# Patient Record
Sex: Male | Born: 1955 | Race: White | Hispanic: No | Marital: Married | Smoking: Former smoker
Health system: Southern US, Community
[De-identification: ages and names within clinical notes are randomized; demographics above are authoritative.]

## PROBLEM LIST (undated history)

## (undated) ENCOUNTER — Ambulatory Visit: Admission: EM | Payer: Medicare Other

## (undated) ENCOUNTER — Ambulatory Visit: Discharge status: Absent without leave | Payer: Medicare Other

## (undated) DIAGNOSIS — J439 Emphysema, unspecified: Secondary | ICD-10-CM

## (undated) DIAGNOSIS — I7090 Unspecified atherosclerosis: Secondary | ICD-10-CM

## (undated) DIAGNOSIS — J189 Pneumonia, unspecified organism: Secondary | ICD-10-CM

## (undated) DIAGNOSIS — I708 Atherosclerosis of other arteries: Secondary | ICD-10-CM

## (undated) HISTORY — PX: APPENDECTOMY: SHX54

## (undated) HISTORY — PX: KNEE SURGERY: SHX244

---

## 2004-06-12 ENCOUNTER — Emergency Department (HOSPITAL_COMMUNITY): Admission: EM | Admit: 2004-06-12 | Discharge: 2004-06-12 | Payer: Self-pay | Admitting: Emergency Medicine

## 2004-06-25 ENCOUNTER — Ambulatory Visit (HOSPITAL_COMMUNITY): Admission: RE | Admit: 2004-06-25 | Discharge: 2004-06-25 | Payer: Self-pay | Admitting: Orthopaedic Surgery

## 2004-07-28 ENCOUNTER — Ambulatory Visit (HOSPITAL_COMMUNITY): Admission: RE | Admit: 2004-07-28 | Discharge: 2004-07-28 | Payer: Self-pay | Admitting: Orthopaedic Surgery

## 2004-08-16 ENCOUNTER — Encounter: Admission: RE | Admit: 2004-08-16 | Discharge: 2004-11-14 | Payer: Self-pay | Admitting: Orthopaedic Surgery

## 2012-02-19 ENCOUNTER — Emergency Department (HOSPITAL_COMMUNITY)
Admission: EM | Admit: 2012-02-19 | Discharge: 2012-02-19 | Disposition: A | Discharge status: Home / Self Care | Payer: Self-pay | Attending: Emergency Medicine | Admitting: Emergency Medicine

## 2012-02-19 ENCOUNTER — Encounter (HOSPITAL_COMMUNITY): Payer: Self-pay

## 2012-02-19 DIAGNOSIS — Z8614 Personal history of Methicillin resistant Staphylococcus aureus infection: Secondary | ICD-10-CM | POA: Insufficient documentation

## 2012-02-19 DIAGNOSIS — L02419 Cutaneous abscess of limb, unspecified: Secondary | ICD-10-CM | POA: Insufficient documentation

## 2012-02-19 DIAGNOSIS — Z9889 Other specified postprocedural states: Secondary | ICD-10-CM | POA: Insufficient documentation

## 2012-02-19 DIAGNOSIS — L0291 Cutaneous abscess, unspecified: Secondary | ICD-10-CM

## 2012-02-19 DIAGNOSIS — L03119 Cellulitis of unspecified part of limb: Secondary | ICD-10-CM | POA: Insufficient documentation

## 2012-02-19 DIAGNOSIS — F17200 Nicotine dependence, unspecified, uncomplicated: Secondary | ICD-10-CM | POA: Insufficient documentation

## 2012-02-19 MED ORDER — OXYCODONE-ACETAMINOPHEN 5-325 MG PO TABS
2.0000 | ORAL_TABLET | Freq: Once | ORAL | Status: AC
  Administered 2012-02-19: 2 via ORAL
  Filled 2012-02-19: qty 2

## 2012-02-19 MED ORDER — OXYCODONE-ACETAMINOPHEN 5-325 MG PO TABS: 1.0000 | ORAL_TABLET | ORAL | Status: DC | PRN

## 2012-02-19 MED ORDER — SULFAMETHOXAZOLE-TRIMETHOPRIM 800-160 MG PO TABS: 1.0000 | ORAL_TABLET | Freq: Two times a day (BID) | ORAL | Status: DC

## 2012-02-19 MED ORDER — LIDOCAINE HCL (PF) 1 % IJ SOLN
5.0000 mL | Freq: Once | INTRAMUSCULAR | Status: DC
  Filled 2012-02-19: qty 5

## 2012-02-19 MED ORDER — SULFAMETHOXAZOLE-TMP DS 800-160 MG PO TABS
1.0000 | ORAL_TABLET | Freq: Once | ORAL | Status: AC
  Administered 2012-02-19: 1 via ORAL
  Filled 2012-02-19: qty 1

## 2012-02-19 NOTE — ED Notes (Signed)
Patient with no complaints at this time. Respirations even and unlabored. Skin warm/dry. Discharge instructions reviewed with patient at this time. Patient given opportunity to voice concerns/ask questions. Patient discharged at this time and left Emergency Department with steady gait.   

## 2012-02-19 NOTE — ED Notes (Signed)
Suture cart at bedside 

## 2012-02-19 NOTE — ED Provider Notes (Signed)
History     CSN: [id number]  Arrival date & time 02/19/12  2956   First MD Initiated Contact with Patient 02/19/12 [phone]      Chief Complaint  Patient presents with  . Knee Pain    (Consider location/radiation/quality/duration/timing/severity/associated sxs/prior treatment) HPI Comments: Patient c/o red, painful swollen area to the left knee.  States the area started as a small "bump" that progressed.  He denies previus hx of abscesses or MRSA, fever or difficulty bending  His knee.  Patient is a 57 y.o. male presenting with abscess. The history is provided by the patient.  Abscess  This is a new problem. The current episode started less than one week ago. The onset was gradual. The problem occurs continuously. The problem has been gradually worsening. Affected Location: left knee. The problem is moderate. The abscess is characterized by painfulness, swelling and redness. It is unknown what he was exposed to. Pertinent negatives include no decrease in physical activity, not sleeping less, no fever and no vomiting. His past medical history does not include skin abscesses in family. There were no sick contacts. He has received no recent medical care.    History reviewed. No pertinent past medical history.  Past Surgical History  Procedure Date  . Knee surgery   . Appendectomy     No family history on file.  History  Substance Use Topics  . Smoking status: Current Every Day Smoker  . Smokeless tobacco: Not on file  . Alcohol Use: No      Review of Systems  Constitutional: Negative for fever and chills.  Gastrointestinal: Negative for nausea and vomiting.  Musculoskeletal: Negative for joint swelling and arthralgias.  Skin: Positive for color change.       Abscess   Hematological: Negative for adenopathy.  All other systems reviewed and are negative.    Allergies  Review of patient's allergies indicates no known allergies.  Home Medications   Current Outpatient Rx    Name  Route  Sig  Dispense  Refill  . ACETAMINOPHEN 500 MG PO TABS   Oral   Take 1,000 mg by mouth every 6 (six) hours as needed. Pain           BP 124/76  Pulse 91  Temp 97.6 F (36.4 C) (Oral)  Resp 16  Ht 5\' 7"  (1.702 m)  Wt 143 lb (64.864 kg)  BMI 22.40 kg/m2  SpO2 97%  Physical Exam  Nursing note and vitals reviewed. Constitutional: He is oriented to person, place, and time. He appears well-developed and well-nourished. No distress.  HENT:  Head: Normocephalic and atraumatic.  Cardiovascular: Normal rate, regular rhythm and normal heart sounds.   Pulmonary/Chest: Effort normal and breath sounds normal.  Musculoskeletal: He exhibits tenderness.       Left knee: He exhibits swelling. He exhibits normal range of motion, no effusion, no ecchymosis, no deformity and no laceration. tenderness found.       Legs: Neurological: He is alert and oriented to person, place, and time. He exhibits normal muscle tone. Coordination normal.  Skin: Skin is warm.       See MS exam    ED Course  Procedures (including critical care time)   Labs Reviewed  CULTURE, ROUTINE-ABSCESS     Abscess culture is pending   MDM   INCISION AND DRAINAGE Performed by: Maxwell Caul. Consent: Verbal consent obtained. Risks and benefits: risks, benefits and alternatives were discussed Type: abscess  Body area: left knee Anesthesia: local infiltration  Incision was made with a #11 blade scalpel.  Local anesthetic: lidocaine 1% w/o epinephrine  Anesthetic total: 3 ml  Complexity: complex Blunt dissection to break up loculations  Drainage: purulent  Drainage amount: small  Packing material: 1/4 in iodoform gauze  Patient tolerance: Patient tolerated the procedure well with no immediate complications.     pt given percocet and Bactrim DS in the dept   Patient agrees to elevate his leg.  Minimal walking.  Return here in 2 days for recheck and packing  removal  Prescribed: Percocet #20 Bactrim DS   Frans Valente L. Kellee Sittner, [place] 02/21/12 1155

## 2012-02-19 NOTE — ED Notes (Signed)
Patient with what appears to be an abscess to left lateral knee.

## 2012-02-19 NOTE — ED Notes (Signed)
Pt was told he has "gout in my knees" knot to left knee present. No drainage present. Area red. Painful to touch. Ambulation does not affect knee

## 2012-02-21 NOTE — ED Provider Notes (Signed)
Medical screening examination/treatment/procedure(s) were performed by non-physician practitioner and as supervising physician I was immediately available for consultation/collaboration.   Shelda Jakes, MD 02/21/12 [phone]

## 2012-02-22 LAB — CULTURE, ROUTINE-ABSCESS

## 2012-02-22 NOTE — ED Notes (Signed)
Pt called to office to check on results of culture of knee. Informed of mrsa infection of wound, instructed in wound care and hand washing. States Dr Clarene Duke in [place] changed his med because he is allergic to sulfa. Instructed pt that I would fax copy of wound culture results and sensitivity to Dr Clarene Duke and he can follow up with him later today. Talked with Shanda Bumps at Dr Fredirick Maudlin [phone]) office and faxed report to [phone].

## 2012-02-25 ENCOUNTER — Encounter (HOSPITAL_COMMUNITY): Payer: Self-pay | Admitting: *Deleted

## 2012-02-25 ENCOUNTER — Emergency Department (HOSPITAL_COMMUNITY): Payer: Self-pay

## 2012-02-25 ENCOUNTER — Emergency Department (HOSPITAL_COMMUNITY)
Admission: EM | Admit: 2012-02-25 | Discharge: 2012-02-25 | Disposition: A | Discharge status: Home / Self Care | Payer: Self-pay | Attending: Emergency Medicine | Admitting: Emergency Medicine

## 2012-02-25 DIAGNOSIS — Z8614 Personal history of Methicillin resistant Staphylococcus aureus infection: Secondary | ICD-10-CM | POA: Insufficient documentation

## 2012-02-25 DIAGNOSIS — F17200 Nicotine dependence, unspecified, uncomplicated: Secondary | ICD-10-CM | POA: Insufficient documentation

## 2012-02-25 DIAGNOSIS — M25569 Pain in unspecified knee: Secondary | ICD-10-CM | POA: Insufficient documentation

## 2012-02-25 DIAGNOSIS — Z9889 Other specified postprocedural states: Secondary | ICD-10-CM | POA: Insufficient documentation

## 2012-02-25 DIAGNOSIS — L02419 Cutaneous abscess of limb, unspecified: Secondary | ICD-10-CM | POA: Insufficient documentation

## 2012-02-25 MED ORDER — OXYCODONE-ACETAMINOPHEN 5-325 MG PO TABS
2.0000 | ORAL_TABLET | Freq: Once | ORAL | Status: AC
  Administered 2012-02-25: 2 via ORAL
  Filled 2012-02-25: qty 2

## 2012-02-25 NOTE — ED Provider Notes (Signed)
History   This chart was scribed for non-physician practitioner working with Gerhard Munch, MD by Frederik Pear, ED Scribe. This patient was seen in room WTR8/WTR8 and the patient's care was started at 1919.   CSN: [id number]  Arrival date & time 02/25/12  1858   First MD Initiated Contact with Patient 02/25/12 1919      Chief Complaint  Patient presents with  . Recurrent Skin Infections    (Consider location/radiation/quality/duration/timing/severity/associated sxs/prior treatment) HPI  Aaron Stevens is a 57 y.o. male who presents to the Emergency Department with a chief complaint of a gradually worsening skin infection to the left nare that appeared recently. His wife reports that he went to his PCP, Dr. Clarene Duke, in [place] for an abscess on his right knee a few weeks ago and was diagnosed with gout. Then, an abscess appeared on the left knee a few days later. He was seen at [facility] in the ED 6 days ago by the PA and it was I&D'd. A culture determined that it was MRSA. He was given a course of sulfa antibiotics, but his wife reports that his lips began swelling after he began the medication so they returned to Dr. Clarene Duke after the results of the culture had been returned, and he switched the antibiotics. He reports that tonight in the ED he is still experiencing right-sided burning knee pain, and that the left knee abscess is still draining. His wife reports that he has not been taking pain medication and that they left the patient's pain medicine at Dr. Fredirick Maudlin office.  Dr. Clarene Duke also switched the patient's antibiotics.   History reviewed. No pertinent past medical history.  Past Surgical History  Procedure Date  . Knee surgery   . Appendectomy     History reviewed. No pertinent family history.  History  Substance Use Topics  . Smoking status: Current Every Day Smoker  . Smokeless tobacco: Not on file  . Alcohol Use: No      Review of Systems A complete 10 system  review of systems was obtained and all systems are negative except as noted in the HPI and PMH.   Allergies  Sulfa antibiotics  Home Medications   Current Outpatient Rx  Name  Route  Sig  Dispense  Refill  . ACETAMINOPHEN 500 MG PO TABS   Oral   Take 1,000 mg by mouth every 6 (six) hours as needed. Pain           BP 127/72  Pulse 88  Temp 97.7 F (36.5 C) (Oral)  Resp 16  SpO2 95%  Physical Exam  Nursing note and vitals reviewed. Constitutional: He is oriented to person, place, and time. He appears well-developed and well-nourished. No distress.  HENT:  Head: Normocephalic and atraumatic.  Eyes: EOM are normal. Pupils are equal, round, and reactive to light.  Neck: Normal range of motion. Neck supple. No tracheal deviation present.  Cardiovascular: Normal rate.   Pulmonary/Chest: Effort normal. No respiratory distress.  Abdominal: Soft. He exhibits no distension.  Musculoskeletal: Normal range of motion. He exhibits no edema.       Right knee well healing abscess on the medial aspect. No warmth, redness, signs of acute infection. Left knee 3x3 cm draining abscess on the lateral aspect.  Neurological: He is alert and oriented to person, place, and time.  Skin: Skin is warm and dry.  Psychiatric: He has a normal mood and affect. His behavior is normal.    ED Course  Procedures (  including critical care time)  DIAGNOSTIC STUDIES: Oxygen Saturation is 95% on room air, adequate by my interpretation.    COORDINATION OF CARE:  19:25- Discussed planned course of treatment with the patient, including Percocet and right knee X-rays, who is agreeable at this time.  Results for orders placed during the hospital encounter of 02/19/12  CULTURE, ROUTINE-ABSCESS      Component Value Range   Specimen Description ABSCESS LEFT KNEE     Special Requests NONE     Gram Stain       Value: NO WBC SEEN     RARE SQUAMOUS EPITHELIAL CELLS PRESENT     FEW GRAM POSITIVE COCCI IN PAIRS    Culture       Value: MODERATE METHICILLIN RESISTANT STAPHYLOCOCCUS AUREUS     Note: RIFAMPIN AND GENTAMICIN SHOULD NOT BE USED AS SINGLE DRUGS FOR TREATMENT OF STAPH INFECTIONS. This organism DOES NOT demonstrate inducible Clindamycin resistance in vitro. CRITICAL RESULT CALLED TO, READ BACK BY AND VERIFIED WITH: TONI FESTERMAN      02/22/12 1010 BY SMITHERSJ   Report Status 02/22/2012 FINAL     Organism ID, Bacteria METHICILLIN RESISTANT STAPHYLOCOCCUS AUREUS     Dg Knee Complete 4 Views Right  02/25/2012  *RADIOLOGY REPORT*  Clinical Data: 57 year old male with right knee pain and swelling.  RIGHT KNEE - COMPLETE 4+ VIEW  Comparison: None  Findings: There is no evidence of acute fracture, subluxation or dislocation. The joint spaces are unremarkable. There is no evidence of joint effusion. No focal bony lesions are present. A remote proximal fibular fracture is noted.  IMPRESSION: No evidence of acute abnormality.   Original Report Authenticated By: Harmon Pier, M.D.       1. Knee pain       MDM  57 year old male with knee pain. Also with recurrent skin infections. The abscess seemed to be healing well, will advise patient to continue his current medications. Advised the patient to followup with his primary care doctor tomorrow morning if he is still having pain. Patient still has an active prescription for Percocet, and according to hospital policy I will not give a refill of Percocet. Patient's pain has been managed here however.  I personally performed the services described in this documentation, which was scribed in my presence. The recorded information has been reviewed and is accurate.          Roxy Horseman, PA-C 02/25/12 2015

## 2012-02-25 NOTE — ED Provider Notes (Signed)
Medical screening examination/treatment/procedure(s) were performed by non-physician practitioner and as supervising physician I was immediately available for consultation/collaboration.  Keshona Kartes, MD 02/25/12 2345 

## 2012-02-25 NOTE — ED Notes (Signed)
Pt has a ride home.  

## 2012-02-25 NOTE — ED Notes (Addendum)
Patient with ? Skin abcess to his nose.  Patient was seen at [facility] and diagnosed with MRSA and his left knee was I&D.  Today, he is here because he has a possible same infection in his nose.  Patient is having a lot of pain and states that he was Rx oxycodone but left it at his MD's office this week when he was there this week.

## 2014-03-05 ENCOUNTER — Encounter (HOSPITAL_COMMUNITY): Payer: Self-pay | Admitting: *Deleted

## 2014-03-05 ENCOUNTER — Emergency Department (HOSPITAL_COMMUNITY): Payer: Worker's Compensation

## 2014-03-05 ENCOUNTER — Emergency Department (HOSPITAL_COMMUNITY)
Admission: EM | Admit: 2014-03-05 | Discharge: 2014-03-05 | Disposition: A | Discharge status: Home / Self Care | Payer: Worker's Compensation | Attending: Emergency Medicine | Admitting: Emergency Medicine

## 2014-03-05 DIAGNOSIS — S8991XA Unspecified injury of right lower leg, initial encounter: Secondary | ICD-10-CM | POA: Diagnosis not present

## 2014-03-05 DIAGNOSIS — W01198A Fall on same level from slipping, tripping and stumbling with subsequent striking against other object, initial encounter: Secondary | ICD-10-CM | POA: Insufficient documentation

## 2014-03-05 DIAGNOSIS — S42401A Unspecified fracture of lower end of right humerus, initial encounter for closed fracture: Secondary | ICD-10-CM

## 2014-03-05 DIAGNOSIS — Y9289 Other specified places as the place of occurrence of the external cause: Secondary | ICD-10-CM | POA: Insufficient documentation

## 2014-03-05 DIAGNOSIS — Y990 Civilian activity done for income or pay: Secondary | ICD-10-CM | POA: Diagnosis not present

## 2014-03-05 DIAGNOSIS — R200 Anesthesia of skin: Secondary | ICD-10-CM | POA: Insufficient documentation

## 2014-03-05 DIAGNOSIS — Z720 Tobacco use: Secondary | ICD-10-CM | POA: Diagnosis not present

## 2014-03-05 DIAGNOSIS — T148XXA Other injury of unspecified body region, initial encounter: Secondary | ICD-10-CM

## 2014-03-05 DIAGNOSIS — Y9389 Activity, other specified: Secondary | ICD-10-CM | POA: Insufficient documentation

## 2014-03-05 DIAGNOSIS — S59901A Unspecified injury of right elbow, initial encounter: Secondary | ICD-10-CM | POA: Diagnosis present

## 2014-03-05 MED ORDER — HYDROCODONE-ACETAMINOPHEN 5-325 MG PO TABS: 1.0000 | ORAL_TABLET | ORAL | Status: DC | PRN

## 2014-03-05 MED ORDER — HYDROCODONE-ACETAMINOPHEN 5-325 MG PO TABS
1.0000 | ORAL_TABLET | Freq: Once | ORAL | Status: AC
  Administered 2014-03-05: 1 via ORAL
  Filled 2014-03-05: qty 1

## 2014-03-05 NOTE — ED Notes (Signed)
Then pt fell tonight mat work [employer].  He fell in the freezer.  He has rt elbow and rt knee pain

## 2014-03-05 NOTE — ED Provider Notes (Signed)
CSN: [id number]     Arrival date & time 03/05/14  0155 History  This chart was scribed for Loren Raceravid Anhelica Fowers, MD by Bronson CurbJacqueline Melvin, ED Scribe. This patient was seen in room D34C/D34C and the patient's care was started at 2:10 AM.   Chief Complaint  Patient presents with  . Fall    The history is provided by the patient. No language interpreter was used.     HPI Comments: Aaron RoysRonnie Stevens is a 59 y.o. male, with no significant medical history, who presents to the Emergency Department complaining of a fall that occurred 2 hours ago. Patient states he was at work when he tripped and fell onto a hard surface, landing on his right elbow and right knee. He denies head injury or LOC. There is associated right elbow pain, right knee pain, and numbness in right wrist. He shoulder pain, neck pain, wrist pain, or any other injuries.   History reviewed. No pertinent past medical history. Past Surgical History  Procedure Laterality Date  . Knee surgery    . Appendectomy     No family history on file. History  Substance Use Topics  . Smoking status: Current Every Day Smoker  . Smokeless tobacco: Not on file  . Alcohol Use: No    Review of Systems  Constitutional: Negative for fever and chills.  Musculoskeletal: Positive for arthralgias. Negative for neck pain.  Neurological: Positive for numbness. Negative for syncope and headaches.  All other systems reviewed and are negative.     Allergies  Sulfa antibiotics  Home Medications   Prior to Admission medications   Medication Sig Start Date End Date Taking? Authorizing Provider  acetaminophen (TYLENOL) 500 MG tablet Take 1,000 mg by mouth every 6 (six) hours as needed. Pain    Historical Provider, MD  PRESCRIPTION MEDICATION antibiotic    Historical Provider, MD   Triage Vitals: BP 134/69 mmHg  Pulse 70  Temp(Src) 97.7 F (36.5 C)  Resp 18  Ht 5\' 6"  (1.676 m)  Wt 135 lb (61.236 kg)  BMI 21.80 kg/m2  SpO2 98%  Physical Exam   Constitutional: He is oriented to person, place, and time. He appears well-developed and well-nourished. No distress.  HENT:  Head: Normocephalic and atraumatic.  Eyes: Conjunctivae and EOM are normal.  Neck: Neck supple. No tracheal deviation present.  Cardiovascular: Normal rate.   Pulmonary/Chest: Effort normal. No respiratory distress.  Musculoskeletal: Normal range of motion.  Neurological: He is alert and oriented to person, place, and time.  Skin: Skin is warm and dry.  Psychiatric: He has a normal mood and affect. His behavior is normal.  Nursing note and vitals reviewed.   ED Course  Procedures (including critical care time)  DIAGNOSTIC STUDIES: Oxygen Saturation is 98% on room air, normal by my interpretation.    COORDINATION OF CARE: At 0212 Discussed treatment plan with patient which includes imaging. Patient agrees.   At 0302 Discussed with patient imaging results which revealed positive findings for avualsion fracture in the right elbow. Discussed treatment plan with patient which includes shoulder sling and ortho f/u. Patient agrees.  Labs Review Labs Reviewed - No data to display  Imaging Review Dg Elbow Complete Right  03/05/2014   CLINICAL DATA:  Larey SeatFell on concrete floor, acute injury, initial evaluation. Posterior elbow pain.  EXAM: RIGHT ELBOW - COMPLETE 3+ VIEW  COMPARISON:  None.  FINDINGS: Tiny bony fragment projects posterior to the olecranon, with underlying apparent donor site. No dislocation. No destructive bony lesions. Olecranon  soft tissue swelling without subcutaneous gas or radiopaque foreign bodies.  IMPRESSION: Tiny suspected avulsion fragment of the olecranon without dislocation.   Electronically Signed   By: Awilda Metro   On: 03/05/2014 02:45   Dg Knee 2 Views Right  03/05/2014   CLINICAL DATA:  Larey Seat on concrete floor, acute injury, initial evaluation. Anterior knee pain.  EXAM: RIGHT KNEE - 1-2 VIEW  COMPARISON:  RIGHT knee radiographs February 25, 2012  FINDINGS: There is no evidence of fracture, dislocation, or joint effusion. There is no evidence of arthropathy or other focal bone abnormality. Soft tissues are unremarkable.  IMPRESSION: Negative.   Electronically Signed   By: Awilda Metro   On: 03/05/2014 02:47     EKG Interpretation None      MDM   Final diagnoses:  None   I personally performed the services described in this documentation, which was scribed in my presence. The recorded information has been reviewed and is accurate.     Loren Racer, MD 03/12/14 [phone]

## 2014-03-05 NOTE — Discharge Instructions (Signed)
Call and make an appointment to follow-up with the orthopedist.  Elbow Fracture, Simple A fracture is a break in one of the bones.When fractures are not displaced or separated, they may be treated with only a sling or splint. The sling or splint may only be required for two to three weeks. In these cases, often the elbow is put through early range of motion exercises to prevent the elbow from getting stiff. DIAGNOSIS  The diagnosis (learning what is wrong) of a fractured elbow is made by x-ray. These may be required before and after the elbow is put into a splint or cast. X-rays are taken after to make sure the bone pieces have not moved. HOME CARE INSTRUCTIONS   Only take over-the-counter or prescription medicines for pain, discomfort, or fever as directed by your caregiver.  If you have a splint held on with an elastic wrap and your hand or fingers become numb or cold and blue, loosen the wrap and reapply more loosely. See your caregiver if there is no relief.  You may use ice for twenty minutes, four times per day, for the first two to three days.  Use your elbow as directed.  See your caregiver as directed. It is very important to keep all follow-up referrals and appointments in order to avoid any long-term problems with your elbow including chronic pain or stiffness. SEEK IMMEDIATE MEDICAL CARE IF:   There is swelling or increasing pain in elbow.  You begin to lose feeling or experience numbness or tingling in your hand or fingers.  You develop swelling of the hand and fingers.  You get a cold or blue hand or fingers on affected side. MAKE SURE YOU:   Understand these instructions.  Will watch your condition.  Will get help right away if you are not doing well or get worse. Document Released: 01/10/2001 Document Revised: 04/10/2011 Document Reviewed: 12/01/2008 [facility] Patient Information 2015 [employer], [place]. This information is not intended to replace advice given to you by  your health care provider. Make sure you discuss any questions you have with your health care provider.

## 2014-03-05 NOTE — ED Notes (Signed)
Patient is alert and orientedx4.  Patient was explained discharge instructions and they understood them with no questions.  The patient's wife, Kandace BlitzKaren Gero is taking the patient  Home.

## 2014-03-05 NOTE — ED Notes (Signed)
Family at bedside. 

## 2014-05-12 DIAGNOSIS — H2511 Age-related nuclear cataract, right eye: Secondary | ICD-10-CM | POA: Insufficient documentation

## 2015-01-31 ENCOUNTER — Emergency Department (HOSPITAL_COMMUNITY)
Admission: EM | Admit: 2015-01-31 | Discharge: 2015-01-31 | Disposition: A | Discharge status: Home / Self Care | Payer: Self-pay

## 2015-01-31 ENCOUNTER — Encounter (HOSPITAL_COMMUNITY): Payer: Self-pay | Admitting: Family Medicine

## 2015-01-31 ENCOUNTER — Emergency Department (HOSPITAL_COMMUNITY): Payer: 59

## 2015-01-31 ENCOUNTER — Emergency Department (HOSPITAL_COMMUNITY)
Admission: EM | Admit: 2015-01-31 | Discharge: 2015-01-31 | Disposition: A | Discharge status: Home / Self Care | Payer: 59 | Attending: Emergency Medicine | Admitting: Emergency Medicine

## 2015-01-31 DIAGNOSIS — F17210 Nicotine dependence, cigarettes, uncomplicated: Secondary | ICD-10-CM | POA: Diagnosis not present

## 2015-01-31 DIAGNOSIS — R52 Pain, unspecified: Secondary | ICD-10-CM

## 2015-01-31 DIAGNOSIS — M25511 Pain in right shoulder: Secondary | ICD-10-CM | POA: Insufficient documentation

## 2015-01-31 MED ORDER — DEXAMETHASONE SODIUM PHOSPHATE 10 MG/ML IJ SOLN
10.0000 mg | Freq: Once | INTRAMUSCULAR | Status: AC
Start: 2015-01-31 — End: 2015-01-31
  Administered 2015-01-31: 10 mg via INTRAMUSCULAR
  Filled 2015-01-31: qty 1

## 2015-01-31 MED ORDER — KETOROLAC TROMETHAMINE 30 MG/ML IJ SOLN: 30.0000 mg | Freq: Once | INTRAMUSCULAR | Status: DC

## 2015-01-31 MED ORDER — KETOROLAC TROMETHAMINE 30 MG/ML IJ SOLN
30.0000 mg | Freq: Once | INTRAMUSCULAR | Status: AC
  Administered 2015-01-31: 30 mg via INTRAMUSCULAR
  Filled 2015-01-31: qty 1

## 2015-01-31 MED ORDER — NAPROXEN 500 MG PO TABS: 500.0000 mg | ORAL_TABLET | Freq: Two times a day (BID) | ORAL | Status: DC

## 2015-01-31 NOTE — ED Provider Notes (Signed)
CSN: [id number]     Arrival date & time 01/31/15  1900 History  By signing my name below, I, Aaron Stevens, attest that this documentation has been prepared under the direction and in the presence of Cheri Fowler, PA-C. Electronically Signed: Phillis Stevens, ED Scribe. 01/31/2015. 8:00 PM.  Chief Complaint  Patient presents with  . Shoulder Pain   The history is provided by the patient. No language interpreter was used.   HPI Comments: Aaron Stevens is a 60 y.o. male with a hx of emphysema who presents to the Emergency Department complaining of aching, non-radiating, right shoulder pain that he rates 7/10 onset ~3-4 month ago, progressively worsening today. 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 Pt has been taking Tylenol Extra Strength to no relief. Pt has a PCP but has not been seen by PCP for this problem. He denies hx of similar symptoms. Pt denies fever, chills, chest pain, SOB, joint swelling, numbness, weakness, wound, color change, recent injury or fall.   PCP: Aida Puffer, MD  History reviewed. No pertinent past medical history. Past Surgical History  Procedure Laterality Date  . Knee surgery    . Appendectomy     History reviewed. No pertinent family history. Social History  Substance Use Topics  . Smoking status: Current Every Day Smoker -- 1.00 packs/day    Types: Cigarettes  . Smokeless tobacco: None  . Alcohol Use: No    Review of Systems  Constitutional: Negative for fever and chills.  Musculoskeletal: Positive for arthralgias. Negative for joint swelling.  Skin: Negative for color change and wound.  Neurological: Negative for weakness and numbness.   Allergies  Sulfa antibiotics  Home Medications   Prior to Admission medications   Medication Sig Start Date End Date Taking? Authorizing Provider  acetaminophen (TYLENOL) 500 MG tablet Take 1,000 mg by mouth every 6 (six) hours as needed. Pain    Historical Provider, MD   HYDROcodone-acetaminophen (NORCO) 5-325 MG per tablet Take 1 tablet by mouth every 4 (four) hours as needed. 03/05/14   Loren Racer, MD  naproxen (NAPROSYN) 500 MG tablet Take 1 tablet (500 mg total) by mouth 2 (two) times daily. 01/31/15   Cheri Fowler, PA-C  PRESCRIPTION MEDICATION antibiotic    Historical Provider, MD   BP 123/79 mmHg  Pulse 80  Temp(Src) 98 F (36.7 C) (Oral)  Resp 18  Ht 5\' 7"  (1.702 m)  Wt 65.772 kg  BMI 22.71 kg/m2  SpO2 99% Physical Exam  Constitutional: He is oriented to person, place, and time. He appears well-developed and well-nourished.  HENT:  Head: Atraumatic.  Eyes: Conjunctivae are normal.  Cardiovascular: Intact distal pulses.   Pulses:      Radial pulses are 2+ on the right side, and 2+ on the left side.  Capillary refill <3 seconds  Pulmonary/Chest: Effort normal and breath sounds normal.  Musculoskeletal:  Right shoulder:  No obvious deformities, ecchymosis, or abrasion.  No edema.  No crepitus.  No TTP along sternoclavicular joint, clavicle, coracoid process, or humerus.  Severe TTP along AC joint.  Compartment is soft and compressible.  No tenderness along long head of biceps or trapezius.  Full ROM to include forward flexion, extension, internal/external rotation, and abduction/adduction; however it does elicit pain.  No pain with resisted supination.  -Positive empty can test -Strength intact with external rotation and internal rotation -Positive Hawkin's   Neurological: He is alert and oriented to person, place, and time.  5/5  strength throughout.  Sensation intact.   Skin: Skin is warm and dry.    ED Course  Procedures (including critical care time) DIAGNOSTIC STUDIES: Oxygen Saturation is 99% on RA, normal by my interpretation.    COORDINATION OF CARE: 7:34 PM-Discussed treatment plan which includes x-ray with pt at bedside and pt agreed to plan.    Labs Review Labs Reviewed - No data to display  Imaging Review No results  found. I have personally reviewed and evaluated these images and lab results as part of my medical decision-making.   EKG Interpretation None      MDM   Final diagnoses:  Right shoulder pain    Patient presents with right shoulder pain for the past 3-4 months unrelieved by tylenol. He lifts 20-30 lb boxes at work, and lots of repetitive motions. No injury/trauma.  No numbness, weakness, tingling, fever, neck pain, CP, SOB.  VSS, NAD.  On exam, severe TTP along AC joint.  FAROM; however, painful.  Positive Hawkins and empty can test.  Strength intact in forward flexion, extension, abduction, adduction, and external/internal rotation.  Sensation intact.  Intact distal pulses.  High suspicion for rotator cuff impingement.  Will obtain plain films to assess for arthritis, joint integrity, or bony abnormality.  Toradol and decadron for pain.  Anticipate discharge home with Naprosyn and orthopedic follow up.  Care assumed by oncoming mid-level provider, Antony MaduraKelly Humes, PA-C, at shift change who will follow up on xrays and disposition.   I personally performed the services described in this documentation, which was scribed in my presence. The recorded information has been reviewed and is accurate.    Cheri FowlerKayla Michol Emory, PA-C 01/31/15 2000  Eber HongBrian Miller, MD 01/31/15 [phone]

## 2015-01-31 NOTE — ED Notes (Signed)
Patient is complaining of left shoulder pain. Pt states this pain has been "going on for a while". Denies any recent injury but does lift a lot of boxes for work.

## 2015-01-31 NOTE — Discharge Instructions (Signed)

## 2015-04-26 ENCOUNTER — Emergency Department (HOSPITAL_COMMUNITY): Payer: 59

## 2015-04-26 ENCOUNTER — Encounter (HOSPITAL_COMMUNITY): Payer: Self-pay | Admitting: Emergency Medicine

## 2015-04-26 ENCOUNTER — Emergency Department (HOSPITAL_COMMUNITY)
Admission: EM | Admit: 2015-04-26 | Discharge: 2015-04-26 | Disposition: A | Discharge status: Home / Self Care | Payer: 59 | Attending: Emergency Medicine | Admitting: Emergency Medicine

## 2015-04-26 DIAGNOSIS — R0789 Other chest pain: Secondary | ICD-10-CM | POA: Insufficient documentation

## 2015-04-26 DIAGNOSIS — F17210 Nicotine dependence, cigarettes, uncomplicated: Secondary | ICD-10-CM | POA: Diagnosis not present

## 2015-04-26 DIAGNOSIS — Z791 Long term (current) use of non-steroidal anti-inflammatories (NSAID): Secondary | ICD-10-CM | POA: Diagnosis not present

## 2015-04-26 DIAGNOSIS — R0781 Pleurodynia: Secondary | ICD-10-CM

## 2015-04-26 MED ORDER — TRAMADOL HCL 50 MG PO TABS: 50.0000 mg | ORAL_TABLET | Freq: Four times a day (QID) | ORAL | Status: DC | PRN

## 2015-04-26 MED ORDER — HYDROCODONE-ACETAMINOPHEN 5-325 MG PO TABS
1.0000 | ORAL_TABLET | Freq: Once | ORAL | Status: AC
  Administered 2015-04-26: 1 via ORAL
  Filled 2015-04-26: qty 1

## 2015-04-26 NOTE — Discharge Instructions (Signed)
Please read attached information. If you experience any new or worsening signs or symptoms please return to the emergency room for evaluation. Please follow-up with your primary care provider or specialist as discussed. Please use medication prescribed only as directed and discontinue taking if you have any concerning signs or symptoms.   °

## 2015-04-26 NOTE — ED Provider Notes (Signed)
CSN: [id number]     Arrival date & time 04/26/15  1441 History   First MD Initiated Contact with Patient 04/26/15 1801     Chief Complaint  Patient presents with  . Rib Injury    HPI   60 year old male presents today with complaints of left lateral rib injury. Patient reports that he was at work last night lifting boxes when he started to develop pain to the lateral ribs. He reports movement, palpation of the ribs causes excruciating pain. He reports pain is improved with rest. Patient reports that he occasionally has pain with deep inspiration. She denies any fever, cough, lower extremity swelling or edema, prolonged immobilization, active malignancy, or any other significant risk factors for DVT or PE. Patient denies any chest pain, shortness of breath, abdominal pain, nausea, vomiting, diarrhea, or any changes in the color clarity or characteristics of his urine. No history of the same    History reviewed. No pertinent past medical history. Past Surgical History  Procedure Laterality Date  . Knee surgery    . Appendectomy     History reviewed. No pertinent family history. Social History  Substance Use Topics  . Smoking status: Current Every Day Smoker -- 1.00 packs/day    Types: Cigarettes  . Smokeless tobacco: None  . Alcohol Use: No    Review of Systems  All other systems reviewed and are negative.   Allergies  Sulfa antibiotics and Bee venom  Home Medications   Prior to Admission medications   Medication Sig Start Date End Date Taking? Authorizing Provider  acetaminophen (TYLENOL) 500 MG tablet Take 1,000 mg by mouth every 6 (six) hours as needed. Pain    Historical Provider, MD  HYDROcodone-acetaminophen (NORCO) 5-325 MG per tablet Take 1 tablet by mouth every 4 (four) hours as needed. 03/05/14   Loren Racer, MD  naproxen (NAPROSYN) 500 MG tablet Take 1 tablet (500 mg total) by mouth 2 (two) times daily. 01/31/15   Cheri Fowler, PA-C  PRESCRIPTION MEDICATION antibiotic     Historical Provider, MD  traMADol (ULTRAM) 50 MG tablet Take 1 tablet (50 mg total) by mouth every 6 (six) hours as needed. 04/26/15   Gerianne Simonet, PA-C   BP 109/63 mmHg  Pulse 62  Temp(Src) 97.8 F (36.6 C) (Oral)  Resp 13  SpO2 99%   Physical Exam  Constitutional: He is oriented to person, place, and time. He appears well-developed and well-nourished.  HENT:  Head: Normocephalic and atraumatic.  Eyes: Conjunctivae are normal. Pupils are equal, round, and reactive to light. Right eye exhibits no discharge. Left eye exhibits no discharge. No scleral icterus.  Neck: Normal range of motion. No JVD present. No tracheal deviation present.  Pulmonary/Chest: Effort normal and breath sounds normal. No stridor. No respiratory distress. He has no wheezes. He has no rales. He exhibits no tenderness.  No signs of trauma, no rash, no bruising. Significant tenderness palpation of the left lower lateral ribs, no pain with AP compression of the chest  Abdominal: Soft. He exhibits no distension and no mass. There is no tenderness. There is no rebound and no guarding.  Neurological: He is alert and oriented to person, place, and time. Coordination normal.  Skin: Skin is warm and dry. No rash noted. No erythema. No pallor.  Psychiatric: He has a normal mood and affect. His behavior is normal. Judgment and thought content normal.  Nursing note and vitals reviewed.   ED Course  Procedures (including critical care time) Labs Review Labs  Reviewed - No data to display  Imaging Review Dg Ribs Unilateral W/chest Left  04/26/2015  CLINICAL DATA:  60 year old male with left lower lateral rib pain. EXAM: LEFT RIBS AND CHEST - 3+ VIEW COMPARISON:  None. FINDINGS: No fracture or other bone lesions are seen involving the ribs. There is no evidence of pneumothorax or pleural effusion. Both lungs are clear. Heart size and mediastinal contours are within normal limits. IMPRESSION: No rib fracture or pneumothorax.  Electronically Signed   By: Elgie CollardArash  Radparvar M.D.   On: 04/26/2015 19:05   I have personally reviewed and evaluated these images and lab results as part of my medical decision-making.   EKG Interpretation None      MDM   Final diagnoses:  Rib pain on left side    Labs:  Imaging: DG ribs unilateral chest- Negative  Consults:  Therapeutics:  Discharge Meds:   Assessment/Plan: [age]-year-old male presents today with likely costochondritis. Patient has pain with palpation, lung sounds are clear, no signs of infection, very low suspicion for pulmonary embolism in this patient. Patient instructed to use Tylenol or ibuprofen, Ultram as needed, avoid any heavy lifting, maintained deep inspirations, follow-up with primary care if symptoms continue to persist, return to the ED if they worsen. He verbalized understanding and agreement today's plan had no further questions or concerns at time of discharge        Eyvonne MechanicJeffrey Dijon Kohlman, PA-C 04/26/15 1918  Lorre NickAnthony Allen, MD 04/29/15 [phone]

## 2015-04-26 NOTE — ED Notes (Signed)
Pt c/o left lower lateral rib cage pain onset yesterday. Denies injury. Pt is heavy smoker, has chronic emphysema with chronic cough. Lung sounds normal.

## 2016-07-11 ENCOUNTER — Ambulatory Visit (HOSPITAL_COMMUNITY)
Admission: EM | Admit: 2016-07-11 | Discharge: 2016-07-11 | Disposition: A | Discharge status: Home / Self Care | Payer: 59 | Attending: Emergency Medicine | Admitting: Emergency Medicine

## 2016-07-11 ENCOUNTER — Encounter (HOSPITAL_COMMUNITY): Payer: Self-pay | Admitting: *Deleted

## 2016-07-11 ENCOUNTER — Ambulatory Visit (INDEPENDENT_AMBULATORY_CARE_PROVIDER_SITE_OTHER): Payer: Self-pay

## 2016-07-11 DIAGNOSIS — R1111 Vomiting without nausea: Secondary | ICD-10-CM

## 2016-07-11 DIAGNOSIS — R05 Cough: Secondary | ICD-10-CM | POA: Diagnosis not present

## 2016-07-11 DIAGNOSIS — J181 Lobar pneumonia, unspecified organism: Secondary | ICD-10-CM | POA: Diagnosis not present

## 2016-07-11 DIAGNOSIS — J189 Pneumonia, unspecified organism: Secondary | ICD-10-CM

## 2016-07-11 DIAGNOSIS — R059 Cough, unspecified: Secondary | ICD-10-CM

## 2016-07-11 MED ORDER — PREDNISONE 10 MG (21) PO TBPK: ORAL_TABLET | Freq: Every day | ORAL | 0 refills | Status: DC

## 2016-07-11 MED ORDER — AZITHROMYCIN 250 MG PO TABS: 250.0000 mg | ORAL_TABLET | Freq: Every day | ORAL | 0 refills | Status: DC

## 2016-07-11 MED ORDER — ALBUTEROL SULFATE HFA 108 (90 BASE) MCG/ACT IN AERS: 1.0000 | INHALATION_SPRAY | Freq: Four times a day (QID) | RESPIRATORY_TRACT | 0 refills | Status: DC | PRN

## 2016-07-11 MED ORDER — AMOXICILLIN 500 MG PO CAPS: 1000.0000 mg | ORAL_CAPSULE | Freq: Three times a day (TID) | ORAL | 0 refills | Status: DC

## 2016-07-11 NOTE — Discharge Instructions (Signed)
You will need to return to your pcp or return here to have a follow up chest x ray post abx treatment  Take all the abx even if you start to feel better  Decrease or stop smoking will help with sx  Use inhaler as needed If sx become worse you will need to go to the er

## 2016-07-11 NOTE — ED Provider Notes (Signed)
CSN: [id number]     Arrival date & time 07/11/16  1015 History   First MD Initiated Contact with Patient 07/11/16 1127     Chief Complaint  Patient presents with  . Cough   (Consider location/radiation/quality/duration/timing/severity/associated sxs/prior Treatment) C/o cough for 1 week getting worse and causing him to have emesis x3 daily. He smokes 1 pack daily, hx copd, some intermit sob. States that he is just not feeling well. Denies any chest pain, no swelling to lower extremities. Has not taken anything pta. Talking in full sentences.       History reviewed. No pertinent past medical history. Past Surgical History:  Procedure Laterality Date  . APPENDECTOMY    . KNEE SURGERY     History reviewed. No pertinent family history. Social History  Substance Use Topics  . Smoking status: Current Every Day Smoker    Packs/day: 1.00    Types: Cigarettes  . Smokeless tobacco: Never Used  . Alcohol use No    Review of Systems  Constitutional: Positive for fatigue.  HENT: Negative.   Eyes: Negative.   Respiratory: Positive for cough, shortness of breath and wheezing.   Cardiovascular: Negative.   Gastrointestinal: Positive for nausea and vomiting.  Genitourinary: Negative.   Musculoskeletal: Negative.   Skin: Negative.   Neurological: Negative.     Allergies  Sulfa antibiotics and Bee venom  Home Medications   Prior to Admission medications   Medication Sig Start Date End Date Taking? Authorizing Provider  acetaminophen (TYLENOL) 500 MG tablet Take 1,000 mg by mouth every 6 (six) hours as needed. Pain    [provider]  albuterol (PROVENTIL HFA;VENTOLIN HFA) 108 (90 Base) MCG/ACT inhaler Inhale 1-2 puffs into the lungs every 6 (six) hours as needed for wheezing or shortness of breath. 07/11/16   Tobi BastosMitchell, Genae Strine A, NP  amoxicillin (AMOXIL) 500 MG capsule Take 2 capsules (1,000 mg total) by mouth 3 (three) times daily. 07/11/16   Tobi BastosMitchell, Bunny Kleist A, NP   azithromycin (ZITHROMAX) 250 MG tablet Take 1 tablet (250 mg total) by mouth daily. Take first 2 tablets together, then 1 every day until finished. 07/11/16   Tobi BastosMitchell, Rykin Route A, NP  HYDROcodone-acetaminophen (NORCO) 5-325 MG per tablet Take 1 tablet by mouth every 4 (four) hours as needed. 03/05/14   Loren RacerYelverton, David, MD  naproxen (NAPROSYN) 500 MG tablet Take 1 tablet (500 mg total) by mouth 2 (two) times daily. 01/31/15   Cheri Fowlerose, Kayla, PA-C  predniSONE (STERAPRED UNI-PAK 21 TAB) 10 MG (21) TBPK tablet Take by mouth daily. Take 6 tabs by mouth daily  for 2 days, then 5 tabs for 2 days, then 4 tabs for 2 days, then 3 tabs for 2 days, 2 tabs for 2 days, then 1 tab by mouth daily for 2 days 07/11/16   Tobi BastosMitchell, Mischelle Reeg A, NP  PRESCRIPTION MEDICATION antibiotic    [provider]  traMADol (ULTRAM) 50 MG tablet Take 1 tablet (50 mg total) by mouth every 6 (six) hours as needed. 04/26/15   Hedges, Tinnie GensJeffrey, PA-C   Meds Ordered and Administered this Visit  Medications - No data to display  BP 140/82 (BP Location: Right Arm)   Pulse 80   Temp 98.6 F (37 C) (Oral)   Resp 18   SpO2 97%  No data found.   Physical Exam  Constitutional: He appears well-developed.  HENT:  Head: Normocephalic.  Right Ear: External ear normal.  Left Ear: External ear normal.  Mouth/Throat: Oropharynx is clear and moist.  Eyes: Pupils are equal, round, and reactive to light.  Neck: Normal range of motion.  Cardiovascular: Normal rate.   Pulmonary/Chest: He has wheezes.  diminished bs to LT lower base, wheezing and rales though out.   Abdominal: Soft. Bowel sounds are normal.  Neurological: He is alert.  Skin: Skin is warm. Capillary refill takes less than 2 seconds.    Urgent Care Course     Procedures (including critical care time)  Labs Review Labs Reviewed - No data to display  Imaging Review Dg Chest 2 View  Result Date: 07/11/2016 CLINICAL DATA:  Cough for 2 weeks.  Shortness of breath.  EXAM: CHEST  2 VIEW COMPARISON:  April 26, 2015 FINDINGS: There is focal airspace consolidation in a portion of the right middle lobe. The lungs elsewhere are clear. Heart size and pulmonary vascularity are normal. No adenopathy. No bone lesions. IMPRESSION: Airspace consolidation in a portion of the right middle lobe consistent with pneumonia. Lungs elsewhere clear. Cardiac silhouette within normal limits. Followup PA and lateral chest radiographs recommended in 3-4 weeks following trial of antibiotic therapy to ensure resolution and exclude underlying malignancy. These results will be called to the ordering clinician or representative by the Radiologist Assistant, and communication documented in the PACS or zVision Dashboard. Electronically Signed   By: Bretta Bang III M.D.   On: 07/11/2016 11:45             MDM   1. Cough   2. Non-intractable vomiting without nausea, unspecified vomiting type   3. Community acquired pneumonia of left lower lobe of lung (HCC)    You will need to return to your pcp or return here to have a follow up chest x ray post abx treatment  Take all the abx even if you start to feel better  Decrease or stop smoking will help with sx  Use inhaler as needed If sx become worse you will need to go to the er  Reviewed curb 65 score of 0     Tobi Bastos, NP 07/11/16 1211

## 2016-07-11 NOTE — ED Triage Notes (Signed)
Pt  Reports  Cough    /   Congested    With    Phlegm       Production     Symptoms      X   Several  Weeks    Pt  Reports  Feels  Weak  As   Well

## 2016-09-28 ENCOUNTER — Encounter (HOSPITAL_COMMUNITY): Payer: Self-pay

## 2016-09-28 ENCOUNTER — Emergency Department (HOSPITAL_COMMUNITY): Payer: 59

## 2016-09-28 ENCOUNTER — Emergency Department (HOSPITAL_COMMUNITY)
Admission: EM | Admit: 2016-09-28 | Discharge: 2016-09-28 | Disposition: A | Discharge status: Home / Self Care | Payer: 59 | Attending: Emergency Medicine | Admitting: Emergency Medicine

## 2016-09-28 DIAGNOSIS — J439 Emphysema, unspecified: Secondary | ICD-10-CM

## 2016-09-28 DIAGNOSIS — J438 Other emphysema: Secondary | ICD-10-CM | POA: Insufficient documentation

## 2016-09-28 DIAGNOSIS — R079 Chest pain, unspecified: Secondary | ICD-10-CM

## 2016-09-28 DIAGNOSIS — Z79899 Other long term (current) drug therapy: Secondary | ICD-10-CM | POA: Insufficient documentation

## 2016-09-28 DIAGNOSIS — F17210 Nicotine dependence, cigarettes, uncomplicated: Secondary | ICD-10-CM | POA: Insufficient documentation

## 2016-09-28 HISTORY — DX: Pneumonia, unspecified organism: J18.9

## 2016-09-28 HISTORY — DX: Atherosclerosis of other arteries: I70.8

## 2016-09-28 HISTORY — DX: Emphysema, unspecified: J43.9

## 2016-09-28 HISTORY — DX: Unspecified atherosclerosis: I70.90

## 2016-09-28 LAB — BASIC METABOLIC PANEL
Anion gap: 8 (ref 5–15)
BUN: 14 mg/dL (ref 6–20)
CO2: 25 mmol/L (ref 22–32)
Calcium: 9.1 mg/dL (ref 8.9–10.3)
Chloride: 107 mmol/L (ref 101–111)
Creatinine, Ser: 1.03 mg/dL (ref 0.61–1.24)
GFR calc Af Amer: >60 mL/min (ref >60)
GFR calc non Af Amer: >60 mL/min (ref >60)
Glucose, Bld: 87 mg/dL (ref 65–99)
Potassium: 4.3 mmol/L (ref 3.5–5.1)
Sodium: 140 mmol/L (ref 135–145)

## 2016-09-28 LAB — CBC
HCT: 41.9 % (ref 39.0–52.0)
Hemoglobin: 13.9 g/dL (ref 13.0–17.0)
MCH: 30.3 pg (ref 26.0–34.0)
MCHC: 33.2 g/dL (ref 30.0–36.0)
MCV: 91.3 fL (ref 78.0–100.0)
Platelets: 285 10*3/uL (ref 150–400)
RBC: 4.59 MIL/uL (ref 4.22–5.81)
RDW: 15.1 % (ref 11.5–15.5)
WBC: 9.8 10*3/uL (ref 4.0–10.5)

## 2016-09-28 LAB — I-STAT TROPONIN, ED: Troponin i, poc: 0 ng/mL (ref 0.00–0.08)

## 2016-09-28 MED ORDER — METHYLPREDNISOLONE 4 MG PO TBPK: ORAL_TABLET | ORAL | 0 refills | Status: DC

## 2016-09-28 MED ORDER — ALBUTEROL SULFATE HFA 108 (90 BASE) MCG/ACT IN AERS: 2.0000 | INHALATION_SPRAY | RESPIRATORY_TRACT | 3 refills | Status: DC | PRN

## 2016-09-28 MED ORDER — AZITHROMYCIN 250 MG PO TABS: ORAL_TABLET | ORAL | 0 refills | Status: DC

## 2016-09-28 MED ORDER — IPRATROPIUM-ALBUTEROL 0.5-2.5 (3) MG/3ML IN SOLN
3.0000 mL | Freq: Once | RESPIRATORY_TRACT | Status: AC
  Administered 2016-09-28: 3 mL via RESPIRATORY_TRACT
  Filled 2016-09-28: qty 3

## 2016-09-28 NOTE — Discharge Instructions (Signed)

## 2016-09-28 NOTE — ED Triage Notes (Signed)
Per [facility], Pt is coming from home with complaints about 0730 with sudden onset of sharp, non-radiatin left sided chest pain. Denies increased SOB. Pt had no N/V, weakness, or diaphoresis. When EMS arrived, pt had subsided, but pain is intermittent and will return. Pt is alert and oriented x 4. Pt has hx of cough x 3 weeks with hx of PNA. Vitals per EMS: 142/78, 91 HR, 97% on RA   Pt was given 324 mg of ASA. 20 gauge in Left Hand.

## 2016-09-28 NOTE — ED Notes (Signed)
Pt went to X-ray.   

## 2016-09-28 NOTE — ED Notes (Signed)
Pt finished with breathing treatment and waiting for reassessment.

## 2016-09-28 NOTE — ED Notes (Signed)
Patient transported to X-ray 

## 2016-09-28 NOTE — ED Provider Notes (Signed)
[facility]-EMERGENCY DEPT Provider Note   CSN: [id number] Arrival date & time: 09/28/16  0902     History   Chief Complaint Chief Complaint  Patient presents with  . Chest Pain    HPI Aaron Stevens is a 61 y.o. male.past medical history of emphysema, COPD, atherosclerosis, daily smoker who presents the emergency department with chief complaint of sharp chest pain. Patient states that earlier this morning he had onset of sudden sharp left-sided chest pain lasting almost the second. It went away and has not returned. The patient did have a previous right middle lobe pneumonia. Was treated. The patient denies nausea, vomiting, diaphoresis. He is having some increased work of breathing and wheezing. He states he has not been taking any inhalers. He denies any current or re-current chest pain.  HPI  Past Medical History:  Diagnosis Date  . Atherosclerosis of artery   . Emphysema of lung (HCC)   . Pneumonia     There are no active problems to display for this patient.   Past Surgical History:  Procedure Laterality Date  . APPENDECTOMY    . KNEE SURGERY         Home Medications    Prior to Admission medications   Medication Sig Start Date End Date Taking? Authorizing Provider  acetaminophen (TYLENOL) 500 MG tablet Take 1,000 mg by mouth every 6 (six) hours as needed. Pain    [provider]  albuterol (PROVENTIL HFA;VENTOLIN HFA) 108 (90 Base) MCG/ACT inhaler Inhale 1-2 puffs into the lungs every 6 (six) hours as needed for wheezing or shortness of breath. 07/11/16   Tobi Bastos, NP  amoxicillin (AMOXIL) 500 MG capsule Take 2 capsules (1,000 mg total) by mouth 3 (three) times daily. 07/11/16   Tobi Bastos, NP  azithromycin (ZITHROMAX) 250 MG tablet Take 1 tablet (250 mg total) by mouth daily. Take first 2 tablets together, then 1 every day until finished. 07/11/16   Tobi Bastos, NP  HYDROcodone-acetaminophen (NORCO) 5-325 MG per tablet Take 1 tablet  by mouth every 4 (four) hours as needed. 03/05/14   Loren Racer, MD  naproxen (NAPROSYN) 500 MG tablet Take 1 tablet (500 mg total) by mouth 2 (two) times daily. 01/31/15   Cheri Fowler, PA-C  predniSONE (STERAPRED UNI-PAK 21 TAB) 10 MG (21) TBPK tablet Take by mouth daily. Take 6 tabs by mouth daily  for 2 days, then 5 tabs for 2 days, then 4 tabs for 2 days, then 3 tabs for 2 days, 2 tabs for 2 days, then 1 tab by mouth daily for 2 days 07/11/16   Tobi Bastos, NP  PRESCRIPTION MEDICATION antibiotic    [provider]  traMADol (ULTRAM) 50 MG tablet Take 1 tablet (50 mg total) by mouth every 6 (six) hours as needed. 04/26/15   Eyvonne Mechanic, PA-C    Family History No family history on file.  Social History Social History  Substance Use Topics  . Smoking status: Current Every Day Smoker    Packs/day: 1.00    Types: Cigarettes  . Smokeless tobacco: Never Used  . Alcohol use No     Allergies   Sulfa antibiotics and Bee venom   Review of Systems Review of Systems  Ten systems reviewed and are negative for acute change, except as noted in the HPI.   Physical Exam Updated Vital Signs Pulse 60   Temp 97.8 F (36.6 C) (Oral)   Resp 17   Ht 5\' 7"  (1.702 m)  Wt 63.5 kg (140 lb)   SpO2 98%   BMI 21.93 kg/m   Physical Exam  Constitutional: He appears well-developed and well-nourished. No distress.  HENT:  Head: Normocephalic and atraumatic.  Eyes: Pupils are equal, round, and reactive to light. Conjunctivae and EOM are normal. No scleral icterus.  Neck: Normal range of motion. Neck supple.  Cardiovascular: Normal rate, regular rhythm and normal heart sounds.   Pulmonary/Chest: No respiratory distress. He has wheezes.  [name] with body habitus of advanced emphysematous COPD, hyperexpanded lungs.  Abdominal: Soft. There is no tenderness.  Musculoskeletal: He exhibits no edema.  Neurological: He is alert.  Skin: Skin is warm and  dry. He is not diaphoretic.  Psychiatric: His behavior is normal.  Nursing note and vitals reviewed.    ED Treatments / Results  Labs (all labs ordered are listed, but only abnormal results are displayed) Labs Reviewed  BASIC METABOLIC PANEL  CBC  I-STAT TROPONIN, ED    EKG  EKG Interpretation None       Radiology Dg Chest 2 View  Result Date: 09/28/2016 CLINICAL DATA:  Chest pain since yesterday.  Smoker. EXAM: CHEST  2 VIEW COMPARISON:  07/11/2016. FINDINGS: Normal sized heart. Clear lungs with normal vascularity. Resolved right middle lobe airspace opacity. The lungs are hyperexpanded. Mild thoracic spine degenerative changes. IMPRESSION: No acute abnormality.  COPD. Electronically Signed   By: Beckie SaltsSteven  Reid M.D.   On: 09/28/2016 09:37    Procedures Procedures (including critical care time)  Medications Ordered in ED Medications - No data to display   Initial Impression / Assessment and Plan / ED Course  I have reviewed the triage vital signs and the nursing notes.  Pertinent labs & imaging results that were available during my care of the patient were reviewed by me and considered in my medical decision making (see chart for details).     Patient without EKG changes, negative troponin, lab work is normal. Suspect mild COPD exacerbation. I do not feel that fleeting sharp chest pain which has not recurred represents in anyway pulmonary embolus or acute coronary syndrome. Patient was given a DuoNeb treatment and will be discharged with a short course of steroids, inhaler and azithromycin.  Final Clinical Impressions(s) / ED Diagnoses   Final diagnoses:  None    New Prescriptions New Prescriptions   No medications on file     Arthor CaptainHarris, Izaiah Tabb, PA-C 09/28/16 1238    Raeford RazorKohut, Stephen, MD 10/04/16 [phone]

## 2017-03-26 ENCOUNTER — Emergency Department (HOSPITAL_COMMUNITY): Payer: 59

## 2017-03-26 ENCOUNTER — Emergency Department (HOSPITAL_COMMUNITY)
Admission: EM | Admit: 2017-03-26 | Discharge: 2017-03-26 | Disposition: A | Discharge status: Home / Self Care | Payer: 59 | Attending: Emergency Medicine | Admitting: Emergency Medicine

## 2017-03-26 ENCOUNTER — Encounter (HOSPITAL_COMMUNITY): Payer: Self-pay | Admitting: Emergency Medicine

## 2017-03-26 ENCOUNTER — Other Ambulatory Visit: Payer: Self-pay

## 2017-03-26 DIAGNOSIS — Y990 Civilian activity done for income or pay: Secondary | ICD-10-CM | POA: Insufficient documentation

## 2017-03-26 DIAGNOSIS — W208XXA Other cause of strike by thrown, projected or falling object, initial encounter: Secondary | ICD-10-CM | POA: Insufficient documentation

## 2017-03-26 DIAGNOSIS — Y9289 Other specified places as the place of occurrence of the external cause: Secondary | ICD-10-CM | POA: Insufficient documentation

## 2017-03-26 DIAGNOSIS — S92311A Displaced fracture of first metatarsal bone, right foot, initial encounter for closed fracture: Secondary | ICD-10-CM | POA: Insufficient documentation

## 2017-03-26 DIAGNOSIS — Y9389 Activity, other specified: Secondary | ICD-10-CM | POA: Insufficient documentation

## 2017-03-26 DIAGNOSIS — F17210 Nicotine dependence, cigarettes, uncomplicated: Secondary | ICD-10-CM | POA: Insufficient documentation

## 2017-03-26 DIAGNOSIS — Z79899 Other long term (current) drug therapy: Secondary | ICD-10-CM | POA: Insufficient documentation

## 2017-03-26 MED ORDER — OXYCODONE-ACETAMINOPHEN 5-325 MG PO TABS
1.0000 | ORAL_TABLET | ORAL | Status: DC | PRN
  Administered 2017-03-26: 1 via ORAL
  Filled 2017-03-26: qty 1

## 2017-03-26 MED ORDER — OXYCODONE-ACETAMINOPHEN 5-325 MG PO TABS: 1.0000 | ORAL_TABLET | Freq: Four times a day (QID) | ORAL | 0 refills | Status: DC | PRN

## 2017-03-26 MED ORDER — SENNOSIDES-DOCUSATE SODIUM 8.6-50 MG PO TABS: 1.0000 | ORAL_TABLET | Freq: Every day | ORAL | 0 refills | Status: DC

## 2017-03-26 MED ORDER — OXYCODONE-ACETAMINOPHEN 5-325 MG PO TABS
1.0000 | ORAL_TABLET | Freq: Once | ORAL | Status: AC
  Administered 2017-03-26: 1 via ORAL
  Filled 2017-03-26: qty 1

## 2017-03-26 MED ORDER — OXYCODONE-ACETAMINOPHEN 5-325 MG PO TABS: 2.0000 | ORAL_TABLET | Freq: Four times a day (QID) | ORAL | 0 refills | Status: DC | PRN

## 2017-03-26 NOTE — Discharge Instructions (Signed)
You were given Percocet to help manage your pain.  You may take 2 tablets every 6 hours as needed for severe pain.  Do not drive, work, or operate heavy machinery while taking this medication.  You are also given a stool softener to take while you are taking this pain medication, as this pain medication can make you have constipation.  You may take 1 daily for up to 5 days.  Make sure to speak with the pharmacist about potential drug interactions since you have been given new medications today.  If you have any potential side effects from these medications that you are not able to tolerate, he should speak with the pharmacist and/or discontinue these medications.  You need to call the orthopedic office tomorrow to schedule appointment with 1-3 days.  Please return to the emergency department for any new or worsening symptoms.

## 2017-03-26 NOTE — ED Provider Notes (Signed)
[facility]-EMERGENCY DEPT Provider Note   CSN: [id number] Arrival date & time: 03/26/17  1847     History   Chief Complaint Chief Complaint  Patient presents with  . Foot Pain    HPI Aaron Stevens is a 62 y.o. male.  HPI   Pt is a 62 y/o male who presents to the ED today c/o 8/10 throbbing right foot pain that began about 3:00pm today when a large pile of lumber fell onto his foot today while he was at work. States he was wearing boots at the time of the event. States he has been unable to bear weight on the foot since this occured. Reports pain to the entire foot, worse to the medial aspect of the dorsal side of the foot. States he has feeling to the plantar surface of foot, but reports paresthesias to the dorsal aspect. Received percocet prior to being seen and pain imrpoved somewhat since then. Denies any pain in the ankle. Denies any other injuries.  Past Medical History:  Diagnosis Date  . Atherosclerosis of artery   . Emphysema of lung (HCC)   . Pneumonia     There are no active problems to display for this patient.   Past Surgical History:  Procedure Laterality Date  . APPENDECTOMY    . KNEE SURGERY         Home Medications    Prior to Admission medications   Medication Sig Start Date End Date Taking? Authorizing Provider  acetaminophen (TYLENOL) 500 MG tablet Take 1,000 mg by mouth every 6 (six) hours as needed. Pain    [provider]  albuterol (PROVENTIL HFA;VENTOLIN HFA) 108 (90 Base) MCG/ACT inhaler Inhale 1-2 puffs into the lungs every 6 (six) hours as needed for wheezing or shortness of breath. 07/11/16   Coralyn MarkMitchell, Melanie L, NP  albuterol (PROVENTIL HFA;VENTOLIN HFA) 108 (90 Base) MCG/ACT inhaler Inhale 2 puffs into the lungs every 4 (four) hours as needed for wheezing or shortness of breath. 09/28/16   Arthor CaptainHarris, Abigail, PA-C  amoxicillin (AMOXIL) 500 MG capsule Take 2 capsules (1,000 mg total) by mouth 3 (three) times  daily. 07/11/16   Coralyn MarkMitchell, Melanie L, NP  azithromycin (ZITHROMAX Z-PAK) 250 MG tablet 2 po day one, then 1 daily x 4 days 09/28/16   Arthor CaptainHarris, Abigail, PA-C  azithromycin (ZITHROMAX) 250 MG tablet Take 1 tablet (250 mg total) by mouth daily. Take first 2 tablets together, then 1 every day until finished. 07/11/16   Coralyn MarkMitchell, Melanie L, NP  HYDROcodone-acetaminophen (NORCO) 5-325 MG per tablet Take 1 tablet by mouth every 4 (four) hours as needed. 03/05/14   Loren RacerYelverton, David, MD  methylPREDNISolone (MEDROL DOSEPAK) 4 MG TBPK tablet Use as directed 09/28/16   Arthor CaptainHarris, Abigail, PA-C  naproxen (NAPROSYN) 500 MG tablet Take 1 tablet (500 mg total) by mouth 2 (two) times daily. 01/31/15   Cheri Fowlerose, Kayla, PA-C  oxyCODONE-acetaminophen (PERCOCET/ROXICET) 5-325 MG tablet Take 1 tablet by mouth every 6 (six) hours as needed for severe pain. 03/26/17   Aerial Dilley S, PA-C  predniSONE (STERAPRED UNI-PAK 21 TAB) 10 MG (21) TBPK tablet Take by mouth daily. Take 6 tabs by mouth daily  for 2 days, then 5 tabs for 2 days, then 4 tabs for 2 days, then 3 tabs for 2 days, 2 tabs for 2 days, then 1 tab by mouth daily for 2 days 07/11/16   Coralyn MarkMitchell, Melanie L, NP  PRESCRIPTION MEDICATION antibiotic    [provider]  senna-docusate (SENOKOT-S)  8.6-50 MG tablet Take 1 tablet by mouth daily. 03/26/17   Rontavious Manolis S, PA-C  traMADol (ULTRAM) 50 MG tablet Take 1 tablet (50 mg total) by mouth every 6 (six) hours as needed. 04/26/15   Eyvonne Mechanic, PA-C    Family History No family history on file.  Social History Social History   Tobacco Use  . Smoking status: Current Every Day Smoker    Packs/day: 1.00    Types: Cigarettes  . Smokeless tobacco: Never Used  Substance Use Topics  . Alcohol use: No  . Drug use: No     Allergies   Sulfa antibiotics and Bee venom   Review of Systems Review of Systems  Musculoskeletal:       Right foot pain and swelling  Neurological:       Paresthesias to right foot      Physical Exam Updated Vital Signs BP 132/70 (BP Location: Right Arm)   Pulse 86   Temp 98.2 F (36.8 C) (Oral)   Resp 16   Ht 5' 6.5" (1.689 m)   Wt 61.2 kg (135 lb)   SpO2 95%   BMI 21.46 kg/m   Physical Exam  Constitutional: He appears well-developed and well-nourished. He appears distressed.  Eyes: Conjunctivae are normal.  Cardiovascular: Normal rate and regular rhythm.  Pulmonary/Chest: Effort normal.  Musculoskeletal:  Diffuse pain and swelling to the right foot, worse to left 1st metatarsal. Ecchymosis noted to the toes. DP pulses 2+ bilat.  Neurological: He is alert.  Skin: Skin is warm and dry.     ED Treatments / Results  Labs (all labs ordered are listed, but only abnormal results are displayed) Labs Reviewed - No data to display  EKG  EKG Interpretation None       Radiology Dg Foot Complete Right  Result Date: 03/26/2017 CLINICAL DATA:  Heavy object fell on foot. EXAM: RIGHT FOOT COMPLETE - 3+ VIEW COMPARISON:  None. FINDINGS: There is a comminuted fracture through the right 1st metatarsal. This may extend proximally into the 1st tarsal metatarsal joint. Minimal displacement. IMPRESSION: Comminuted fracture through the mid to proximal aspect of the right 1st metatarsal which may extend into the tarsometatarsal joint. Electronically Signed   By: Charlett Nose M.D.   On: 03/26/2017 19:26    Procedures Procedures (including critical care time)  Medications Ordered in ED Medications  oxyCODONE-acetaminophen (PERCOCET/ROXICET) 5-325 MG per tablet 1 tablet (1 tablet Oral Given 03/26/17 1906)  oxyCODONE-acetaminophen (PERCOCET/ROXICET) 5-325 MG per tablet 1 tablet (1 tablet Oral Given 03/26/17 2253)     Initial Impression / Assessment and Plan / ED Course  I have reviewed the triage vital signs and the nursing notes.  Pertinent labs & imaging results that were available during my care of the patient were reviewed by me and considered in my medical  decision making (see chart for details).    Discussed pt presentation and exam findings with Dr. Erma Heritage, who evaluated pt and agrees that there is no evidence of compartment syndrome.  Dr. Lequita Halt was consulted from ortho and advised f/u within 1-3 days. Advised posterior ankle splint and nonweightbearing until seen.  Final Clinical Impressions(s) / ED Diagnoses   Final diagnoses:  Closed displaced fracture of first metatarsal bone of right foot, initial encounter   62 year old male presents the ED today complaining of right foot pain began after crush injury. Pt has swelling and tenderness on exam, however exam not suggestive of compartment syndrome. DP pulses strong bilat. Sensation normal throughout.  Able to wiggle toes, however painful.  No pallor noted. Patient with comminuted fracture through the mid to proximal aspect of the right first metatarsal, with possible extension into the joint.  Ortho consulted as mentioned above. Posterior ankle splint placed and pt d/c with pain medications. Given strict return precautions for RICE protocol and nonweightbearing until seen by ortho. Patient and wife understand instructions and agree to f/u as directed. All questions answered   ED Discharge Orders        Ordered    oxyCODONE-acetaminophen (PERCOCET/ROXICET) 5-325 MG tablet  Every 6 hours PRN,   Status:  Discontinued     03/26/17 2205    senna-docusate (SENOKOT-S) 8.6-50 MG tablet  Daily,   Status:  Discontinued     03/26/17 2205    oxyCODONE-acetaminophen (PERCOCET/ROXICET) 5-325 MG tablet  Every 6 hours PRN     03/26/17 2206    senna-docusate (SENOKOT-S) 8.6-50 MG tablet  Daily     03/26/17 2206       Karrie Meres, PA-C 03/27/17 0047    Shaune Pollack, MD 03/27/17 1127

## 2017-03-26 NOTE — ED Notes (Signed)
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 Pt is alert and oriented x 4 and is verbally responsive. Pt has swelling noted to the rt foot weak rt pedal pulse, and pt has decreased range of motion and is unable move toes.

## 2017-03-26 NOTE — ED Triage Notes (Signed)
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 Patient has swelling to right foot.

## 2017-08-21 ENCOUNTER — Ambulatory Visit: Payer: Worker's Compensation | Attending: Orthopedic Surgery | Admitting: Physical Therapy

## 2017-08-21 ENCOUNTER — Other Ambulatory Visit: Payer: Self-pay

## 2017-08-21 DIAGNOSIS — R2689 Other abnormalities of gait and mobility: Secondary | ICD-10-CM

## 2017-08-21 DIAGNOSIS — M25674 Stiffness of right foot, not elsewhere classified: Secondary | ICD-10-CM

## 2017-08-21 DIAGNOSIS — M25571 Pain in right ankle and joints of right foot: Secondary | ICD-10-CM | POA: Diagnosis not present

## 2017-08-21 DIAGNOSIS — M6281 Muscle weakness (generalized): Secondary | ICD-10-CM | POA: Diagnosis present

## 2017-08-21 NOTE — Patient Instructions (Addendum)
ROM: Inversion / Eversion   With left leg relaxed, gently turn ankle and foot in and out. Move through full range of motion. Avoid pain. Repeat _15__ times per set. Do __2__ sets per session. Do _3-5___ sessions per day.  http://orth.exer.us/36   Copyright  VHI. All rights reserved.  ROM: Plantar / Dorsiflexion   With left leg relaxed, gently flex and extend ankle. Move through full range of motion. Avoid pain. Repeat __15__ times per set. Do __2__ sets per session. Do _3-5___ sessions per day.  http://orth.exer.us/34   Copyright  VHI. All rights reserved.  Ankle Alphabet   Using left ankle and foot only, trace the letters of the alphabet. Perform A to Z. Repeat __5__ times per set. Do __5__ sets per session. Do __3__ sessions per day.  http://orth.exer.us/16   Copyright  VHI. All rights reserved.  Ankle Circles   Slowly rotate right foot and ankle clockwise then counterclockwise. Gradually increase range of motion. Avoid pain. Circle __20__ times each direction per set. Do _2___ sets per session. Do _3-5___ sessions per day.  http://orth.exer.us/30   Copyright  VHI. All rights reserved.   Gastroc / Heel Cord Stretch - Seated With Towel   Sit on floor, towel around ball of foot. Gently pull foot in toward body, stretching heel cord and calf. Hold for _20-30__ seconds. Repeat on involved leg. Repeat _3__ times. Do _3__ times per day.   Cryotherapy Cryotherapy means treatment with cold. Ice or gel packs can be used to reduce both pain and swelling. Ice is the most helpful within the first 24 to 48 hours after an injury or flare-up from overusing a muscle or joint. Sprains, strains, spasms, burning pain, shooting pain, and aches can all be eased with ice. Ice can also be used when recovering from surgery. Ice is effective, has very few side effects, and is safe for most people to use. PRECAUTIONS  Ice is not a safe treatment option for people with:  Raynaud  phenomenon. This is a condition affecting small blood vessels in the extremities. Exposure to cold may cause your problems to return.  Cold hypersensitivity. There are many forms of cold hypersensitivity, including:  Cold urticaria. Red, itchy hives appear on the skin when the tissues begin to warm after being iced.  Cold erythema. This is a red, itchy rash caused by exposure to cold.  Cold hemoglobinuria. Red blood cells break down when the tissues begin to warm after being iced. The hemoglobin that carry oxygen are passed into the urine because they cannot combine with blood proteins fast enough.  Numbness or altered sensitivity in the area being iced. If you have any of the following conditions, do not use ice until you have discussed cryotherapy with your caregiver:  Heart conditions, such as arrhythmia, angina, or chronic heart disease.  High blood pressure.  Healing wounds or open skin in the area being iced.  Current infections.  Rheumatoid arthritis.  Poor circulation.  Diabetes. Ice slows the blood flow in the region it is applied. This is beneficial when trying to stop inflamed tissues from spreading irritating chemicals to surrounding tissues. However, if you expose your skin to cold temperatures for too long or without the proper protection, you can damage your skin or nerves. Watch for signs of skin damage due to cold. HOME CARE INSTRUCTIONS Follow these tips to use ice and cold packs safely.  Place a dry or damp towel between the ice and skin. A damp towel will cool the  skin more quickly, so you may need to shorten the time that the ice is used.  For a more rapid response, add gentle compression to the ice.  Ice for no more than 10 to 20 minutes at a time. The bonier the area you are icing, the less time it will take to get the benefits of ice.  Check your skin after 5 minutes to make sure there are no signs of a poor response to cold or skin damage.  Rest 20  minutes or more between uses.  Once your skin is numb, you can end your treatment. You can test numbness by very lightly touching your skin. The touch should be so light that you do not see the skin dimple from the pressure of your fingertip. When using ice, most people will feel these normal sensations in this order: cold, burning, aching, and numbness.  Do not use ice on someone who cannot communicate their responses to pain, such as small children or people with dementia. HOW TO MAKE AN ICE PACK Ice packs are the most common way to use ice therapy. Other methods include ice massage, ice baths, and cryosprays. Muscle creams that cause a cold, tingly feeling do not offer the same benefits that ice offers and should not be used as a substitute unless recommended by your caregiver. To make an ice pack, do one of the following:  Place crushed ice or a bag of frozen vegetables in a sealable plastic bag. Squeeze out the excess air. Place this bag inside another plastic bag. Slide the bag into a pillowcase or place a damp towel between your skin and the bag.  Mix 3 parts water with 1 part rubbing alcohol. Freeze the mixture in a sealable plastic bag. When you remove the mixture from the freezer, it will be slushy. Squeeze out the excess air. Place this bag inside another plastic bag. Slide the bag into a pillowcase or place a damp towel between your skin and the bag. SEEK MEDICAL CARE IF:  You develop white spots on your skin. This may give the skin a blotchy (mottled) appearance.  Your skin turns blue or pale.  Your skin becomes waxy or hard.  Your swelling gets worse. MAKE SURE YOU:   Understand these instructions.  Will watch your condition.  Will get help right away if you are not doing well or get worse. Document Released: 09/12/2010 Document Revised: 06/02/2013 Document Reviewed: 09/12/2010 [facility] Patient Information 2015 [place], [place]. This information is not intended to replace  advice given to you by your health care provider. Make sure you discuss any questions you have with your health care provider.   Garen Lah, PT Certified Exercise Expert for the Aging Adult  08/21/17 9:18 AM Phone: [phone] Fax: [phone]

## 2017-08-21 NOTE — Therapy (Signed)
[facility] [address] [place], [place], [address] Phone: [phone]   Fax:  [phone]  Physical Therapy Evaluation  Patient Details  Name: Aaron Stevens MRN: [id number] Date of Birth: 1956/01/24 Referring Provider: Ollen Gross MD   Encounter Date: 08/21/2017  PT End of Session - 08/21/17 0840    Visit Number  1    Number of Visits  8    Date for PT Re-Evaluation  10/02/17    Authorization Type  self pay    PT Start Time  0845    PT Stop Time  0930    PT Time Calculation (min)  45 min    Activity Tolerance  Patient limited by pain    Behavior During Therapy  [facility] for tasks assessed/performed       Past Medical History:  Diagnosis Date  . Atherosclerosis of artery   . Emphysema of lung (HCC)   . Pneumonia     Past Surgical History:  Procedure Laterality Date  . APPENDECTOMY    . KNEE SURGERY      There were no vitals filed for this visit.   Subjective Assessment - 08/21/17 0850    Subjective  I cant walk on my foot well.  When I am walking without my sock on my right foot , it feels mushy like I am walking on water.  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 I was not given medical care immediately after injury.  The emergency room     Patient is accompained by:  Family member wife    Pertinent History  Emphysema, left knee surgery > 20 years ago    Limitations  Sitting;Standing;House hold activities    How long can you sit comfortably?  I can sit for an hour    How long can you stand comfortably?  <    How long can you walk comfortably?  <    Diagnostic tests  x ray    Patient Stated Goals  I would like my foot better so I can walk normally and stand up longer than 30 miinutes to do stuff around house and to be able to get back to fishing and walk on uneven grounds, mow the yard    Currently in Pain?  Yes    Pain  Score  10-Worst pain ever average 5-6/10 at worst to 10/10    Pain Location  Foot    Pain Orientation  Right    Pain Descriptors / Indicators  Numbness;Sharp;Shooting    Pain Type  Neuropathic pain;Chronic pain    Pain Radiating Towards  radiates into big toe    Pain Onset  More than a month ago    Pain Frequency  Constant    Aggravating Factors   standing, moving my foot in certain ways,          [facility] PT Assessment - 08/21/17 0841      Assessment   Medical Diagnosis  R Metatarsal closed fx     Referring Provider  Ollen Gross MD    Onset Date/Surgical Date  03/26/17 injury at lumbar yard    Hand Dominance  Left    Next MD Visit  Dr Lequita Halt in 4 weeks    Prior Therapy  none      Precautions   Precaution Comments  WBAT on right     Required Braces  or Orthoses  Other Brace/Splint    Other Brace/Splint  wearing Procare walking boot      Restrictions   Weight Bearing Restrictions  Yes    RLE Weight Bearing  Weight bearing as tolerated      Balance Screen   Has the patient fallen in the past 6 months  No    Has the patient had a decrease in activity level because of a fear of falling?   No    Is the patient reluctant to leave their home because of a fear of falling?   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 #      Cognition   Overall Cognitive Status  Within Functional Limits for tasks assessed      Observation/Other Assessments   Observations  right LE very discolored and mottled in appearance     Skin Integrity  waxy feel, very stiff    Focus on Therapeutic Outcomes (FOTO)    FOTO not taken because FOTO pad did not work for pt      Observation/Other Assessments-Edema    Edema  Circumferential      Circumferential Edema   Circumferential - Right  26.5 cm around Great toe and metatarsals    Circumferential - Left   24 cm taken in morning,sweeling worse at night      Sensation   Hot/Cold  Impaired by gross assessment cant feel hot or cold when taking a bath on foot    Additional Comments  numbness in right dorsum of foot. lright foot markedly feel colds      Functional Tests   Functional tests  Squat;Single leg stance;Sit to Stand      Squat   Comments  squats with wt shift to right      Single Leg Stance   Comments  unable to stand on right LE at all due to pain      Sit to Stand   Comments  Pt must use UE to come to standing and wt bears to left      ROM / Strength   AROM / PROM / Strength  AROM;Strength      AROM   Overall AROM   Deficits    Overall AROM Comments  right great toe 15 ext, 5 degrees flex on right  60 ext on left and 20 on flexion    Right Knee Extension  -4    Right Knee Flexion  140    Left Knee Extension  -5    Left Knee Flexion  145    Right Ankle Dorsiflexion  12    Right Ankle Plantar Flexion  50    Right Ankle Inversion  33    Right Ankle Eversion  12    Left Ankle Dorsiflexion  -4    Left Ankle Plantar Flexion  19    Left Ankle Inversion  14    Left Ankle Eversion  0  Strength   Overall Strength  Deficits    Overall Strength Comments  Right great toe in pain with every movementflex and ext    Right Hip Flexion  4+/5    Right Hip Extension  4+/5    Right Hip ABduction  4/5    Left Hip Flexion  4+/5    Left Hip Extension  4/5    Left Hip ABduction  4/5    Right Knee Flexion  4+/5 limited by pain in ankle    Right Knee Extension  4+/5    Left Knee Flexion  5/5    Left Knee Extension  5/5    Right Ankle Dorsiflexion  3-/5    Right Ankle Plantar Flexion  2+/5    Right Ankle Inversion  3-/5    Right Ankle  Eversion  3-/5    Left Ankle Dorsiflexion  5/5    Left Ankle Plantar Flexion  5/5    Left Ankle Inversion  5/5    Left Ankle Eversion  5/5      Palpation   Palpation comment  tenderness to any palpation over any part of ankle with even light touch.  Marked fear avoidance by pt. Noted brittle nail growth on right foot  and waxy skin mottled in appearance                Objective measurements completed on examination: See above findings.      [facility] Adult PT Treatment/Exercise - 08/21/17 0001      Self-Care   Self-Care  Other Self-Care Comments    Other Self-Care Comments   cryotherapy for swellling       Ankle Exercises: Stretches   Gastroc Stretch  30 seconds;3 reps with towel      Ankle Exercises: Supine   Other Supine Ankle Exercises  AROM DF adn PF and  IN EV on right , circles and writing ABCs with foot as able              PT Education - 08/21/17 1453    Education Details  POC  Explanation of findings. HEP for AROM of ankle and DF stretch with towel with information for cryotherapy    Person(s) Educated  Patient;Spouse    Methods  Explanation;Demonstration;Tactile cues;Verbal cues;Handout    Comprehension  Verbalized understanding;Returned demonstration       PT Short Term Goals - 08/21/17 1754      PT SHORT TERM GOAL #1   Title  Pt will be independent with intial HEP    Time  2    Period  Weeks    Status  New    Target Date  09/04/17      PT SHORT TERM GOAL #2   Title  Pt will be educated on proper gait using assitive device    Time  2    Period  Weeks    Status  New        PT Long Term Goals - 08/21/17 1754      PT LONG TERM GOAL #1   Title  "Pt will be independent with advanced HEP    Time  6    Period  Weeks    Status  New    Target Date  10/02/17      PT LONG TERM GOAL #2   Title  "Pt will be able to negotiate steps without exacerbation of pain greater than 4/10     Time  6    Period  Weeks  Status  New    Target Date   10/02/17      PT LONG TERM GOAL #3   Title  "R ankle AROM flexion will improve to to DF of 6 degrees for improved mobility and to walk in a regular shoe rather than a walking boot    Time  6    Status  New    Target Date  10/02/17      PT LONG TERM GOAL #4   Title  Pt will be educated on pain management strategies in order to stand for 30 minutes at the house with LRAD toaccomplish home tasks    Time  6    Period  Weeks    Status  New    Target Date  10/02/17      PT LONG TERM GOAL #5   Title  Pt will be able to walk on unlevel grround with pain levele 4/10 or less in order to pursue fishing tasks on incline    Time  6    Period  Weeks    Status  New    Target Date  10/02/17             Plan - 08/21/17 1610    Clinical Impression Statement  Pt presents with wife with right ankle/foot in walking shoe and has not had PT since injury on March 26, 2017 from lumber falling on right foot and Pt sustaining Comminuted fracture through the mid to proximal aspect of the right.  Pt presents with difficulty walking and WBAT on R . Pt cannot move foot in any directions without marked pain.  Pt symptoms compatible with Complex regional pain syndrome with intense and marked pain to 10/10. with brittle nails and waxy mottled looking skin with swelling in right ankle. Pt  has reported not moving his foot much in months since injury in February.  Pt will benefit from skilled PT to address limitations in AROM, muscle weakness , marked pain , changes in skin and nail brittlness.  Pt would benefit from 2x a week for 2 weeks and then 1 x a week for 4 weeks to address limitations to improve current lack of mobility in patient.  If pt pain not improved will return to MD for further work up for pain management    History and Personal Factors relevant to plan of care:  WBAT Right foot    Clinical Presentation  Stable    Clinical Decision Making  Low    Rehab Potential  Good    PT Frequency  1x / week  first 2 weeks 2 x a week    PT Duration  6 weeks    PT Treatment/Interventions  ADLs/Self Care Home Management;Cryotherapy;Electrical Stimulation;Iontophoresis 4mg /ml Dexamethasone;Moist Heat;Ultrasound;Contrast Bath;Balance training;Therapeutic exercise;Therapeutic activities;Functional mobility training;Stair training;Gait training;Neuromuscular re-education;Patient/family education;Manual techniques;Passive range of motion;Dry needling;Taping    PT Next Visit Plan  increase AROM,  work with Radiographer, therapeutic for [employer] possiblity, gait training and contrast bath     PT Home Exercise Plan  AROM of right foot and great toe    Consulted and Agree with Plan of Care  Patient;Family member/caregiver wife       Patient will benefit from skilled therapeutic intervention in order to improve the following deficits and impairments:  Abnormal gait, Pain, Impaired sensation, Increased muscle spasms, Increased edema, Decreased skin integrity, Decreased strength, Difficulty walking, Decreased range of motion, Decreased mobility  Visit Diagnosis: Pain in right ankle and joints of right foot  Stiffness of right foot, not elsewhere classified  Other abnormalities of gait and mobility  Muscle weakness (generalized)     Problem List There are no active problems to display for this patient.  Garen Lah, PT Certified Exercise Expert for the Aging Adult  08/21/17 6:08 PM Phone: [phone] Fax: [phone]  [facility] [address] [place], [place], [address] Phone: [phone]   Fax:  [phone]  Name: Aaron Stevens MRN: [id number] Date of Birth: 07/09/1955

## 2017-08-23 ENCOUNTER — Ambulatory Visit: Payer: Worker's Compensation | Attending: Orthopedic Surgery | Admitting: Physical Therapy

## 2017-08-23 ENCOUNTER — Encounter: Payer: Self-pay | Admitting: Physical Therapy

## 2017-08-23 DIAGNOSIS — M25674 Stiffness of right foot, not elsewhere classified: Secondary | ICD-10-CM | POA: Diagnosis present

## 2017-08-23 DIAGNOSIS — M6281 Muscle weakness (generalized): Secondary | ICD-10-CM | POA: Diagnosis present

## 2017-08-23 DIAGNOSIS — R2689 Other abnormalities of gait and mobility: Secondary | ICD-10-CM | POA: Diagnosis present

## 2017-08-23 DIAGNOSIS — M25571 Pain in right ankle and joints of right foot: Secondary | ICD-10-CM

## 2017-08-23 NOTE — Therapy (Signed)
[facility] [address] [place], [place], [address] Phone: [phone]   Fax:  [phone]  Physical Therapy Treatment  Patient Details  Name: Aaron Stevens MRN: [id number] Date of Birth: 1955/11/11 Referring Provider: Ollen GrossAluisio, Frank MD   Encounter Date: 08/23/2017  PT End of Session - 08/23/17 1057    Visit Number  2    Number of Visits  8    Date for PT Re-Evaluation  10/02/17    Authorization Type  self pay    PT Start Time  1100    PT Stop Time  1150    PT Time Calculation (min)  50 min    Activity Tolerance  Patient limited by pain    Behavior During Therapy  [facility] for tasks assessed/performed       Past Medical History:  Diagnosis Date  . Atherosclerosis of artery   . Emphysema of lung (HCC)   . Pneumonia     Past Surgical History:  Procedure Laterality Date  . APPENDECTOMY    . KNEE SURGERY      There were no vitals filed for this visit.  Subjective Assessment - 08/23/17 1105    Subjective  I feel like if i did not have my shoe my foot would feel like I was walking on water.  It feels numb on my right foot. I forgot my cane at home.     Pertinent History  Emphysema, left knee surgery > 20 years ago    Patient Stated Goals  I would like my foot better so I can walk normally and stand up longer than 30 miinutes to do stuff around house and to be able to get back to fishing and walk on uneven grounds, mow the yard    Currently in Pain?  Yes    Pain Score  4     Pain Location  Foot    Pain Orientation  Right    Pain Descriptors / Indicators  Numbness    Pain Type  Neuropathic pain;Chronic pain    Pain Onset  More than a month ago    Pain Frequency  Constant                       [facility] Adult PT Treatment/Exercise - 08/23/17 1122      Ambulation/Gait   Stairs  Yes    Stairs Assistance  6: Modified independent (Device/Increase time) straight cane    Stair Management Technique  Step to  pattern    Number of Stairs  8    Height of Stairs  6    Pre-Gait Activities  Pt with antalgic gait working on heel toe and wt shifting in llbar side to side and with staggered steps.      Gait Comments  Pt with right foot everting on level surfaces practicing over 1000 feet with mindfulness of foot position.        Self-Care   Self-Care  Heat/Ice Application    Other Self-Care Comments   contrast bath home instructions for pt and wife , they verbalized understanding.        Modalities   Modalities  Moist Heat      Moist Heat Therapy   Number Minutes Moist Heat  15 Minutes    Moist Heat Location  Ankle rigth foot. concurrent with soft tissue work      Manual Therapy   Manual Therapy  Soft tissue mobilization;Joint mobilization    Joint Mobilization  gentle Grade 1/2 mobs of right great toe tolerance    Soft tissue mobilization  retrograde massage of foot with concurrent moist heat       Ankle Exercises: Stretches   Gastroc Stretch  3 reps;20 seconds right side      Ankle Exercises: Seated   Towel Crunch  3 reps Pt bil using feet.  right side painful at toe flexion    Other Seated Ankle Exercises  seated ankle DF stretch by flexiing right knee back to end range x 5 and hold 10 sec.      Ankle Exercises: Supine   Other Supine Ankle Exercises  REsisted with yellow t band DF, PF, IN and EV with PT x 10 each and then with wife assisting x 10 each             PT Education - 08/23/17 1125    Education Details  Gastroc stretch and wt shifting exercises with contrast bath instructions for home use    Person(s) Educated  Patient    Methods  Explanation;Demonstration;Tactile cues;Handout;Verbal cues    Comprehension  Verbalized understanding;Returned demonstration       PT Short Term Goals - 08/23/17 1227      PT SHORT TERM GOAL #1   Title  Pt will be independent with intial HEP    Time  2    Period  Weeks    Status  On-going      PT SHORT TERM GOAL #2   Title  Pt will  be educated on proper gait using assitive device    Baseline  cane use on level and steps    Time  2    Period  Weeks    Status  Achieved        PT Long Term Goals - 08/21/17 1754      PT LONG TERM GOAL #1   Title  "Pt will be independent with advanced HEP    Time  6    Period  Weeks    Status  New    Target Date  10/02/17      PT LONG TERM GOAL #2   Title  "Pt will be able to negotiate steps without exacerbation of pain greater than 4/10     Time  6    Period  Weeks    Status  New    Target Date  10/02/17      PT LONG TERM GOAL #3   Title  "R ankle AROM flexion will improve to to DF of 6 degrees for improved mobility and to walk in a regular shoe rather than a walking boot    Time  6    Status  New    Target Date  10/02/17      PT LONG TERM GOAL #4   Title  Pt will be educated on pain management strategies in order to stand for 30 minutes at the house with LRAD toaccomplish home tasks    Time  6    Period  Weeks    Status  New    Target Date  10/02/17      PT LONG TERM GOAL #5   Title  Pt will be able to walk on unlevel grround with pain levele 4/10 or less in order to pursue fishing tasks on incline    Time  6    Period  Weeks    Status  New    Target Date  10/02/17  Plan - 08/23/17 1211    Clinical Impression Statement  PT presents without cane but utilizes clinic for gait training and wt shifting and stairs. Pt able to wt shift onto right foot and appropriately unweighting left side today in llbars.  Pt was able to slightly flex and extend right great toe today.  Pt / wife given information and education on contrast bath for pain control. Pt still most bothered by sensory disturbances. Pt added to HEP with resisted exercisese and yellow t band and towel crunch with bilatearl feet for overflow into weakened right foot. STG #2 acheived for gait. will continue to progress toward goals    Rehab Potential  Good    PT Frequency  1x / week 2 x a week for  1st 2 weeks then    PT Duration  6 weeks    PT Treatment/Interventions  ADLs/Self Care Home Management;Cryotherapy;Electrical Stimulation;Iontophoresis 4mg /ml Dexamethasone;Moist Heat;Ultrasound;Contrast Bath;Balance training;Therapeutic exercise;Therapeutic activities;Functional mobility training;Stair training;Gait training;Neuromuscular re-education;Patient/family education;Manual techniques;Passive range of motion;Dry needling;Taping    PT Next Visit Plan  increase AROM of right Great toe, progress to sitting Plantarflexion to progress to heel raise and toe raise with chair as able.  Continue    PT Home Exercise Plan  AROM of right foot and great toe contrast bath, resisted Ankle with yellow tband and towel crunch    Consulted and Agree with Plan of Care  Family member/caregiver;Patient       Patient will benefit from skilled therapeutic intervention in order to improve the following deficits and impairments:  Abnormal gait, Pain, Impaired sensation, Increased muscle spasms, Increased edema, Decreased skin integrity, Decreased strength, Difficulty walking, Decreased range of motion, Decreased mobility  Visit Diagnosis: Pain in right ankle and joints of right foot  Stiffness of right foot, not elsewhere classified  Other abnormalities of gait and mobility  Muscle weakness (generalized)     Problem List There are no active problems to display for this patient.  Garen Lah, PT Certified Exercise Expert for the Aging Adult  08/23/17 12:30 PM Phone: [phone] Fax: [phone]  [facility] [address] [place], [place], [address] Phone: [phone]   Fax:  [phone]  Name: Aaron Stevens MRN: [id number] Date of Birth: Jun 26, 1955

## 2017-08-23 NOTE — Patient Instructions (Addendum)
      Achilles / Gastroc, Standing   Stand, right foot behind, heel on floor and turned slightly out, leg straight, forward leg bent. Move hips forward. Hold _15__ seconds. Try to work towards 30 sec at counter.  Keep your foot pointeRyder Systemd forward and heel down as shown in clinic Repeat _3__ times per session. Do _3__ sessions per day.   Toe Curl: Bilateral   With both feet resting on towel, slowly bunch up towel by curling toes. Hold _3___ seconds. Repeat _15___ times per set. Do _1__ sets per session. Do _2___ sessions per day.  http://orth.exer.us/20   Copyright  VHI. All rights reserved.  Ankle Plantar Flexion / Dorsiflexion, Standing    Inversion: Resisted   Cross legs with right leg underneath, foot in tubing loop. Hold tubing around other foot to resist and turn foot in. Repeat __15__ times per set. Do _1__ sets per session. Do ___2_ sessions per day.  http://orth.exer.us/12   Copyright  VHI. All rights reserved.  Eversion: Resisted   With right foot in tubing loop, hold tubing around other foot to resist and turn foot out. Dont move legs . Just move feet out Repeat _15___ times per set. Do _1___ sets per session. Do ___2_ sessions per day.  http://orth.exer.us/14   Copyright  VHI. All rights reserved.  Plantar Flexion: Resisted   Anchor behind, tubing around left foot, press down. Repeat ___15_ times per set. Do __1__ sets per session. Do _2___ sessions per day.  http://orth.exer.us/10   Copyright  VHI. All rights reserved.  Dorsiflexion: Resisted   Facing anchor, tubing around left foot, pull toward face.  Repeat _15___ times per set. Do __1__ sets per session. Do __2__ sessions per day.  http://orth.exer.us/8   Copyright  VHI. All rights reserved.           Dorsiflexion: Self-Mobilization (Sitting)   Feet flat, other foot forward, slide left foot back until gentle stretch is felt. Keep entire foot on floor. Hold _30___ seconds.  Relax. Repeat __3__ times per set. Do __3__ sessions per day.         Garen LahLawrie Michaiah Maiden, PT Certified Exercise Expert for the Aging Adult  08/23/17 11:25 AM Phone: [phone] Fax: [phone]

## 2017-08-28 ENCOUNTER — Encounter: Payer: Self-pay | Admitting: Physical Therapy

## 2017-08-28 ENCOUNTER — Ambulatory Visit: Payer: Worker's Compensation | Attending: Orthopedic Surgery | Admitting: Physical Therapy

## 2017-08-28 DIAGNOSIS — M6281 Muscle weakness (generalized): Secondary | ICD-10-CM | POA: Diagnosis present

## 2017-08-28 DIAGNOSIS — M25674 Stiffness of right foot, not elsewhere classified: Secondary | ICD-10-CM | POA: Insufficient documentation

## 2017-08-28 DIAGNOSIS — M25571 Pain in right ankle and joints of right foot: Secondary | ICD-10-CM | POA: Diagnosis present

## 2017-08-28 DIAGNOSIS — R2689 Other abnormalities of gait and mobility: Secondary | ICD-10-CM | POA: Insufficient documentation

## 2017-08-28 NOTE — Patient Instructions (Signed)
Heel Raise, Seated    Sit on ball with knees bent at 90.  Without a dumbbell. Lift heels off floor. Do 3___ sets of _10__ repetitions. Do not sit on a ball. Sit on a sturdy chair   Heel Raise: Bilateral (Standing)    Rise on balls of feet. At counter hold on Repeat __10__ times per set. Do _3___ sets per session. Do _1-2___ sessions per day.  http://orth.exer.us/38   Copyright  VHI. All rights reserved.   Garen LahLawrie Beardsley, PT Certified Exercise Expert for the Aging Adult  08/28/17 8:49 AM Phone: [phone] Fax: [phone]

## 2017-08-28 NOTE — Therapy (Signed)
[facility] Outpatient Rehabilitation [facility] [address] [place], [place], [address] Phone: [phone]   Fax:  [phone]  Physical Therapy Treatment  Patient Details  Name: Aaron Stevens MRN: [id number] Date of Birth: [birth year] Referring Provider: Ollen Gross MD   Encounter Date: 08/28/2017  PT End of Session - 08/28/17 0859    Visit Number  3    Number of Visits  8    Date for PT Re-Evaluation  10/02/17    Authorization Type  self pay    PT Start Time  0802    PT Stop Time  0850    PT Time Calculation (min)  48 min    Activity Tolerance  Patient tolerated treatment well    Behavior During Therapy  [facility] for tasks assessed/performed       Past Medical History:  Diagnosis Date  . Atherosclerosis of artery   . Emphysema of lung (HCC)   . Pneumonia     Past Surgical History:  Procedure Laterality Date  . APPENDECTOMY    . KNEE SURGERY      There were no vitals filed for this visit.  Subjective Assessment - 08/28/17 0806    Subjective  I feeling like my foot swells up at night.  I got new shoes and a cane.  I am about a 4/10 at beginning.  7/10 at end with mobilization    Pertinent History  Emphysema, left knee surgery > 20 years ago    Limitations  Sitting;Standing;House hold activities    How long can you sit comfortably?  I can sit for an hour    Patient Stated Goals  I would like my foot better so I can walk normally and stand up longer than 30 miinutes to do stuff around house and to be able to get back to fishing and walk on uneven grounds, mow the yard    Currently in Pain?  Yes    Pain Score  4     Pain Location  Foot    Pain Orientation  Right    Pain Descriptors / Indicators  Numbness    Pain Type  Neuropathic pain    Pain Onset  More than a month ago    Pain Frequency  Constant         [facility] PT Assessment - 08/28/17 0812      AROM   Overall AROM Comments  right great toe 20 ext, 25 degrees flex on right  60 ext on left      Left Ankle Dorsiflexion  3    Left Ankle Plantar Flexion  39    Left Ankle Inversion  28    Left Ankle Eversion  10                   [facility] Adult PT Treatment/Exercise - 08/28/17 0812      Ambulation/Gait   Stairs  Yes    Stairs Assistance  6: Modified independent (Device/Increase time) straight cane    Stair Management Technique  Step to pattern    Number of Stairs  8    Height of Stairs  6    Pre-Gait Activities  Pt with decreased antalgic gait and more wt shift to right with use of cane    Gait Comments  reminding pt of correct sequience moderate cuing      Self-Care   Self-Care  Other Self-Care Comments    Other Self-Care Comments   use of orthotics for arch support Medi  arch support given.       Manual Therapy   Manual Therapy  Soft tissue mobilization;Joint mobilization    Joint Mobilization  grade 2/3 all 5 toes distratction and ext/flexion with ocillations and end to pt tolerance.  contract/relax great toe , grade 2/3 metatarsal joint distraction    Soft tissue mobilization  soft tissue work to foot      Ankle Exercises: Seated   Heel Raises  15 reps;Right    Toe Raise  15 reps right    Other Seated Ankle Exercises  seated ankle towel scrunch 1 towel length      Ankle Exercises: Standing   Heel Raises  Both;10 reps x2 with poor motor control on right, must do bilaterally      Ankle Exercises: Supine   Other Supine Ankle Exercises  IR, ER, DF, PF x 10 with red t band      Ankle Exercises: Stretches   Gastroc Stretch  3 reps;30 seconds right side             PT Education - 08/28/17 0849    Education Details  Pt educated on gastroc strength ex and added to HEP. gait on stairs and level surfaces and shoe orthotics for arch support in new shoes    Person(s) Educated  Patient    Methods  Explanation;Demonstration;Tactile cues;Verbal cues    Comprehension  Verbalized understanding;Returned demonstration       PT Short Term Goals - 08/28/17 0904       PT SHORT TERM GOAL #1   Title  Pt will be independent with intial HEP    Time  2    Period  Weeks    Status  Achieved      PT SHORT TERM GOAL #2   Title  Pt will be educated on proper gait using assitive device    Baseline  cane use on level and steps    Time  2    Period  Weeks    Status  Achieved        PT Long Term Goals - 08/21/17 1754      PT LONG TERM GOAL #1   Title  "Pt will be independent with advanced HEP    Time  6    Period  Weeks    Status  New    Target Date  10/02/17      PT LONG TERM GOAL #2   Title  "Pt will be able to negotiate steps without exacerbation of pain greater than 4/10     Time  6    Period  Weeks    Status  New    Target Date  10/02/17      PT LONG TERM GOAL #3   Title  "R ankle AROM flexion will improve to to DF of 6 degrees for improved mobility and to walk in a regular shoe rather than a walking boot    Time  6    Status  New    Target Date  10/02/17      PT LONG TERM GOAL #4   Title  Pt will be educated on pain management strategies in order to stand for 30 minutes at the house with LRAD toaccomplish home tasks    Time  6    Period  Weeks    Status  New    Target Date  10/02/17      PT LONG TERM GOAL #5   Title  Pt will be  able to walk on unlevel grround with pain levele 4/10 or less in order to pursue fishing tasks on incline    Time  6    Period  Weeks    Status  New    Target Date  10/02/17            Plan - 08/28/17 0859    Clinical Impression Statement  Pt presents with pain at 4/10  able to put feet in new shoes one size larger.  Pt complaint of medial arch pain and was given medi arch support which improved arch pain in ambulation and stair climibing. Pt educated on gait on steps with cane. Pt needs reinforcement.  Pt reviewed HEP and was also given new exercises for heel raising in sitting and standing.  All STG achieved. Pt cannot single limb heel raise at this time.  See Flow sheet  Pt has increased in AROM  in all foot motions.  Pt was explained nature of nerve pain and will be referred back to MD to address numbness.  Pt is feeling more pins and needles today.  After joint moibilization, pat was a 7/10 pain but had increased motion of foot/toes.  Will continue to maximize function. and progress toward goals    Rehab Potential  Good    PT Frequency  1x / week    PT Duration  6 weeks    PT Treatment/Interventions  ADLs/Self Care Home Management;Cryotherapy;Electrical Stimulation;Iontophoresis 4mg /ml Dexamethasone;Moist Heat;Ultrasound;Contrast Bath;Balance training;Therapeutic exercise;Therapeutic activities;Functional mobility training;Stair training;Gait training;Neuromuscular re-education;Patient/family education;Manual techniques;Passive range of motion;Dry needling;Taping    PT Next Visit Plan  increase AROM of right Great toe,  mobilize toes as able . check orthotic and signs of redness, strengthen and gait train as able    PT Home Exercise Plan  AROM of right foot and great toe contrast bath, resisted Ankle with yellow tband and towel crunch gastroc strength sitting and standing    Consulted and Agree with Plan of Care  Patient       Patient will benefit from skilled therapeutic intervention in order to improve the following deficits and impairments:  Abnormal gait, Pain, Impaired sensation, Increased muscle spasms, Increased edema, Decreased skin integrity, Decreased strength, Difficulty walking, Decreased range of motion, Decreased mobility  Visit Diagnosis: Pain in right ankle and joints of right foot  Stiffness of right foot, not elsewhere classified  Other abnormalities of gait and mobility  Muscle weakness (generalized)     Problem List There are no active problems to display for this patient.  Garen Lah, PT Certified Exercise Expert for the Aging Adult  08/28/17 9:06 AM Phone: [phone] Fax: [phone]  [facility] Outpatient Rehabilitation [facility] [address] [place], [place], [address] Phone: [phone]   Fax:  [phone]  Name: Eithan Beagle MRN: [id number] Date of Birth: Jan 09, 1956

## 2017-08-31 ENCOUNTER — Encounter

## 2017-09-04 ENCOUNTER — Encounter: Payer: Self-pay | Admitting: Physical Therapy

## 2017-09-04 ENCOUNTER — Ambulatory Visit: Payer: Worker's Compensation | Attending: Orthopedic Surgery | Admitting: Physical Therapy

## 2017-09-04 DIAGNOSIS — M25674 Stiffness of right foot, not elsewhere classified: Secondary | ICD-10-CM

## 2017-09-04 DIAGNOSIS — R2689 Other abnormalities of gait and mobility: Secondary | ICD-10-CM | POA: Diagnosis present

## 2017-09-04 DIAGNOSIS — M25571 Pain in right ankle and joints of right foot: Secondary | ICD-10-CM | POA: Diagnosis present

## 2017-09-04 DIAGNOSIS — M6281 Muscle weakness (generalized): Secondary | ICD-10-CM | POA: Diagnosis present

## 2017-09-04 NOTE — Therapy (Signed)
[place], [place], [address] Phone: [phone]   Fax:  [phone]  Physical Therapy Treatment  Patient Details  Name: Aaron Stevens MRN: [id number] Date of Birth: 1955/06/23 Referring Provider: Gaynelle Arabian MD   Encounter Date: 09/04/2017  PT End of Session - 09/04/17 1217    Visit Number  4    Number of Visits  8    Date for PT Re-Evaluation  10/02/17    Authorization Type  self pay    PT Start Time  1147    PT Stop Time  1230    PT Time Calculation (min)  43 min    Activity Tolerance  Patient tolerated treatment well    Behavior During Therapy  [facility] for tasks assessed/performed       Past Medical History:  Diagnosis Date  . Atherosclerosis of artery   . Emphysema of lung ([place])   . Pneumonia     Past Surgical History:  Procedure Laterality Date  . APPENDECTOMY    . KNEE SURGERY      There were no vitals filed for this visit.  Subjective Assessment - 09/04/17 1150    Subjective  wife accompanies pt to PT today and states Aaron Stevens and states she is working with an Forensic psychologist and they would like to work with a new attorney now.  Wife and Pt states he is unable to work due to nerve damage in foot.   tried to use a weed eater to take care of lawm but was unable to do it for longer than 15-20 minutes    Pertinent History  Emphysema, left knee surgery > 20 years ago    Limitations  Sitting;Standing;House hold activities    How long can you sit comfortably?  I can sit for an hour    How long can you stand comfortably?  < 10 minutes    How long can you walk comfortably?  , 10 minutes    Diagnostic tests  x ray    Patient Stated Goals  I would like my foot better so I can walk normally and stand up longer than 30 miinutes to do stuff around house and to be able to get back to fishing and walk on uneven grounds, mow the yard    Currently in Pain?  Yes    Pain Score  6     Pain Location  Foot     Pain Orientation  Right    Pain Descriptors / Indicators  Numbness    Pain Type  Neuropathic pain;Chronic pain    Pain Onset  More than a month ago    Pain Frequency  Constant         [facility] PT Assessment - 09/04/17 1246      AROM   Left Ankle Dorsiflexion  5    Left Ankle Plantar Flexion  40    Left Ankle Inversion  28    Left Ankle Eversion  10                   [facility] Adult PT Treatment/Exercise - 09/04/17 0001      Ambulation/Gait   Stairs  Yes    Stairs Assistance  6: Modified independent (Device/Increase time)    Stair Management Technique  Alternating pattern    Number of Stairs  16    Height of Stairs  6    Pre-Gait Activities  Pt utilizing cane with more wt shift ot right  foot       Knee/Hip Exercises: Standing   Lateral Step Up  2 sets;10 reps;Right;Hand Hold: 2;Step Height: 6"    Forward Step Up  Right;2 sets;10 reps;Hand Hold: 2    Step Down  Right;10 reps;2 sets;Step Height: 6";Hand Hold: 2    Other Standing Knee Exercises  4 way SLR x 10 with right and left      Manual Therapy   Manual Therapy  Soft tissue mobilization;Joint mobilization    Joint Mobilization  grade 2/3 all 5 toes distratction and ext/flexion with ocillations and end to pt tolerance.  contract/relax great toe , grade 2/3 metatarsal joint distraction also talocrural AO glide with cavitation and increased DF    Soft tissue mobilization  soft tissue work to foot      Ankle Exercises: Stretches   Press photographer Limitations  slant board 5 x 10 sec hold on right      Ankle Exercises: Seated   Heel Raises  15 reps;Both    Other Seated Ankle Exercises  seated ankle towel scrunch 1 towel length      Ankle Exercises: Standing   Other Standing Ankle Exercises  standing heel raise with tennis ball squeeze for tib post activation             PT Education - 09/04/17 1222    Education Details  added knee step ups, lateral step ups and step downs for LE strength    Person(s)  Educated  Patient    Methods  Explanation;Demonstration;Tactile cues;Verbal cues;Handout    Comprehension  Verbalized understanding;Returned demonstration       PT Short Term Goals - 08/28/17 0904      PT SHORT TERM GOAL #1   Title  Pt will be independent with intial HEP    Time  2    Period  Weeks    Status  Achieved      PT SHORT TERM GOAL #2   Title  Pt will be educated on proper gait using assitive device    Baseline  cane use on level and steps    Time  2    Period  Weeks    Status  Achieved        PT Long Term Goals - 09/04/17 1153      PT LONG TERM GOAL #1   Title  "Pt will be independent with advanced HEP    Baseline  working  on standing and endurance exercises and pain management    Time  6    Period  Weeks    Status  On-going      PT LONG TERM GOAL #2   Title  "Pt will be able to negotiate steps without exacerbation of pain greater than 4/10     Baseline  7/10 pain in right of bottom of foot and ankle    Time  6    Period  Weeks    Status  On-going      PT LONG TERM GOAL #3   Title  "R ankle AROM flexion will improve to to DF of 6 degrees for improved mobility and to walk in a regular shoe rather than a walking boot    Baseline  R ankle AROM DF 5 degrees today and walking in tennis shoes now    Time  6    Period  Weeks    Status  Partially Met      PT LONG TERM GOAL #4   Title  Pt  will be educated on pain management strategies in order to stand for 30 minutes at the house with [place] home tasks    Baseline  Pt using cane for LRAD but pain is still an issue, tried to shop for 30 minutes but pain up to 10/10 after 30 minutes of shopping    Time  6    Period  Weeks    Status  On-going      PT LONG TERM GOAL #5   Title  Pt will be able to walk on unlevel grround with pain levele 4/10 or less in order to pursue fishing tasks on incline    Baseline  Unable /have not tried to walk on unlevel surfaces    Time  6    Period  Weeks    Status   On-going            Plan - 09/04/17 1848    Clinical Impression Statement  Pt with 6/10 pain in foot and unable to stand for greater than 10 minutes due to nerve pain in foot.  Pt now able to use a cane for ambulation.  Pt has increased DF of foot to 6 degrees but neuro deficit keeps him from pushing off effectiively with Right toe.  Pt is making progress with ambulaltion and strengtheing but may need further examiniation of neurological function once PT session complete in 4 sessions and returns to MD.      Rehab Potential  Good    PT Frequency  1x / week    PT Duration  6 weeks    PT Treatment/Interventions  ADLs/Self Care Home Management;Cryotherapy;Electrical Stimulation;Iontophoresis '4mg'$ /ml Dexamethasone;Moist Heat;Ultrasound;Contrast Bath;Balance training;Therapeutic exercise;Therapeutic activities;Functional mobility training;Stair training;Gait training;Neuromuscular re-education;Patient/family education;Manual techniques;Passive range of motion;Dry needling;Taping    PT Next Visit Plan  increase AROM of right Great toe,  mobilize toes as able . check orthotic and signs of redness, strengthen and gait train as able  Standing gluteal activation and work on endurance with standing for 20 minutes in clinic wiithout rest   PT Home Exercise Plan  AROM of right foot and great toe contrast bath, resisted Ankle with yellow tband and towel crunch gastroc strength sitting and standing step ups forward lateral and step down    Consulted and Agree with Plan of Care  Patient       Patient will benefit from skilled therapeutic intervention in order to improve the following deficits and impairments:  Abnormal gait, Pain, Impaired sensation, Increased muscle spasms, Increased edema, Decreased skin integrity, Decreased strength, Difficulty walking, Decreased range of motion, Decreased mobility  Visit Diagnosis: Pain in right ankle and joints of right foot  Stiffness of right foot, not elsewhere  classified  Other abnormalities of gait and mobility  Muscle weakness (generalized)     Problem List There are no active problems to display for this patient.   Voncille Lo, PT Certified Exercise Expert for the Aging Adult  09/04/17 6:53 PM Phone: [phone] Fax: [phone]  [facility] [address] [place], [place], [address] Phone: [phone]   Fax:  [phone]  Name: Aaron Stevens MRN: [id number] Date of Birth: April 28, 1955

## 2017-09-04 NOTE — Patient Instructions (Signed)
Step Down: Anterior   From 4-6" step, reach one leg forward as far as possible, still able to return easily. Touch toe and heel to floor. Return to single leg balance. Repeat with other leg. Repeat __10 x 3__ times. Do _1___ sessions per day. At start, hold ____ pound dumbbells at: knee height. shoulder height. waist height. On return, bring weights: to waist. to shoulders. over head.  Copyright  VHI. All rights reserved.  Step-Up: Lateral   Step up to side with right leg. Bring other foot up onto _6___ inch step. Return to floor position with left leg. Repeat _10 x 3___ times per session. Do _1___ sessions per day. Repeat in dimly lit room. Repeat with eyes closed.  Copyright  VHI. All rights reserved.  Forward   Facing step, place one leg on step, flexed at hip. Step up slowly, bringing hips in line with knee and shoulder. Bring other foot onto step. Reverse process to step back down. Repeat with other leg. Do __10 x 2__ repetitions, ___1_ sets.  http://bt.exer.us/154   Copyright  VHI. All rights reserved.   Aaron Stevens, PT Certified Exercise Expert for the Aging Adult  09/04/17 12:19 PM Phone: [phone] Fax: [phone]

## 2017-09-05 ENCOUNTER — Encounter: Payer: Self-pay | Admitting: Physical Therapy

## 2017-09-05 ENCOUNTER — Ambulatory Visit: Payer: Worker's Compensation | Admitting: Physical Therapy

## 2017-09-06 ENCOUNTER — Encounter: Payer: Self-pay | Admitting: Physical Therapy

## 2017-09-06 ENCOUNTER — Ambulatory Visit: Payer: Worker's Compensation | Attending: Orthopedic Surgery | Admitting: Physical Therapy

## 2017-09-06 DIAGNOSIS — M25571 Pain in right ankle and joints of right foot: Secondary | ICD-10-CM | POA: Insufficient documentation

## 2017-09-06 DIAGNOSIS — R2689 Other abnormalities of gait and mobility: Secondary | ICD-10-CM | POA: Insufficient documentation

## 2017-09-06 DIAGNOSIS — M6281 Muscle weakness (generalized): Secondary | ICD-10-CM | POA: Diagnosis present

## 2017-09-06 DIAGNOSIS — M25674 Stiffness of right foot, not elsewhere classified: Secondary | ICD-10-CM | POA: Diagnosis present

## 2017-09-06 NOTE — Therapy (Signed)
[facility] [address] [place], [place], [address] Phone: [phone]   Fax:  [phone]  Physical Therapy Treatment  Patient Details  Name: Aaron Stevens MRN: [id number] Date of Birth: 05-03-1955 Referring Provider: Ollen Gross MD   Encounter Date: 09/06/2017  PT End of Session - 09/06/17 1205    Visit Number  5    Number of Visits  8    Date for PT Re-Evaluation  10/02/17    Authorization Type  self pay    PT Start Time  1102    PT Stop Time  1146    PT Time Calculation (min)  44 min    Activity Tolerance  Patient tolerated treatment well    Behavior During Therapy  [employer] for tasks assessed/performed       Past Medical History:  Diagnosis Date  . Atherosclerosis of artery   . Emphysema of lung (HCC)   . Pneumonia     Past Surgical History:  Procedure Laterality Date  . APPENDECTOMY    . KNEE SURGERY      There were no vitals filed for this visit.  Subjective Assessment - 09/06/17 1121    Subjective  When I stand for more than an hour I have burning in the middle of my bottom of foot to top of foot.  I want to be able to walk without a cane.     Patient is accompained by:  Family member    Pertinent History  Emphysema, left knee surgery > 20 years ago    Patient Stated Goals  I would like my foot better so I can walk normally and stand up longer than 30 miinutes to do stuff around house and to be able to get back to fishing and walk on uneven grounds, mow the yard    Currently in Pain?  Yes    Pain Score  4     Pain Location  Foot    Pain Orientation  Right    Pain Descriptors / Indicators  Numbness;Burning    Pain Type  Neuropathic pain;Chronic pain    Pain Radiating Towards  radiates into big toe    Pain Onset  More than a month ago    Pain Frequency  Constant         [facility] PT Assessment - 09/06/17 0001      AROM   Left Ankle Dorsiflexion  6    Left Ankle Plantar Flexion  40    Left Ankle  Inversion  28    Left Ankle Eversion  10                   [facility] Adult PT Treatment/Exercise - 09/06/17 1153      Ambulation/Gait   Ambulation/Gait  Yes    Assistive device  None    Gait Pattern  Decreased weight shift to right;Decreased stride length    Pre-Gait Activities  wt shifting side to side and stagger step    Gait Comments  Pt was able to be up for 17 minutes before burning pain in right foot.  Pt able to ambualate for 6 .5 continuous minutes and walk 2300 feet      Self-Care   Self-Care  Other Self-Care Comments    Other Self-Care Comments   desensitization of right foot with wash cloth and to perform ankle pumps , AROM in all planes.       Knee/Hip Exercises: Supine   Bridges  15 reps;2  sets;Strengthening;Both    Straight Leg Raises  15 reps;Right;2 sets;Strengthening      Knee/Hip Exercises: Sidelying   Clams  Right and left clams with red t band 2 x 10 bil      Ankle Exercises: Aerobic   Elliptical  2 minutes  level 1      Ankle Exercises: Standing   Heel Raises Limitations  25 x     Toe Raise Limitations  sink squat 2 x 15     Other Standing Ankle Exercises  standing heel raise with tennis ball squeeze for tib post activation             PT Education - 09/06/17 1129    Education Details  added eccentric heel raise/lowering and hip HEP and gait training    Person(s) Educated  Patient    Methods  Explanation;Tactile cues;Demonstration;Verbal cues;Handout    Comprehension  Verbalized understanding;Returned demonstration          PT Long Term Goals - 09/06/17 1138      PT LONG TERM GOAL #1   Title  "Pt will be independent with advanced HEP    Baseline  working  on standing and endurance exercises and pain management able to stand 17 min today but with burning pain in foot 7/10    Time  6    Period  Weeks    Status  On-going      PT LONG TERM GOAL #2   Title  "Pt will be able to negotiate steps without exacerbation of pain greater  than 4/10     Baseline  7/10 pain in right of bottom of foot and ankle and when dorsiflexing foot    Time  6    Period  Weeks    Status  On-going      PT LONG TERM GOAL #3   Title  "R ankle AROM flexion will improve to to DF of 10 degrees for improved mobility and to walk with more normalized gait    Baseline  R ankle AROM DF 6 degrees today and walking in tennis shoes now achieved today but will revise goal    Time  6    Period  Weeks    Status  Revised      PT LONG TERM GOAL #4   Title  Pt will be educated on pain management strategies in order to stand for 30 minutes at the house with LRAD toaccomplish home tasks    Baseline  trying to not use straight cane but with small steps. decreased stride length    Time  6    Period  Weeks    Status  On-going      PT LONG TERM GOAL #5   Title  Pt will be able to walk on unlevel grround with pain levele 4/10 or less in order to pursue fishing tasks on incline    Baseline  plans to go tomorrow but not yet    Time  6    Period  Weeks    Status  Unable to assess            Plan - 09/06/17 1201    Clinical Impression Statement  Pt initially at 4/10 and up to 7/ 10 burning pain in foot, Pt is however, improving in function and DF to 6 degrees.  Goal revised to 10 degrees for end goals today.  Pt able to stand for 17 min but wiht burning in ankle. Pt trying to ambulate  with shorter stride length and without can today with safety and cautioan    PT Frequency  1x / week    PT Duration  6 weeks    PT Treatment/Interventions  ADLs/Self Care Home Management;Cryotherapy;Electrical Stimulation;Iontophoresis 4mg /ml Dexamethasone;Moist Heat;Ultrasound;Contrast Bath;Balance training;Therapeutic exercise;Therapeutic activities;Functional mobility training;Stair training;Gait training;Neuromuscular re-education;Patient/family education;Manual techniques;Passive range of motion;Dry needling;Taping    PT Next Visit Plan  increase AROM of right Great toe,   mobilize toes as able . check orthotic and signs of redness, strengthen hips, and knees for proximal  stability  and gait train as able, desentization techniques for right foot, trying to ambulate for 17 minutes consecutive    PT Home Exercise Plan  AROM of right foot and great toe contrast bath, resisted Ankle with yellow tband and towel crunch gastroc strength sitting and standing step ups forward lateral and step down    Consulted and Agree with Plan of Care  Patient       Patient will benefit from skilled therapeutic intervention in order to improve the following deficits and impairments:  Abnormal gait, Pain, Impaired sensation, Increased muscle spasms, Increased edema, Decreased skin integrity, Decreased strength, Difficulty walking, Decreased range of motion, Decreased mobility  Visit Diagnosis: Pain in right ankle and joints of right foot  Stiffness of right foot, not elsewhere classified  Other abnormalities of gait and mobility  Muscle weakness (generalized)     Problem List There are no active problems to display for this patient.  Garen Lah, PT Certified Exercise Expert for the Aging Adult  09/06/17 12:08 PM Phone: [phone] Fax: [phone]  [facility] [address] [place], [place], [address] Phone: [phone]   Fax:  [phone]  Name: Lakin Romer MRN: [id number] Date of Birth: 09-08-1955

## 2017-09-06 NOTE — Patient Instructions (Addendum)
  Strengthening: Wall Slide   Leaning on wall, slowly lower buttocks until thighs are parallel to floor. Hold _10___ seconds. Tighten thigh muscles and return. Repeat _10___ times per set. Do _1___ sets per session. Do __1-2__ sessions per day.  http://orth.exer.us/630   Copyright  VHI. All rights reserved.     SINGLE LIMB STANCE   For you Mr. Kretschmer,  Heel raise with both feet and then gently lower the right foot down to help it grow stronger to lift by itself eventually.   10 reps x 2   Do this 2 times a day.      Copyright  VHI. All rights reserved.  HIP: Flexion / KNEE: Extension, Straight Leg Raise   Raise leg, keeping knee straight. Perform slowly. 10___ reps per set, __2_ sets per day, _7__ days per week    HIP: Abduction / External Rotation (Band)   Place band around knees. Lie on side with hips and knees bent. Raise top knee up, squeezing glutes. Keep feet together. Hold _3__ seconds. Use ___red_____ band. 15___ reps per set, _2__ sets per day, _7__ days per week  Right and left side  Bridge   Lie back, legs bent. Inhale, pressing hips up. Keeping ribs in, lengthen lower back. Exhale, rolling down along spine from top. Repeat ____ times. Do ____ sessions per day.  Garen LahLawrie Beardsley, PT Certified Exercise Expert for the Aging Adult  09/06/17 11:34 AM Phone: [phone] Fax: [phone]

## 2017-09-10 ENCOUNTER — Ambulatory Visit: Payer: Worker's Compensation | Attending: Orthopedic Surgery | Admitting: Physical Therapy

## 2017-09-10 ENCOUNTER — Encounter: Payer: Self-pay | Admitting: Physical Therapy

## 2017-09-10 DIAGNOSIS — R2689 Other abnormalities of gait and mobility: Secondary | ICD-10-CM

## 2017-09-10 DIAGNOSIS — M6281 Muscle weakness (generalized): Secondary | ICD-10-CM | POA: Diagnosis present

## 2017-09-10 DIAGNOSIS — M25674 Stiffness of right foot, not elsewhere classified: Secondary | ICD-10-CM | POA: Diagnosis present

## 2017-09-10 DIAGNOSIS — M25571 Pain in right ankle and joints of right foot: Secondary | ICD-10-CM | POA: Insufficient documentation

## 2017-09-10 NOTE — Therapy (Signed)
[facility] [address] [place], [place], [address] Phone: [phone]   Fax:  [phone]  Physical Therapy Treatment  Patient Details  Name: Aaron Stevens MRN: [id number] Date of Birth: [birth year] Referring Provider: Ollen GrossAluisio, Frank MD   Encounter Date: 09/10/2017  PT End of Session - 09/10/17 1630    Visit Number  6    Number of Visits  8    Date for PT Re-Evaluation  10/02/17    PT Start Time  1542    PT Stop Time  1628    PT Time Calculation (min)  46 min    Activity Tolerance  Patient tolerated treatment well    Behavior During Therapy  [facility] for tasks assessed/performed       Past Medical History:  Diagnosis Date  . Atherosclerosis of artery   . Emphysema of lung (HCC)   . Pneumonia     Past Surgical History:  Procedure Laterality Date  . APPENDECTOMY    . KNEE SURGERY      There were no vitals filed for this visit.  Subjective Assessment - 09/10/17 1548    Subjective  I do the contrast bath once a day.  pain gets worse by this time of day and as the dat goes on.     Patient is accompained by:  Family member   Wife in the lobby   Currently in Pain?  Yes    Pain Score  6     Pain Orientation  Right    Pain Descriptors / Indicators  Numbness;Burning;Pins and needles    Pain Type  Chronic pain;Neuropathic pain    Pain Radiating Towards  not in big to yet today    Aggravating Factors   doing alot on feet     Pain Relieving Factors  insoles,  being off foot                       [facility] Adult PT Treatment/Exercise - 09/10/17 0001      Knee/Hip Exercises: Supine   Quad Sets  10 reps   fatigue noted at 6th rep 2 second hold.   Bridges  15 reps;2 sets;Strengthening;Both    Straight Leg Raises  15 reps;Right;2 sets;Strengthening   cued to avoid bending knee     Knee/Hip Exercises: Sidelying   Clams  Right and left clams with red t band 2 x 10 bil      Manual Therapy   Manual Therapy   Soft tissue mobilization    Manual therapy comments  anterior foot soft tissue increased tingling,  noted edema ball of foot,  foot soft tissue rigid softened some    Joint Mobilization  A/P glides fibula  entire  tingling in toes noted distal 4 inches of fibula   mobs with movement  ankle into DF patient DF with Ankle mob   Soft tissue mobilization  toe PROM to 50 degrees              PT Education - 09/10/17 1630    Education Details  anatomy    Person(s) Educated  Patient    Methods  Explanation       PT Short Term Goals - 08/28/17 0904      PT SHORT TERM GOAL #1   Title  Pt will be independent with intial HEP    Time  2    Period  Weeks    Status  Achieved  PT SHORT TERM GOAL #2   Title  Pt will be educated on proper gait using assitive device    Baseline  cane use on level and steps    Time  2    Period  Weeks    Status  Achieved        PT Long Term Goals - 09/10/17 1604      PT LONG TERM GOAL #1   Title  "Pt will be independent with advanced HEP    Baseline  independent with exercises issued so far.    Time  6    Period  Weeks    Status  On-going      PT LONG TERM GOAL #2   Title  "Pt will be able to negotiate steps without exacerbation of pain greater than 4/10     Baseline  5/10 pain     Time  6    Period  Weeks    Status  On-going      PT LONG TERM GOAL #3   Title  "R ankle AROM flexion will improve to to DF of 10 degrees for improved mobility and to walk with more normalized gait    Time  6    Period  Weeks            Plan - 09/10/17 1728    Clinical Impression Statement  50 degrees great toe extension today.  Burning in foot  was decreased with moving tendons laterally at ankle.  DF continues anterior foot and lateral ankle pain decreased with walking.  Pain continued 6-7/10 at end of session. No signs of reddness from orthotics both feet checked.Function improving.     PT Next Visit Plan  increase AROM of right Great toe,  mobilize  toes as able . check orthotic and signs of redness, strengthen hips, and knees for proximal  stability  and gait train as able, desentization techniques for right foot, trying to ambulate for 17 minutes consecutive    PT Home Exercise Plan  AROM of right foot and great toe contrast bath, resisted Ankle with yellow tband and towel crunch gastroc strength sitting and standing step ups forward lateral and step down    Consulted and Agree with Plan of Care  Patient       Patient will benefit from skilled therapeutic intervention in order to improve the following deficits and impairments:     Visit Diagnosis: Pain in right ankle and joints of right foot  Stiffness of right foot, not elsewhere classified  Other abnormalities of gait and mobility  Muscle weakness (generalized)     Problem List There are no active problems to display for this patient.   Kashonda Sarkisyan PTA 09/10/2017, 5:36 PM  [facility] [address] [place], [place], [address] Phone: [phone]   Fax:  [phone]  Name: Aaron Stevens MRN: [id number] Date of Birth: 08/14/1955

## 2017-09-12 ENCOUNTER — Encounter: Payer: Self-pay | Admitting: Physical Therapy

## 2017-09-18 ENCOUNTER — Other Ambulatory Visit: Payer: Self-pay

## 2017-09-18 ENCOUNTER — Ambulatory Visit: Payer: Worker's Compensation | Attending: Orthopedic Surgery | Admitting: Physical Therapy

## 2017-09-18 ENCOUNTER — Encounter: Payer: Self-pay | Admitting: Physical Therapy

## 2017-09-18 DIAGNOSIS — M6281 Muscle weakness (generalized): Secondary | ICD-10-CM

## 2017-09-18 DIAGNOSIS — M25571 Pain in right ankle and joints of right foot: Secondary | ICD-10-CM | POA: Diagnosis not present

## 2017-09-18 DIAGNOSIS — R2689 Other abnormalities of gait and mobility: Secondary | ICD-10-CM | POA: Diagnosis present

## 2017-09-18 DIAGNOSIS — M25674 Stiffness of right foot, not elsewhere classified: Secondary | ICD-10-CM

## 2017-09-18 NOTE — Therapy (Signed)
[facility] [address] [place], [place], [address] Phone: [phone]   Fax:  [phone]  Physical Therapy Treatment/recertification   Patient Details  Name: Aaron Stevens MRN: [id number] Date of Birth: 01-31-1955 Referring Provider: Ollen GrossAluisio, Frank MD   Encounter Date: 09/18/2017  PT End of Session - 09/18/17 1334    Visit Number  7    Number of Visits  12   Date for PT Re-Evaluation  10/16/17    Authorization Type  self pay re certified for 4 additional weeks target 10-16-17    PT Start Time  1330    PT Stop Time  1414    PT Time Calculation (min)  44 min    Activity Tolerance  Patient tolerated treatment well    Behavior During Therapy  [facility] for tasks assessed/performed       Past Medical History:  Diagnosis Date  . Atherosclerosis of artery   . Emphysema of lung (HCC)   . Pneumonia     Past Surgical History:  Procedure Laterality Date  . APPENDECTOMY    . KNEE SURGERY      There were no vitals filed for this visit.  Subjective Assessment - 09/18/17 1337    Subjective  I know I am improving with my strength. but my pain is the same    Pertinent History  Emphysema, left knee surgery > 20 years ago    Limitations  Sitting;Standing;House hold activities    How long can you sit comfortably?  unlimited    How long can you stand comfortably?  20 minutes      How long can you walk comfortably?  20 minutes    Diagnostic tests  x ray    Patient Stated Goals  I would like my foot better so I can walk normally and stand up longer than 30 miinutes to do stuff around house and to be able to get back to fishing and walk on uneven grounds, mow the yard    Currently in Pain?  Yes    Pain Score  3    but this past weekend I was 10/10 after working in the yard   Pain Location  Foot    Pain Orientation  Right    Pain Descriptors / Indicators  Numbness;Burning    Pain Type  Chronic pain;Neuropathic pain    Pain Onset  More  than a month ago    Pain Frequency  Constant         [facility] PT Assessment - 09/18/17 1341      Assessment   Medical Diagnosis  R Metatarsal closed fx     Referring Provider  Ollen GrossAluisio, Frank MD    Onset Date/Surgical Date  03/26/17   injury at lumbar yard     Restrictions   Weight Bearing Restrictions  Yes    RLE Weight Bearing  Weight bearing as tolerated   right side   Other Position/Activity Restrictions  in regular shoes now      Balance Screen   Has the patient fallen in the past 6 months  No    Has the patient had a decrease in activity level because of a fear of falling?   No    Is the patient reluctant to leave their home because of a fear of falling?   No      Observation/Other Assessments   Observations  Pt LE right without mottling at first eval.  some edema noted in ankle  Circumferential Edema   Circumferential - Right  24.5 cm   around Great toe and metatarsals   Circumferential - Left   24 cm   taken in morning,sweeling worse at night     Functional Tests   Functional tests  Single leg stance;Squat;Lunges      Squat   Comments  able to wt bear equally and to 80 degrees.      Lunges   Comments  able to wt bear with right foot in front but difficulty coming up to standing with left foot in front, fair motor control in lunging maneuver      Single Leg Stance   Comments  15 sec without shoes with visible cocontraction on right.pt compenstates by leaning left leg into right;  left 40sec       ROM / Strength   AROM / PROM / Strength  AROM;Strength      AROM   Overall AROM Comments  right great toe 50 ext, 45 degrees flex on right  60 ext on left     Right Knee Extension  -2    Right Knee Flexion  140    Left Knee Extension  -3    Left Knee Flexion  145    Right Ankle Dorsiflexion  12    Right Ankle Plantar Flexion  50    Right Ankle Inversion  33    Right Ankle Eversion  12    Left Ankle Dorsiflexion  6    Left Ankle Plantar Flexion  40    Left  Ankle Inversion  28    Left Ankle Eversion  10      Strength   Right Hip Flexion  4+/5    Right Hip Extension  4+/5    Right Hip ABduction  4/5    Left Hip Flexion  4+/5    Left Hip Extension  4/5    Left Hip ABduction  4/5    Right Knee Flexion  4+/5   limited by pain in ankle   Right Knee Extension  4+/5    Left Knee Flexion  5/5    Left Knee Extension  5/5    Right Ankle Dorsiflexion  3-/5    Right Ankle Plantar Flexion  3/5    Right Ankle Inversion  3/5    Right Ankle Eversion  3/5    Left Ankle Dorsiflexion  5/5    Left Ankle Plantar Flexion  5/5    Left Ankle Inversion  5/5    Left Ankle Eversion  5/5                   [facility] Adult PT Treatment/Exercise - 09/18/17 1406      Knee/Hip Exercises: Standing   Other Standing Knee Exercises  lunge right and left 10 times each. pt tends to lunge  with forward trunk      Knee/Hip Exercises: Supine   Quad Sets  10 reps;Right    Bridges  15 reps;2 sets;Strengthening;Both   3/4 full range   Straight Leg Raises  15 reps;Right;2 sets;Strengthening   cued to avoid bending knee     Knee/Hip Exercises: Sidelying   Clams  Right and left clams with red t band 2 x 10 bil      Manual Therapy   Manual Therapy  Soft tissue mobilization    Joint Mobilization  A/P glide fibula    Soft tissue mobilization  toe PROM        Ankle Exercises:  Seated   Other Seated Ankle Exercises  LAQ with 4# x 15 right             PT Education - 09/18/17 1656    Education Details  reinforcement of exercises and neurological recovery/outcomes discussed added mini lunge today    Person(s) Educated  Patient    Methods  Explanation;Demonstration;Tactile cues;Verbal cues;Handout    Comprehension  Verbalized understanding;Returned demonstration       PT Short Term Goals - 08/28/17 0904      PT SHORT TERM GOAL #1   Title  Pt will be independent with intial HEP    Time  2    Period  Weeks    Status  Achieved      PT SHORT TERM GOAL  #2   Title  Pt will be educated on proper gait using assitive device    Baseline  cane use on level and steps    Time  2    Period  Weeks    Status  Achieved        PT Long Term Goals - 09/18/17 1351      PT LONG TERM GOAL #1   Title  "Pt will be independent with advanced HEP    Baseline  Pt would benefit from core training, proprioception exercises for neural deficits in right foot    Time  4    Period  Weeks    Status  On-going    Target Date  10/16/17      PT LONG TERM GOAL #2   Title  "Pt will be able to negotiate steps without exacerbation of pain greater than 2/10 or less    Baseline  4/10 today    Time  4    Period  Weeks    Status  Revised    Target Date  10/16/17      PT LONG TERM GOAL #3   Title  "R ankle AROM flexion will improve to to DF of 10 degrees for improved mobility and to walk with more normalized gait    Baseline  R ankle AROM DF 6 degrees today and walking in tennis shoes now      Time  4    Period  Weeks    Status  On-going    Target Date  10/16/17      PT LONG TERM GOAL #4   Title  Pt will be educated on pain management strategies in order to stand for 1 hour at home /or fishing for leasure activity    Time  4    Period  Weeks    Status  Revised    Target Date  10/16/17      PT LONG TERM GOAL #5   Title  Pt will be able to walk on unlevel grround with pain levele 3/10 or less in order to pursue fishing tasks on incline    Baseline  Pt will attempt to go fishing and increase standing    Time  --    Period  Weeks    Status  Unable to assess      PT LONG TERM GOAL #6   Title  I would like to stand for an hour in order to mow the lawn at home    Time  4    Period  Weeks    Status  New    Target Date  10/16/17            Plan - 09/18/17  1414    Clinical Impression Statement  Pt has been making good progress. Pt has nerve pain but has been making good progress in strength. today he has completed 7 session and would like to continue  for 5 addtional visits to maximize function. for better ambulation over unlevel surfaces.  Pt has deficits in single limb stance and strength.  He stilll has deficits in nerve pain but has made significant progress in right LE MMT and ROM.  Pt would benefit from continuing for at least 4 session to maximize potential and strengthen for prevention of falls due to single limb weakness.  Pt has been compliant with all exercises thus far and will benefit from skilled PT    Rehab Potential  Good    PT Frequency  1x / week    PT Duration  4 weeks    PT Treatment/Interventions  ADLs/Self Care Home Management;Cryotherapy;Electrical Stimulation;Iontophoresis 4mg /ml Dexamethasone;Moist Heat;Ultrasound;Contrast Bath;Balance training;Therapeutic exercise;Therapeutic activities;Functional mobility training;Stair training;Gait training;Neuromuscular re-education;Patient/family education;Manual techniques;Passive range of motion;Dry needling;Taping    PT Next Visit Plan  increase AROM of right Great toe,  mobilize toes as able . check orthotic and signs of redness, strengthen hips, and knees for proximal  stability  and gait train as able, desentization techniques for right foot, trying to ambulate for 17 minutes consecutive    PT Home Exercise Plan  AROM of right foot and great toe contrast bath, resisted Ankle with yellow tband and towel crunch gastroc strength sitting and standing step ups forward lateral and step down    Consulted and Agree with Plan of Care  Patient       Patient will benefit from skilled therapeutic intervention in order to improve the following deficits and impairments:  Abnormal gait, Pain, Impaired sensation, Increased muscle spasms, Increased edema, Decreased skin integrity, Decreased strength, Difficulty walking, Decreased range of motion, Decreased mobility  Visit Diagnosis: Pain in right ankle and joints of right foot  Stiffness of right foot, not elsewhere classified  Other  abnormalities of gait and mobility  Muscle weakness (generalized)     Problem List There are no active problems to display for this patient.   Garen Lah, PT Certified Exercise Expert for the Aging Adult  09/18/17 5:09 PM Phone: [phone] Fax: [phone]  [facility] [address] [place], [place], [address] Phone: [phone]   Fax:  [phone]  Name: Aaron Stevens MRN: [id number] Date of Birth: 02/27/1955

## 2017-09-18 NOTE — Patient Instructions (Signed)
Lunge    Place left leg forward. Bend both knees, keeping head and back straight. Hold _3___ seconds. Repeat _15___ times. Do _1-2___ sessions per day. Keep trunk upright.  May put pillow under knee  CAUTION: Move slowly. May lightly hold chair for stability.   Copyright  VHI. All rights reserved.   Garen LahLawrie Ollivander See, PT Certified Exercise Expert for the Aging Adult  09/18/17 2:13 PM Phone: [phone] Fax: [phone]

## 2017-09-19 ENCOUNTER — Ambulatory Visit: Payer: Worker's Compensation | Admitting: Physical Therapy

## 2017-09-19 ENCOUNTER — Encounter: Payer: Self-pay | Admitting: Physical Therapy

## 2017-09-19 DIAGNOSIS — M25571 Pain in right ankle and joints of right foot: Secondary | ICD-10-CM | POA: Diagnosis not present

## 2017-09-19 DIAGNOSIS — R2689 Other abnormalities of gait and mobility: Secondary | ICD-10-CM

## 2017-09-19 DIAGNOSIS — M25674 Stiffness of right foot, not elsewhere classified: Secondary | ICD-10-CM

## 2017-09-19 DIAGNOSIS — M6281 Muscle weakness (generalized): Secondary | ICD-10-CM

## 2017-09-19 NOTE — Therapy (Signed)
[facility] [address] [place], [place], [address] Phone: [phone]   Fax:  [phone]  Physical Therapy Treatment  Patient Details  Name: Raimundo Corbit MRN: [id number] Date of Birth: [birth year] Referring Provider: Ollen Gross MD   Encounter Date: 09/19/2017  PT End of Session - 09/19/17 1025    Visit Number  8    Number of Visits  11    Date for PT Re-Evaluation  10/16/17    PT Start Time  0804    PT Stop Time  0845    PT Time Calculation (min)  41 min    Activity Tolerance  Patient tolerated treatment well    Behavior During Therapy  [facility] for tasks assessed/performed       Past Medical History:  Diagnosis Date  . Atherosclerosis of artery   . Emphysema of lung (HCC)   . Pneumonia     Past Surgical History:  Procedure Laterality Date  . APPENDECTOMY    . KNEE SURGERY      There were no vitals filed for this visit.  Subjective Assessment - 09/19/17 0822    Subjective  I woke at 4 am with pain.     Patient is accompained by:  Family member   wife in lobby   Currently in Pain?  Yes    Pain Score  7     Pain Location  Foot    Pain Orientation  Right    Pain Descriptors / Indicators  Aching    Pain Type  Chronic pain;Neuropathic pain    Pain Radiating Towards  working toward big toe    Pain Frequency  Constant    Aggravating Factors   wakes,  weed wacking doing a lot    Pain Relieving Factors  pain killers                       [facility] Adult PT Treatment/Exercise - 09/19/17 0001      Ambulation/Gait   Ambulation/Gait  Yes    Gait Pattern  --   decreased trunk rotation   Gait Comments  multiple cues for gait which increased trunk rotation and at same time increased pain medial foot,  Gait and pain improved after tping.       Knee/Hip Exercises: Standing   Other Standing Knee Exercises  lunge right and left 10 times each. pt tends to lunge  with forward trunk   pad at knee on counter  for tibia position.      Knee/Hip Exercises: Supine   Quad Sets  20 reps;Right    Short Arc [employer]  20 reps    Short Arc Quad Sets Limitations  5 LBS    Bridges  15 reps;2 sets;Strengthening;Both   3/4 full range     Manual Therapy   Manual Therapy  Taping    Soft tissue mobilization  toe PROM      Kinesiotex  Facilitate Muscle;Ligament Correction      Kinesiotix   Facilitate Muscle   anterior tib    Ligament Correction  pull great toe into abduction      Ankle Exercises: Aerobic   Nustep  5.5 minutes L1  UE/LE             PT Education - 09/19/17 1025    Education Details  How tape works    Teacher, music) Educated  Patient    Methods  Explanation    Comprehension  Verbalized understanding  PT Short Term Goals - 08/28/17 0904      PT SHORT TERM GOAL #1   Title  Pt will be independent with intial HEP    Time  2    Period  Weeks    Status  Achieved      PT SHORT TERM GOAL #2   Title  Pt will be educated on proper gait using assitive device    Baseline  cane use on level and steps    Time  2    Period  Weeks    Status  Achieved        PT Long Term Goals - 09/18/17 1351      PT LONG TERM GOAL #1   Title  "Pt will be independent with advanced HEP    Baseline  Pt would benefit from core training, proprioception exercises for neural deficits in right foot    Time  4    Period  Weeks    Status  On-going    Target Date  10/16/17      PT LONG TERM GOAL #2   Title  "Pt will be able to negotiate steps without exacerbation of pain greater than 2/10 or less    Baseline  4/10 today    Time  4    Period  Weeks    Status  Revised    Target Date  10/16/17      PT LONG TERM GOAL #3   Title  "R ankle AROM flexion will improve to to DF of 10 degrees for improved mobility and to walk with more normalized gait    Baseline  R ankle AROM DF 6 degrees today and walking in tennis shoes now      Time  4    Period  Weeks    Status  On-going    Target Date  10/16/17       PT LONG TERM GOAL #4   Title  Pt will be educated on pain management strategies in order to stand for 1 hour at home /or fishing for leasure activity    Time  4    Period  Weeks    Status  Revised    Target Date  10/16/17      PT LONG TERM GOAL #5   Title  Pt will be able to walk on unlevel grround with pain levele 3/10 or less in order to pursue fishing tasks on incline    Baseline  Pt will attempt to go fishing and increase standing    Time  --    Period  Weeks    Status  Unable to assess      PT LONG TERM GOAL #6   Title  I would like to stand for an hour in order to mow the lawn at home    Time  4    Period  Weeks    Status  New    Target Date  10/16/17            Plan - 09/19/17 1026    Clinical Impression Statement  Pain varies from day to day, today pain woke him.  Trial of tape helpful  with gait(less pain) with neutral position great toe and DF assist.Pain increased with all activities however he participated fully during session.Deferred questions about back to work to his MD.    PT Next Visit Plan  Teach wift how to tape if helpful.  increase AROM of right Great toe,  mobilize  toes as able . check orthotic and signs of redness, strengthen hips, and knees for proximal  stability  and gait train as able, desentization techniques for right foot, trying to ambulate for 17 minutes consecutive    PT Home Exercise Plan  AROM of right foot and great toe contrast bath, resisted Ankle with yellow tband and towel crunch gastroc strength sitting and standing step ups forward lateral and step down    Consulted and Agree with Plan of Care  Patient       Patient will benefit from skilled therapeutic intervention in order to improve the following deficits and impairments:     Visit Diagnosis: Pain in right ankle and joints of right foot  Stiffness of right foot, not elsewhere classified  Other abnormalities of gait and mobility  Muscle weakness  (generalized)     Problem List There are no active problems to display for this patient.   Deaundre Allston PTA 09/19/2017, 11:29 AM  [facility] [address] [place], [place], [address] Phone: [phone]   Fax:  [phone]  Name: Edgardo RoysRonnie Meckley MRN: [id number] Date of Birth: 1955/11/28

## 2017-09-19 NOTE — Patient Instructions (Signed)
Remove tape if irritating 

## 2017-09-25 ENCOUNTER — Ambulatory Visit: Payer: Worker's Compensation | Attending: Orthopedic Surgery | Admitting: Physical Therapy

## 2017-09-25 ENCOUNTER — Encounter: Payer: Self-pay | Admitting: Physical Therapy

## 2017-09-25 DIAGNOSIS — R2689 Other abnormalities of gait and mobility: Secondary | ICD-10-CM | POA: Diagnosis present

## 2017-09-25 DIAGNOSIS — M25571 Pain in right ankle and joints of right foot: Secondary | ICD-10-CM | POA: Diagnosis not present

## 2017-09-25 DIAGNOSIS — M25674 Stiffness of right foot, not elsewhere classified: Secondary | ICD-10-CM | POA: Diagnosis present

## 2017-09-25 DIAGNOSIS — M6281 Muscle weakness (generalized): Secondary | ICD-10-CM | POA: Diagnosis present

## 2017-09-25 NOTE — Patient Instructions (Signed)
     Garen LahLawrie Shakthi Scipio, PT Certified Exercise Expert for the Aging Adult  09/25/17 11:27 AM Phone: [phone] Fax: [phone]

## 2017-09-25 NOTE — Therapy (Signed)
[facility] [place], [place], [address] Phone: [phone]   Fax:  [phone]  Physical Therapy Treatment  Patient Details  Name: Aaron Stevens MRN: [id number] Date of Birth: 1955/05/02 Referring Provider: Gaynelle Arabian MD   Encounter Date: 09/25/2017  PT End of Session - 09/25/17 1415    Visit Number  9    Number of Visits  11    Date for PT Re-Evaluation  10/16/17    Authorization Type  self pay re certified for 4 additional weeks target 10-16-17    PT Start Time  1102    PT Stop Time  1145    PT Time Calculation (min)  43 min    Activity Tolerance  Patient tolerated treatment well    Behavior During Therapy  [facility] for tasks assessed/performed       Past Medical History:  Diagnosis Date  . Atherosclerosis of artery   . Emphysema of lung ([facility])   . Pneumonia     Past Surgical History:  Procedure Laterality Date  . APPENDECTOMY    . KNEE SURGERY      There were no vitals filed for this visit.  Subjective Assessment - 09/25/17 1108    Subjective  My foot is hurting more today.  It is raining too. I got to see Dr. Wynelle Link and he wants to send me to a pain specialist    Patient is accompained by:  Family member   wife in waiting area   Pertinent History  Emphysema, left knee surgery > 20 years ago    Limitations  Sitting;Standing;House hold activities    How long can you sit comfortably?  unlimited    How long can you stand comfortably?  about an hour and that is all I can stand    How long can you walk comfortably?  20-30 minutes    Diagnostic tests  x ray    Patient Stated Goals  I would like my foot better so I can walk normally and stand up longer than 30 miinutes to do stuff around house and to be able to get back to fishing and walk on uneven grounds, mow the yard    Pain Score  6     Pain Location  Foot    Pain Orientation  Right    Pain Descriptors / Indicators  Aching    Pain Type   Neuropathic pain;Chronic pain    Pain Onset  More than a month ago    Pain Frequency  Constant                       [facility] Adult PT Treatment/Exercise - 09/25/17 1115      Ambulation/Gait   Ambulation/Gait  Yes    Gait Pattern  Step-through pattern    Ambulation Surface  Level    Gait Comments  cues for trunk rotation and arm swing for more normalized gait.  Pt  unable to effectively push off of right toe due to weakness  for 1000 feet      Therapeutic Activites    Other Therapeutic Activities  marbles in a cup with difficulty flexing great toe   pt tends to lodge marble between great toe and 2nd     Knee/Hip Exercises: Supine   Quad Sets  20 reps;Right    Short Arc Quad Sets  20 reps    Short Arc Quad Sets Limitations  5 LBS  Manual Therapy   Manual Therapy  Joint mobilization;Taping    Joint Mobilization  A/P glide fibula    Soft tissue mobilization  toe PROM      Kinesiotex  Facilitate Muscle;Ligament Correction      Kinesiotix   Facilitate Muscle   anterior tib    Ligament Correction  pull great toe into abduction      Ankle Exercises: Seated   Other Seated Ankle Exercises  marbles picked up with toes Great toe and second wth great difficulty  Also red t band right great toe flexion 2 x 10      Ankle Exercises: Supine   T-Band  great toe flexion with red t band 3 x 10      Ankle Exercises: Standing   Toe Raise Limitations  PT with standing toe dorsiflexion stretch 10 reps 10 sec hold      Other Standing Ankle Exercises  blue t band arond ankle right with lunge and mobilizing 15 x 2 , VC to keep pt with erect trunk and not flexing forward       Ankle Exercises: Aerobic   Elliptical  attempted for 2 minute but pt was unable to sustating due to pain in right foot              PT Education - 09/25/17 1400    Education Details  worked on Metall[employer] with added HEP with red t band    Person(s) Educated  Patient    Methods   Explanation;Demonstration;Tactile cues;Handout;Verbal cues    Comprehension  Verbalized understanding;Verbal cues required;Returned demonstration       PT Short Term Goals - 08/28/17 0904      PT SHORT TERM GOAL #1   Title  Pt will be independent with intial HEP    Time  2    Period  Weeks    Status  Achieved      PT SHORT TERM GOAL #2   Title  Pt will be educated on proper gait using assitive device    Baseline  cane use on level and steps    Time  2    Period  Weeks    Status  Achieved        PT Long Term Goals - 09/25/17 1401      PT LONG TERM GOAL #1   Title  "Pt will be independent with advanced HEP    Baseline  given more exercises for working toe strength today 09-25-17    Time  4    Period  Weeks    Status  On-going      PT LONG TERM GOAL #2   Title  "Pt will be able to negotiate steps without exacerbation of pain greater than 2/10 or less    Baseline  6/10 today in flare    Time  4    Period  Weeks    Status  Unable to assess      PT LONG TERM GOAL #3   Title  "R ankle AROM flexion will improve to to DF of 10 degrees for improved mobility and to walk with more normalized gait    Time  4    Period  Weeks    Status  Unable to assess      PT LONG TERM GOAL #4   Title  Pt will be educated on pain management strategies in order to stand for 1 hour at home /or fishing for leasure activity  Baseline  pt no longer using a cane but and only stand about 30 minutes now    Time  4    Period  Weeks    Status  Partially Met      PT LONG TERM GOAL #5   Title  Pt will be able to walk on unlevel grround with pain levele 3/10 or less in order to pursue fishing tasks on incline    Baseline  Pt attempting to go fishing but 6/10 today    Time  6    Period  Weeks    Status  On-going      PT LONG TERM GOAL #6   Title  I would like to stand for an hour in order to mow the lawn at home    Baseline  Pt will try to mow the lawn this coming week    Time  4    Period   Weeks    Status  On-going            Plan - 09/25/17 1405    Clinical Impression Statement  LTG goal #4 partially met, but 6/10 pain today limiting.  Pt saw Dr. Wynelle Link and he is very pleased with progress but notes that his skin is mottled and Mr. Henegar still have marked pain 6/10 even with better mobilitiy.  Pt willl be referred to a pain specialist  to deal with more complex pain syndrom but will continue last few visits of PT to maximize strength.  Pt today has difficulty flexing Great toe on right or putting marbles in a cup due to great toe flexion weakness.     Rehab Potential  Good    PT Frequency  1x / week    PT Duration  4 weeks    PT Treatment/Interventions  ADLs/Self Care Home Management;Cryotherapy;Electrical Stimulation;Iontophoresis '4mg'$ /ml Dexamethasone;Moist Heat;Ultrasound;Contrast Bath;Balance training;Therapeutic exercise;Therapeutic activities;Functional mobility training;Stair training;Gait training;Neuromuscular re-education;Patient/family education;Manual techniques;Passive range of motion;Dry needling;Taping    PT Next Visit Plan  Teach wift how to tape if helpful.  increase AROM of right Great toe,  mobilize toes as able . check orthotic and signs of redness, strengthen hips, and knees for proximal  stability  and gait train as able, desentization techniques for right foot, trying to ambulate for 17 minutes consecutive  2 more visits left before DC   PT Home Exercise Plan  AROM of right foot and great toe contrast bath, resisted Ankle with yellow tband and towel crunch gastroc strength sitting and standing step ups forward lateral and step down    Consulted and Agree with Plan of Care  Patient       Patient will benefit from skilled therapeutic intervention in order to improve the following deficits and impairments:  Abnormal gait, Pain, Impaired sensation, Increased muscle spasms, Increased edema, Decreased skin integrity, Decreased strength, Difficulty walking,  Decreased range of motion, Decreased mobility  Visit Diagnosis: Pain in right ankle and joints of right foot  Stiffness of right foot, not elsewhere classified  Other abnormalities of gait and mobility  Muscle weakness (generalized)     Problem List There are no active problems to display for this patient.  Voncille Lo, PT Certified Exercise Expert for the Aging Adult  09/25/17 2:16 PM Phone: [phone] Fax: [facility] [address] [place], [place], [address] Phone: [phone]   Fax:  [phone]  Name: Aaron Stevens MRN: [id number] Date of Birth: [birth year]

## 2017-10-02 ENCOUNTER — Encounter: Payer: Self-pay | Admitting: Physical Therapy

## 2017-10-02 ENCOUNTER — Ambulatory Visit: Payer: Worker's Compensation | Attending: Orthopedic Surgery | Admitting: Physical Therapy

## 2017-10-02 DIAGNOSIS — M25674 Stiffness of right foot, not elsewhere classified: Secondary | ICD-10-CM | POA: Diagnosis present

## 2017-10-02 DIAGNOSIS — R2689 Other abnormalities of gait and mobility: Secondary | ICD-10-CM | POA: Diagnosis present

## 2017-10-02 DIAGNOSIS — M6281 Muscle weakness (generalized): Secondary | ICD-10-CM | POA: Diagnosis present

## 2017-10-02 DIAGNOSIS — M25571 Pain in right ankle and joints of right foot: Secondary | ICD-10-CM

## 2017-10-02 NOTE — Therapy (Signed)
[place], [place], [address] Phone: [phone]   Fax:  [phone]  Physical Therapy Treatment/REAUTHORIZATION  Patient Details  Name: Cyril Woodmansee MRN: [id number] Date of Birth: [birth year] Referring Provider: Gaynelle Arabian MD   Encounter Date: 10/02/2017  PT End of Session - 10/02/17 1057    Visit Number  10    Number of Visits  24    Date for PT Re-Evaluation  11/20/17    Authorization Type  self pay re authorized for 12 additional visit plus original 12    Authorization Time Period  Target 11-27-17    PT Start Time  1100    PT Stop Time  1145    PT Time Calculation (min)  45 min    Activity Tolerance  Patient tolerated treatment well    Behavior During Therapy  [facility] for tasks assessed/performed       Past Medical History:  Diagnosis Date  . Atherosclerosis of artery   . Emphysema of lung ([place])   . Pneumonia     Past Surgical History:  Procedure Laterality Date  . APPENDECTOMY    . KNEE SURGERY      There were no vitals filed for this visit.  Subjective Assessment - 10/02/17 1110    Subjective  over the weekend my right foot was ice cold about 8/10.  It is a 4/10 right now.      Patient is accompained by:  Family member   wife   Pertinent History  Emphysema, left knee surgery > 20 years ago    Limitations  Sitting;Standing;House hold activities    How long can you sit comfortably?  unlimited    How long can you stand comfortably?  about an 45 minutues  hour and that is all I can stand    How long can you walk comfortably?  30 minutes    Diagnostic tests  x ray    Patient Stated Goals  I would like my foot better so I can walk normally and stand up longer than 30 miinutes to do stuff around house and to be able to get back to fishing and walk on uneven grounds, mow the yard    Currently in Pain?  Yes    Pain Score  4     Pain Location  Foot    Pain Orientation  Right    Pain  Descriptors / Indicators  Aching   feels like he is walking on water   Pain Type  Neuropathic pain;Chronic pain    Pain Onset  More than a month ago    Pain Frequency  Constant         [facility] PT Assessment - 10/02/17 1134      Assessment   Medical Diagnosis  R Metatarsal closed fx     Referring Provider  Gaynelle Arabian MD      Observation/Other Assessments   Observations  Pt LE right without mottling today as severe as first eval. at first eval.  some edema noted in ankle   Pt notes feeling feet with pain and ice cold at times     Sensation   Hot/Cold  Impaired by gross assessment   cant feel hot or cold when taking a bath on foot   Additional Comments  Pt complains of right foot feeling like " mush" like he is walking on top of water      Functional Tests   Functional tests  Single leg stance;Lunges  Lunges   Comments  able to wt bear with right foot in front but difficulty coming up to standing with left foot in front, fair motor control in lunging maneuver      Single Leg Stance   Comments  Pt 22 seconds in shoe on right  but unable to balance fo > 3 seconds on green  ther ex pad barefoot      Sit to Stand   Comments  Pt now able to come sit to stand without dependence on UE strength      AROM   Overall AROM Comments  right great toe 50 ext, 45 degrees flex on right  60 ext on left     Right Knee Extension  0    Right Knee Flexion  140    Left Knee Extension  0    Left Knee Flexion  145    Right Ankle Dorsiflexion  12    Right Ankle Plantar Flexion  50    Right Ankle Inversion  33    Right Ankle Eversion  12    Left Ankle Dorsiflexion  8    Left Ankle Plantar Flexion  40    Left Ankle Inversion  28    Left Ankle Eversion  12      Strength   Right Hip Flexion  4+/5    Right Hip Extension  4+/5    Right Hip ABduction  4/5    Left Hip Flexion  4/5    Left Hip ABduction  4-/5    Right Knee Flexion  4+/5   limited by pain in ankle   Right Knee Extension   4+/5    Left Knee Flexion  5/5    Left Knee Extension  5/5    Right Ankle Dorsiflexion  3-/5    Right Ankle Plantar Flexion  3/5    Right Ankle Inversion  3/5    Right Ankle Eversion  3/5    Left Ankle Dorsiflexion  5/5    Left Ankle Plantar Flexion  5/5    Left Ankle Inversion  5/5    Left Ankle Eversion  5/5      Palpation   Palpation comment  tenderness over right great toe sesamoid bones and tenderness over first ray with any bending, some swelling noted as well      Ambulation/Gait   Gait Comments  cues for trunk rotation and arm swing for more normalized gait.  Pt  unable to effectively push off of right toe due to weakness                     [facility] Adult PT Treatment/Exercise - 10/02/17 1134      Ambulation/Gait   Ambulation/Gait  Yes    Gait Pattern  Step-through pattern;Decreased stride length   decreased toe off on right foot   Ambulation Surface  Level    Pre-Gait Activities  worked on balance Single limb level and on ther ex green pad      Knee/Hip Exercises: Stretches   Hip Flexor Stretch  3 reps;30 seconds    Hip Flexor Stretch Limitations  left /right of side of mat       Knee/Hip Exercises: Standing   Other Standing Knee Exercises  lunge right and left 10 times each. pt tends to lunge  with forward trunk   pad at knee on counter for tibia position.      Knee/Hip Exercises: Supine   Quad Sets  20  reps;Right;Left    Short [employer]  20 reps    Short [employer] Limitations  5 lb    Bridges  15 reps;2 sets;Strengthening;Both   3/4 full range   Straight Leg Raises  Both;2 sets;10 reps      Knee/Hip Exercises: Sidelying   Hip ABduction  Right;1 set;10 reps      Knee/Hip Exercises: Prone   Hamstring Curl  10 reps;3 seconds    Hip Extension  Strengthening;2 sets;10 reps;Both      Modalities   Modalities  Iontophoresis      Iontophoresis   Type of Iontophoresis  Dexamethasone    Location  right foot over sesamoid bones    Dose  1 cc     Time  4-6 hour patch to be removed by 6 PM today      Manual Therapy   Manual Therapy  Joint mobilization;Taping    Soft tissue mobilization  toe PROM  every joint available for tolerance of movement      Ankle Exercises: Standing   Heel Raises  15 reps;Both   Single limb right x 10   Toe Raise Limitations  PT with standing toe dorsiflexion stretch 10 reps 10 sec hold        Ankle Exercises: Aerobic   Elliptical  attempted for 1 min and 45 seconds minute but pt was unable to sustating due to pain in right foot       Ankle Exercises: Seated   Towel Crunch  5 reps   lengthwise of towel   [place] Pickup   4 minutes    increasing toe flexion , decreased strength, drops marbles     Ankle Exercises: Supine   T-Band  great toe flexion with red t band 3 x 10   3 x 10 toe ext red t band            PT Education - 10/02/17 1207    Education Details  education on iontophoresis and added hip abduction in sidelying , reviewed HEP partially time limitation    Person(s) Educated  Patient    Methods  Explanation;Demonstration;Tactile cues;Verbal cues;Handout    Comprehension  Verbalized understanding;Returned demonstration       PT Short Term Goals - 08/28/17 0904      PT SHORT TERM GOAL #1   Title  Pt will be independent with intial HEP    Time  2    Period  Weeks    Status  Achieved      PT SHORT TERM GOAL #2   Title  Pt will be educated on proper gait using assitive device    Baseline  cane use on level and steps    Time  2    Period  Weeks    Status  Achieved        PT Long Term Goals - 10/02/17 1059      PT LONG TERM GOAL #1   Title  "Pt will be independent with advanced HEP    Baseline  given more exercises for working toe strength today , working on bil LE strength  R weaker than L    Time  8    Period  Weeks    Status  On-going    [place] Date  11/27/17      PT LONG TERM GOAL #2   Title  "Pt will be able to negotiate steps without exacerbation of pain  greater than 2/10 or less  Baseline  Pt in 4/10 pain today    Time  8    Period  Weeks    Status  Unable to assess      PT LONG TERM GOAL #3   Title  "R ankle AROM flexion will improve to to DF of 10 degrees for improved mobility and to walk with more normalized gait    Baseline  R ankle AROM DF 8 degrees today and walking in tennis shoes now      Time  8    Period  Weeks    Status  On-going    [place] Date  11/27/17      PT LONG TERM GOAL #4   Title  Pt will be educated on pain management strategies in order to stand for 1 hour at home /or fishing for leasure activity    Baseline  pt no longer using a cane but and only stand about 30 minutes now    Time  8    Status  Partially Met      PT LONG TERM GOAL #5   Title  Pt will be able to walk on unlevel grround with pain levele 3/10 or less in order to pursue fishing tasks on incline    Baseline  Pt unable to single limb stand without shoes for > 3 seconds to simulate unlevel groung    Time  8    Period  Weeks    Status  On-going    Target Date  11/27/17      PT LONG TERM GOAL #6   Title  I would like to stand for an hour in order to mow the lawn at home    Baseline  unable to [place] lawn at this time due to weakness and balance    Time  8    Status  On-going    [place] Date  11/27/17      PT LONG TERM GOAL #7   Title  Pt will be able to exercises on elliptical for 10 minutes without exacerbating pain greater than 3/10 or less    Baseline  Pt today can only tolerate 1 min 45 sec before pain goes to 8/10    Time  8   Period  Weeks    Status  New    Target Date  11/20/17      PT LONG TERM GOAL #8   Title  Pt will be able to stand on ther ex pad for > 15 seconds  barefoot to simulate walking on uneven ground with safety    Time  8    Period  Weeks    Status  New    Target Date  11/27/17      PT LONG TERM GOAL  #9   TITLE  "Pt will improve R ankle/ knee/hip extensor/flexor strength to >/= 4+/5 with </= 3/10 pain to promote  safety with walking/standing activities    Baseline  Pt now 4/10 to 8/10 and with  3 to 4-/5 in left LE    Time  8    Period  Weeks    Status  New    Target Date  11/27/17            Plan - 10/02/17 1147    Clinical Impression Statement  Mr. 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  Orlinda Blalock is  case manager and approved 12 adddtional visits as needed for strengthening. Pt is able to single limb stance in shoes on right for 22 sec but is unable to single limb stance on right on ther pad(simulation of ineven surface for greater than 3 sec. Pt cannot stand longer than 30 minutes and his goals are not achieved at this point and would benefit from addtional strength especially to Right LE which atrophied since accident. Pt will see pain specialist on September 10th Dr. Maryruth Eve. Pt expressed 4/10 pain today up to 8/10 pain over weekend and feet weere " ice cold" Pt with difficulty flexing great toe on the right and difficulty putting marbles in a cup due to decreased motor control and weakness.  Pt MMT on right at 3+/5 to 4-/5 with atrophy. Pt will benefit from additonal skilled PT for maximizing function/gait    Rehab Potential  Good    PT Frequency  2x / week   to max of 24 appt from eval   PT Duration  8 weeks    PT Treatment/Interventions  ADLs/Self Care Home Management;Cryotherapy;Electrical Stimulation;Iontophoresis 45m/ml Dexamethasone;Moist Heat;Ultrasound;Contrast Bath;Balance training;Therapeutic exercise;Therapeutic activities;Functional mobility training;Stair training;Gait training;Neuromuscular re-education;Patient/family education;Manual techniques;Passive range of motion;Dry needling;Taping    PT Next Visit Plan   check Iontophoresis over right sesamoidTeach wift how to tape if helpful.  increase AROM of right Great toe,  mobilize toes as able . check orthotic  and signs of redness, strengthen hips, and knees for proximal  stability  and gait train as able, desentization techniques for right foot, trying to ambulate for 17 minutes consecutive    PT Home Exercise Plan  AROM of right foot and great toe contrast bath, resisted Ankle with yellow tband and towel crunch gastroc strength sitting and standing step ups forward lateral and step down, Hip strengthening, knee strengthening    Consulted and Agree with Plan of Care  Patient       Patient will benefit from skilled therapeutic intervention in order to improve the following deficits and impairments:  Abnormal gait, Pain, Impaired sensation, Increased muscle spasms, Increased edema, Decreased skin integrity, Decreased strength, Difficulty walking, Decreased range of motion, Decreased mobility  Visit Diagnosis: Pain in right ankle and joints of right foot  Stiffness of right foot, not elsewhere classified  Other abnormalities of gait and mobility  Muscle weakness (generalized)     Problem List There are no active problems to display for this patient.   LVoncille Lo PT Certified Exercise Expert for the Aging Adult  10/02/17 12:27 PM Phone: [phone]: 3[facility]CG[facility]GCro[address] NAl[place] [address]Phone: [phone]  Fax:  [phone] Name: RBrennin DurfeeMRN: [id number] of Birth: 811/15/1957

## 2017-10-02 NOTE — Patient Instructions (Signed)

## 2017-10-09 ENCOUNTER — Ambulatory Visit: Payer: Worker's Compensation | Admitting: Physical Therapy

## 2017-10-09 ENCOUNTER — Encounter: Payer: Self-pay | Admitting: Physical Therapy

## 2017-10-09 DIAGNOSIS — M25674 Stiffness of right foot, not elsewhere classified: Secondary | ICD-10-CM

## 2017-10-09 DIAGNOSIS — R2689 Other abnormalities of gait and mobility: Secondary | ICD-10-CM

## 2017-10-09 DIAGNOSIS — M6281 Muscle weakness (generalized): Secondary | ICD-10-CM

## 2017-10-09 DIAGNOSIS — M25571 Pain in right ankle and joints of right foot: Secondary | ICD-10-CM

## 2017-10-09 NOTE — Therapy (Signed)
[place], [place], [address] Phone: [phone]   Fax:  [phone]  Physical Therapy Treatment  Patient Details  Name: Aaron Stevens MRN: [id number] Date of Birth: 01-16-1956 Referring Provider: Gaynelle Arabian MD   Encounter Date: 10/09/2017  PT End of Session - 10/09/17 0910    Visit Number  11    Number of Visits  24    Date for PT Re-Evaluation  11/20/17    Authorization - Visit Number  11    Authorization - Number of Visits  24    PT Start Time  0736    PT Stop Time  0805    PT Time Calculation (min)  29 min    Activity Tolerance  Patient tolerated treatment well    Behavior During Therapy  [facility] for tasks assessed/performed       Past Medical History:  Diagnosis Date  . Atherosclerosis of artery   . Emphysema of lung ([place])   . Pneumonia     Past Surgical History:  Procedure Laterality Date  . APPENDECTOMY    . KNEE SURGERY      There were no vitals filed for this visit.  Subjective Assessment - 10/09/17 0903    Subjective  I try to push off my big toe ,  but after awhile it turns to mush and swells and hurts.  No pain this morning.  i want manual vs wife learning to tape.  Patch was helpful.     Currently in Pain?  No/denies    Pain Location  Foot    Pain Orientation  Right    Pain Descriptors / Indicators  Aching    Pain Radiating Towards  medial distal foot    Pain Frequency  Intermittent    Aggravating Factors   walking more than an hour,  trying to push off with big toe    Pain Relieving Factors  patch,  manual.                         [facility] Adult PT Treatment/Exercise - 10/09/17 0001      Iontophoresis   Type of Iontophoresis  Dexamethasone    Location  right foot over sesamoid bones    Dose  1cc    Time  4-6 hour patch      Manual Therapy   Joint Mobilization  medial mid to distal foot P/A glides.  first ray stiff  mobility improved post manual.     also mobs to fibula and ankle with movement into DF   Soft tissue mobilization  foot also prom toes flexion with PF  PROM toes Extension with DF             PT Education - 10/09/17 0910    Education Details  anatomy,  what sesmoid bones look like,  fat pad thinning etc.     Person(s) Educated  Patient    Methods  Explanation;Demonstration    Comprehension  Verbalized understanding       PT Short Term Goals - 08/28/17 0904      PT SHORT TERM GOAL #1   Title  Pt will be independent with intial HEP    Time  2    Period  Weeks    Status  Achieved      PT SHORT TERM GOAL #2   Title  Pt will be educated on proper gait using assitive device  Baseline  cane use on level and steps    Time  2    Period  Weeks    Status  Achieved        PT Long Term Goals - 10/09/17 0915      PT LONG TERM GOAL #1   Title  "Pt will be independent with advanced HEP    Time  8    Period  Weeks    Status  Unable to assess      PT LONG TERM GOAL #2   Title  "Pt will be able to negotiate steps without exacerbation of pain greater than 2/10 or less    Time  8    Period  Weeks    Status  Unable to assess      PT LONG TERM GOAL #3   Title  "R ankle AROM flexion will improve to to DF of 10 degrees for improved mobility and to walk with more normalized gait    Baseline  did not measure  can do PROM    Time  8    Period  Weeks    Status  On-going      PT LONG TERM GOAL #4   Title  Pt will be educated on pain management strategies in order to stand for 1 hour at home /or fishing for leasure activity    Time  8    Period  Weeks    Status  Unable to assess      PT LONG TERM GOAL #5   Title  Pt will be able to walk on unlevel grround with pain levele 3/10 or less in order to pursue fishing tasks on incline    Time  8    Period  Weeks    Status  Unable to assess      PT LONG TERM GOAL #6   Title  I would like to stand for an hour in order to mow the lawn at home    Baseline  able to  stand 1 hour,  did not assess ability to [employer]    Time  8    Period  Weeks    Status  Partially Met      PT LONG TERM GOAL #7   Title  Pt will be able to exercises on elliptical for 10 minutes without exacerbating pain greater than 3/10 or less    Time  7    Period  Weeks    Status  Unable to assess      PT LONG TERM GOAL #8   Title  Pt will be able to stand on ther ex pad for > 15 seconds  barefoot to simulate walking on uneven ground with safety    Period  Weeks    Status  Unable to assess      PT LONG TERM GOAL  #9   TITLE  "Pt will improve R ankle/ knee/hip extensor/flexor strength to >/= 4+/5 with </= 3/10 pain to promote safety with walking/standing activities    Time  8    Period  Weeks    Status  Unable to assess            Plan - 10/09/17 0911    Clinical Impression Statement  1st ray stiff with limited movement anteriorly which increases pressure on sesmoid bones.  Less pain noted at end of session.  Did not tape at patients request to do ionto/  manual  he may benifit from altered  insert  to improve great toe mobility. LTG#6 partially met with being able to stand 1 hour.     PT Next Visit Plan  ionto.  Consider teaching wife manual and taping .  continue strengthening .    PT Home Exercise Plan  AROM of right foot and great toe contrast bath, resisted Ankle with yellow tband and towel crunch gastroc strength sitting and standing step ups forward lateral and step down, Hip strengthening, knee strengthening    Consulted and Agree with Plan of Care  Patient       Patient will benefit from skilled therapeutic intervention in order to improve the following deficits and impairments:     Visit Diagnosis: Pain in right ankle and joints of right foot  Stiffness of right foot, not elsewhere classified  Other abnormalities of gait and mobility  Muscle weakness (generalized)     Problem List There are no active problems to display for this  patient.   HARRIS,KAREN PTA 10/09/2017, 9:19 AM  [facility] [address] [place], [place], [address] Phone: [phone]   Fax:  [phone]  Name: Aaron Stevens MRN: [id number] Date of Birth: 02-12-1955

## 2017-10-10 ENCOUNTER — Encounter: Payer: Self-pay | Admitting: Physical Therapy

## 2017-10-10 ENCOUNTER — Ambulatory Visit: Payer: Worker's Compensation | Attending: Orthopedic Surgery | Admitting: Physical Therapy

## 2017-10-10 DIAGNOSIS — M6281 Muscle weakness (generalized): Secondary | ICD-10-CM

## 2017-10-10 DIAGNOSIS — M25674 Stiffness of right foot, not elsewhere classified: Secondary | ICD-10-CM | POA: Diagnosis present

## 2017-10-10 DIAGNOSIS — R2689 Other abnormalities of gait and mobility: Secondary | ICD-10-CM | POA: Diagnosis present

## 2017-10-10 DIAGNOSIS — M25571 Pain in right ankle and joints of right foot: Secondary | ICD-10-CM

## 2017-10-10 NOTE — Therapy (Signed)
[place], [place], [address] Phone: [phone]   Fax:  [phone]  Physical Therapy Treatment  Patient Details  Name: Aaron Stevens MRN: [id number] Date of Birth: 1955-09-27 Referring Provider: Gaynelle Arabian MD   Encounter Date: 10/10/2017  PT End of Session - 10/10/17 1811    Visit Number  12    Number of Visits  24    Date for PT Re-Evaluation  11/20/17    Authorization Type  self pay re authorized for 12 additional visit plus original 12    Authorization Time Period  Target 11-27-17    Authorization - Visit Number  12    Authorization - Number of Visits  24    PT Start Time  5176    PT Stop Time  1500    PT Time Calculation (min)  43 min    Activity Tolerance  Patient tolerated treatment well    Behavior During Therapy  [facility] for tasks assessed/performed       Past Medical History:  Diagnosis Date  . Atherosclerosis of artery   . Emphysema of lung ([place])   . Pneumonia     Past Surgical History:  Procedure Laterality Date  . APPENDECTOMY    . KNEE SURGERY      There were no vitals filed for this visit.  Subjective Assessment - 10/10/17 1805    Subjective  I am a little sore .  I was  a little active today.    Patient is accompained by:  Family member   Wife in lobby   Currently in Pain?  Yes    Pain Score  3     Pain Location  Foot    Pain Orientation  Right    Pain Descriptors / Indicators  Aching    Pain Type  Neuropathic pain;Chronic pain    Pain Radiating Towards  arch/ heel    Pain Frequency  Intermittent    Aggravating Factors   walking,  doing more things    Pain Relieving Factors  manual,  rest patch                       [facility] Adult PT Treatment/Exercise - 10/10/17 0001      Knee/Hip Exercises: Standing   Other Standing Knee Exercises  Single leg stand with leg swings 30  secocnds X 2 each in door way,   SLS 10 X each standing in door way.,   weight shifting exercises      Iontophoresis   Type of Iontophoresis  Dexamethasone    Location  right foot over sesamoid bones    Dose  1cc    Time  4-6 hour patch      Manual Therapy   Joint Mobilization  medial mid to distal foot P/A glides.  first ray stiff  mobility  guarded with more pain today  PROM great toe improving   also mobs to fibula and ankle with movement into DF   Soft tissue mobilization  foot also prom toes flexion with PF  PROM toes Extension with DF      Ankle Exercises: Supine   Other Supine Ankle Exercises  AROM,  isometric IV/EV,       Ankle Exercises: Stretches   Plantar Fascia Stretch Limitations  painful this afternoon    Other Stretch  pro stretch both DF/PF   10 x 5 seconds each,  painful PF  PT Education - 10/10/17 1811    Education Details  no       PT Short Term Goals - 08/28/17 0904      PT SHORT TERM GOAL #1   Title  Pt will be independent with intial HEP    Time  2    Period  Weeks    Status  Achieved      PT SHORT TERM GOAL #2   Title  Pt will be educated on proper gait using assitive device    Baseline  cane use on level and steps    Time  2    Period  Weeks    Status  Achieved        PT Long Term Goals - 10/09/17 0915      PT LONG TERM GOAL #1   Title  "Pt will be independent with advanced HEP    Time  8    Period  Weeks    Status  Unable to assess      PT LONG TERM GOAL #2   Title  "Pt will be able to negotiate steps without exacerbation of pain greater than 2/10 or less    Time  8    Period  Weeks    Status  Unable to assess      PT LONG TERM GOAL #3   Title  "R ankle AROM flexion will improve to to DF of 10 degrees for improved mobility and to walk with more normalized gait    Baseline  did not measure  can do PROM    Time  8    Period  Weeks    Status  On-going      PT LONG TERM GOAL #4   Title  Pt will be educated on pain management strategies in order to stand for 1 hour at home /or fishing  for leasure activity    Time  8    Period  Weeks    Status  Unable to assess      PT LONG TERM GOAL #5   Title  Pt will be able to walk on unlevel grround with pain levele 3/10 or less in order to pursue fishing tasks on incline    Time  8    Period  Weeks    Status  Unable to assess      PT LONG TERM GOAL #6   Title  I would like to stand for an hour in order to mow the lawn at home    Baseline  able to stand 1 hour,  did not assess ability to [employer]    Time  8    Period  Weeks    Status  Partially Met      PT LONG TERM GOAL #7   Title  Pt will be able to exercises on elliptical for 10 minutes without exacerbating pain greater than 3/10 or less    Time  7    Period  Weeks    Status  Unable to assess      PT LONG TERM GOAL #8   Title  Pt will be able to stand on ther ex pad for > 15 seconds  barefoot to simulate walking on uneven ground with safety    Period  Weeks    Status  Unable to assess      PT LONG TERM GOAL  #9   TITLE  "Pt will improve R ankle/ knee/hip extensor/flexor strength to >/= 4+/5  with </= 3/10 pain to promote safety with walking/standing activities    Time  8    Period  Weeks    Status  Unable to assess            Plan - 10/10/17 1812    Clinical Impression Statement  Patient more sore today and tolerated manual less.  Weight shifting continues to be a challange.  PROM toe extension WNL.      PT Next Visit Plan  ionto.  Consider teaching wife manual and taping .  continue strengthening .    PT Home Exercise Plan  AROM of right foot and great toe contrast bath, resisted Ankle with yellow tband and towel crunch gastroc strength sitting and standing step ups forward lateral and step down, Hip strengthening, knee strengthening    Consulted and Agree with Plan of Care  Patient       Patient will benefit from skilled therapeutic intervention in order to improve the following deficits and impairments:     Visit Diagnosis: Pain in right ankle and  joints of right foot  Stiffness of right foot, not elsewhere classified  Other abnormalities of gait and mobility  Muscle weakness (generalized)     Problem List There are no active problems to display for this patient.   Altair Appenzeller PTA 10/10/2017, 6:15 PM  [place], [place], [address] Phone: [phone]   Fax:  [phone]  Name: Aaron Stevens MRN: [id number] Date of Birth: Oct 25, 1955

## 2017-10-16 ENCOUNTER — Ambulatory Visit: Payer: Worker's Compensation | Admitting: Physical Therapy

## 2017-10-18 ENCOUNTER — Encounter: Payer: Self-pay | Admitting: Physical Therapy

## 2017-10-18 ENCOUNTER — Ambulatory Visit: Payer: Worker's Compensation | Attending: Orthopedic Surgery | Admitting: Physical Therapy

## 2017-10-18 DIAGNOSIS — R2689 Other abnormalities of gait and mobility: Secondary | ICD-10-CM

## 2017-10-18 DIAGNOSIS — M25571 Pain in right ankle and joints of right foot: Secondary | ICD-10-CM | POA: Diagnosis not present

## 2017-10-18 DIAGNOSIS — M25674 Stiffness of right foot, not elsewhere classified: Secondary | ICD-10-CM

## 2017-10-18 DIAGNOSIS — M6281 Muscle weakness (generalized): Secondary | ICD-10-CM

## 2017-10-18 NOTE — Therapy (Signed)
[place], [place], [address] Phone: [phone]   Fax:  [phone]  Physical Therapy Treatment  Patient Details  Name: Aaron Stevens MRN: [id number] Date of Birth: [birth year] Referring Provider: Gaynelle Arabian MD   Encounter Date: 10/18/2017  PT End of Session - 10/18/17 1539    Visit Number  13    Number of Visits  24    Date for PT Re-Evaluation  11/20/17    Authorization Type  self pay re authorized for 12 additional visit plus original 12    Authorization Time Period  Target 11-27-17    Authorization - Visit Number  13    Authorization - Number of Visits  24    PT Start Time  5056    PT Stop Time  1505    PT Time Calculation (min)  45 min    Activity Tolerance  Patient tolerated treatment well    Behavior During Therapy  [facility] for tasks assessed/performed       Past Medical History:  Diagnosis Date  . Atherosclerosis of artery   . Emphysema of lung ([place])   . Pneumonia     Past Surgical History:  Procedure Laterality Date  . APPENDECTOMY    . KNEE SURGERY      There were no vitals filed for this visit.  Subjective Assessment - 10/18/17 1524    Subjective  I am sore today.  I was on my feet a lot yesterday and I am still sore from it this morning.    Patient is accompained by:  Family member   wife in lobby   Currently in Pain?  Yes    Pain Score  4     Pain Location  Foot    Pain Orientation  Right    Pain Descriptors / Indicators  Aching;Throbbing;Pressure;Tender    Pain Type  Chronic pain;Neuropathic pain    Pain Frequency  Intermittent    Aggravating Factors   walkinf,  standing longer    Pain Relieving Factors  rest,  tape                       [facility] Adult PT Treatment/Exercise - 10/18/17 0001      Knee/Hip Exercises: Standing   Stairs  4 and 6 inches.  step to  and step over step pattern 1 hand assist on rail    Gait Training  Pad to met heads, last  4 to correct PF 1st ray.  Patient was able to have less pain with pad and was able to use great shoe enough to wrinkle shoe with toe off. decreased weightbearing continued .  ( improving)      Knee/Hip Exercises: Seated   Long Arc Quad  Strengthening;AROM      Knee/Hip Exercises: Supine   Straight Leg Raises  Strengthening;AROM      Knee/Hip Exercises: Sidelying   Hip ABduction  Strengthening;AROM      Manual Therapy   Joint Mobilization  ankle A/P glides with movement  and  fibula A/P glides  to increase DF    Soft tissue mobilization  light,  PROM toes  mid and forefoot P/A glides.  Noted 1st ray is in PF with neutral ankle.    manual made whole foot numb     [employer]   Ligament Correction  pull great toe into abduction,  also 50 % tension  1/3 tape at mid foot  PT Short Term Goals - 08/28/17 0904      PT SHORT TERM GOAL #1   Title  Pt will be independent with intial HEP    Time  2    Period  Weeks    Status  Achieved      PT SHORT TERM GOAL #2   Title  Pt will be educated on proper gait using assitive device    Baseline  cane use on level and steps    Time  2    Period  Weeks    Status  Achieved        PT Long Term Goals - 10/18/17 1449      PT LONG TERM GOAL #1   Title  "Pt will be independent with advanced HEP    Baseline  Patient was able to recite and demo HEP    Time  8    Period  Weeks    Status  On-going      PT LONG TERM GOAL #2   Title  "Pt will be able to negotiate steps without exacerbation of pain greater than 2/10 or less    Baseline  Today no pain on steps  step to, and step over step.  feels pressure ( post mantal)  Consistant?    Time  8    Period  Weeks    Status  Partially Met      PT LONG TERM GOAL #3   Baseline  0 degrees AROM at beginning of session.  8 degrees post manual    Time  8    Period  Weeks    Status  On-going      PT LONG TERM GOAL #4   Title  Pt will be educated on pain management strategies in  order to stand for 1 hour at home /or fishing for leasure activity    Baseline  Patient sits to manage pain.  Standing not yet 1 hour    Time  8    Period  Weeks    Status  On-going            Plan - 10/18/17 1527    Clinical Impression Statement  Patient was able to fish a little yesterday however his pain has flared today. ) degrees DF at beginning of session.  8 DF AROM at end of session.  Patient had less pain with gait at end of session post manual and the addition of a pad 2 through 5th met head.  (taping/  mobs)  he was able to use Great toe with gait for the first time.     PT Next Visit Plan  Assess met head pad.  Teach wife as needed.  Assess standing time. strengthening,  DF ROM.    PT Home Exercise Plan  AROM of right foot and great toe contrast bath, resisted Ankle with yellow tband and towel crunch gastroc strength sitting and standing step ups forward lateral and step down, Hip strengthening, knee strengthening    Consulted and Agree with Plan of Care  Patient       Patient will benefit from skilled therapeutic intervention in order to improve the following deficits and impairments:     Visit Diagnosis: Pain in right ankle and joints of right foot  Stiffness of right foot, not elsewhere classified  Other abnormalities of gait and mobility  Muscle weakness (generalized)     Problem List There are no active problems to display for this patient.  Miko Sirico PTA 10/18/2017, 3:40 PM  [facility] [address] [place], [place], [address] Phone: [phone]   Fax:  [phone]  Name: Aaron Stevens MRN: [id number] Date of Birth: 1955/10/18

## 2017-10-23 ENCOUNTER — Encounter: Payer: Self-pay | Admitting: Physical Therapy

## 2017-10-23 ENCOUNTER — Ambulatory Visit: Payer: Worker's Compensation | Attending: Orthopedic Surgery | Admitting: Physical Therapy

## 2017-10-23 DIAGNOSIS — M25571 Pain in right ankle and joints of right foot: Secondary | ICD-10-CM | POA: Diagnosis not present

## 2017-10-23 DIAGNOSIS — M25674 Stiffness of right foot, not elsewhere classified: Secondary | ICD-10-CM

## 2017-10-23 DIAGNOSIS — R2689 Other abnormalities of gait and mobility: Secondary | ICD-10-CM | POA: Diagnosis present

## 2017-10-23 DIAGNOSIS — M6281 Muscle weakness (generalized): Secondary | ICD-10-CM | POA: Insufficient documentation

## 2017-10-23 NOTE — Therapy (Signed)
[place], [place], [address] Phone: [phone]   Fax:  [phone]  Physical Therapy Treatment  Patient Details  Name: Aaron Stevens MRN: [id number] Date of Birth: 02/13/1955 Referring Provider: Gaynelle Arabian MD   Encounter Date: 10/23/2017  PT End of Session - 10/23/17 1125    Visit Number  14    Number of Visits  24    Date for PT Re-Evaluation  11/20/17    Authorization Type  self pay re authorized for 12 additional visit plus original 12    Authorization Time Period  Target 11-27-17    Authorization - Visit Number  14    Authorization - Number of Visits  24    PT Start Time  [phone]    PT Stop Time  0930    PT Time Calculation (min)  44 min    Activity Tolerance  Patient tolerated treatment well    Behavior During Therapy  [facility] for tasks assessed/performed       Past Medical History:  Diagnosis Date  . Atherosclerosis of artery   . Emphysema of lung ([place])   . Pneumonia     Past Surgical History:  Procedure Laterality Date  . APPENDECTOMY    . KNEE SURGERY      There were no vitals filed for this visit.  Subjective Assessment - 10/23/17 0903    Subjective  I woke up a 4/10.  Somrtimes It wakes me up.    Patient is accompained by:  Family member    Currently in Pain?  Yes    Pain Score  4    at end of day 9/10 after being on feet most of day   Pain Location  Foot    Pain Orientation  Right    Pain Descriptors / Indicators  Aching;Numbness;Tender    Pain Radiating Towards  heel arch    Pain Frequency  Intermittent    Aggravating Factors   standing longer,  walking    Pain Relieving Factors  rest tape pain meds,  padding on foot                       [facility] Adult PT Treatment/Exercise - 10/23/17 0001      Knee/Hip Exercises: Standing   Forward Step Up  Both;1 set;10 reps;Hand Hold: 1;Step Height: 6"    Step Down  10 reps   lateral ankle pain 7/10, ( better  with fibula assist posteri   Other Standing Knee Exercises  single leg stand   both 5-6 10   pain 7/10     Knee/Hip Exercises: Seated   Long Arc Quad  10 reps;Weights    Long Arc Quad Weight  5 lbs.   10 second hold eccentric focus     Manual Therapy   Manual therapy comments  education of wift taping technique.  patient able to do with cues.  Great to position.  She learns best by observing first then practiving with cues.  Also learned how to pad the 2-5 th met heads             PT Education - 10/23/17 1124    Education Details  taping,  Pad technique    Person(s) Educated  Patient;Spouse    Methods  Explanation;Demonstration;Tactile cues;Verbal cues    Comprehension  Verbalized understanding;Returned demonstration       PT Short Term Goals - 08/28/17 3557      PT  SHORT TERM GOAL #1   Title  Pt will be independent with intial HEP    Time  2    Period  Weeks    Status  Achieved      PT SHORT TERM GOAL #2   Title  Pt will be educated on proper gait using assitive device    Baseline  cane use on level and steps    Time  2    Period  Weeks    Status  Achieved        PT Long Term Goals - 10/23/17 1129      PT LONG TERM GOAL #1   Title  "Pt will be independent with advanced HEP    Baseline  independent with exercises so far    Time  8    Period  Weeks    Status  On-going      PT LONG TERM GOAL #2   Title  "Pt will be able to negotiate steps without exacerbation of pain greater than 2/10 or less    Time  8    Period  Weeks    Status  On-going      PT LONG TERM GOAL #3   Title  "R ankle AROM flexion will improve to to DF of 10 degrees for improved mobility and to walk with more normalized gait    Time  8    Period  Weeks    Status  Unable to assess      PT LONG TERM GOAL #4   Title  Pt will be educated on pain management strategies in order to stand for 1 hour at home /or fishing for leasure activity    Baseline  Able to stand 1 hour with pain    Time   8    Period  Weeks    Status  Partially Met      PT LONG TERM GOAL #5   Title  Pt will be able to walk on unlevel grround with pain levele 3/10 or less in order to pursue fishing tasks on incline    Baseline  Pt unable to single limb stand without shoes for > 3 seconds to simulate unlevel groung    Time  8    Period  Weeks    Status  Unable to assess      PT LONG TERM GOAL #6   Title  I would like to stand for an hour in order to mow the lawn at home    Baseline  able to stand 1 hour,  did not assess ability to [employer]    Time  8    Period  Weeks    Status  Partially Met      PT LONG TERM GOAL #7   Title  Pt will be able to exercises on elliptical for 10 minutes without exacerbating pain greater than 3/10 or less    Time  7    Period  Weeks    Status  Unable to assess      PT LONG TERM GOAL #8   Title  Pt will be able to stand on ther ex pad for > 15 seconds  barefoot to simulate walking on uneven ground with safety    Time  8    Period  Weeks    Status  Unable to assess      PT LONG TERM GOAL  #9   TITLE  "Pt will improve R ankle/ knee/hip extensor/flexor strength  to >/= 4+/5 with </= 3/10 pain to promote safety with walking/standing activities    Time  8    Period  Weeks    Status  Unable to assess            Plan - 10/23/17 1126    Clinical Impression Statement   STG# 4 partially met with being able to stand 1 hour. (Consistant?)Education focus today with wife learning how to tape.  Min cues needed.  She feels she will be able to do at home.  Pain decreased with gait with taping and met head pad.      PT Next Visit Plan  Answer any questions about taping.  Work toward goals.     PT Home Exercise Plan  AROM of right foot and great toe contrast bath, resisted Ankle with yellow tband and towel crunch gastroc strength sitting and standing step ups forward lateral and step down, Hip strengthening, knee strengthening    Consulted and Agree with Plan of Care   Patient;Family member/caregiver    Family Member Consulted  Wife       Patient will benefit from skilled therapeutic intervention in order to improve the following deficits and impairments:     Visit Diagnosis: Pain in right ankle and joints of right foot  Stiffness of right foot, not elsewhere classified  Other abnormalities of gait and mobility  Muscle weakness (generalized)     Problem List There are no active problems to display for this patient.   Ansley Mangiapane PTA 10/23/2017, 11:35 AM  [facility] [address] [place], [place], [address] Phone: [phone]   Fax:  [phone]  Name: Maximum Reiland MRN: [id number] Date of Birth: [birth year]

## 2017-10-25 ENCOUNTER — Ambulatory Visit: Payer: Worker's Compensation | Admitting: Physical Therapy

## 2017-10-25 ENCOUNTER — Encounter: Payer: Self-pay | Admitting: Physical Therapy

## 2017-10-25 DIAGNOSIS — R2689 Other abnormalities of gait and mobility: Secondary | ICD-10-CM

## 2017-10-25 DIAGNOSIS — M6281 Muscle weakness (generalized): Secondary | ICD-10-CM

## 2017-10-25 DIAGNOSIS — M25674 Stiffness of right foot, not elsewhere classified: Secondary | ICD-10-CM

## 2017-10-25 DIAGNOSIS — M25571 Pain in right ankle and joints of right foot: Secondary | ICD-10-CM | POA: Diagnosis not present

## 2017-10-25 NOTE — Therapy (Signed)
[place], [place], [address] Phone: [phone]   Fax:  [phone]  Physical Therapy Treatment  Patient Details  Name: Aaron Stevens MRN: [id number] Date of Birth: 1955-02-10 Referring Provider: Gaynelle Arabian MD   Encounter Date: 10/25/2017  PT End of Session - 10/25/17 0930    Visit Number  15    Number of Visits  24    Date for PT Re-Evaluation  11/20/17    Authorization Type  self pay re authorized for 12 additional visit plus original 12    Authorization Time Period  Target 11-27-17    Authorization - Visit Number  15    Authorization - Number of Visits  24    PT Start Time  0845    PT Stop Time  0928    PT Time Calculation (min)  43 min    Activity Tolerance  Patient tolerated treatment well    Behavior During Therapy  [facility] for tasks assessed/performed       Past Medical History:  Diagnosis Date  . Atherosclerosis of artery   . Emphysema of lung ([place])   . Pneumonia     Past Surgical History:  Procedure Laterality Date  . APPENDECTOMY    . KNEE SURGERY      There were no vitals filed for this visit.  Subjective Assessment - 10/25/17 0844    Subjective  Pain woke me at 4 am this morning.  I went fishing a little yesterday , but I sat down.  I got over the soreness from painting.    Patient is accompained by:  Family member   Wife in lobby   Currently in Pain?  Yes    Pain Score  7     Pain Location  Foot    Pain Orientation  Right;Upper    Pain Descriptors / Indicators  Aching;Numbness;Tender    Pain Type  Chronic pain;Neuropathic pain    Pain Frequency  Intermittent    Aggravating Factors   not sure why today.  walking longer,  standing longer    Pain Relieving Factors  rest tape,  pain meds padding                       [facility] Adult PT Treatment/Exercise - 10/25/17 0001      Knee/Hip Exercises: Standing   Hip Flexion  10 reps;Both    Hip Abduction  10  reps;Both    Stairs  steps 6 inches.  mix of step to and step over    Other Standing Knee Exercises  squat X 8 holding countes      Knee/Hip Exercises: Seated   Long Arc Quad  10 reps;Weights    Long Arc Quad Weight  5 lbs.   with ball squeeze   Sit to [employer]  10 reps      Ankle Exercises: Aerobic   Recumbent Bike  5 minutes      Ankle Exercises: Supine   Isometrics  IV 10 X 5 seconds with ball both,  ER isometricswith black band tied small  issued for home.    Other Supine Ankle Exercises  toe curls/extension AROm 10 X,  foot shortening 10 X    Other Supine Ankle Exercises  4 way manually resisted 10 x ach.  DF painful               PT Short Term Goals - 08/28/17 8786  PT SHORT TERM GOAL #1   Title  Pt will be independent with intial HEP    Time  2    Period  Weeks    Status  Achieved      PT SHORT TERM GOAL #2   Title  Pt will be educated on proper gait using assitive device    Baseline  cane use on level and steps    Time  2    Period  Weeks    Status  Achieved        PT Long Term Goals - 10/25/17 0912      PT LONG TERM GOAL #1   Title  "Pt will be independent with advanced HEP    Baseline  independent with exercises so far    Time  8    Period  Weeks    Status  On-going      PT LONG TERM GOAL #2   Title  "Pt will be able to negotiate steps without exacerbation of pain greater than 2/10 or less    Baseline  Today no pain on steps  step to, and step over step.  feels pressure ( post mantal)  Consistant?      PT LONG TERM GOAL #3   Title  "R ankle AROM flexion will improve to to DF of 10 degrees for improved mobility and to walk with more normalized gait    Baseline  8    Time  8    Period  Weeks    Status  On-going      PT LONG TERM GOAL #4   Title  Pt will be educated on pain management strategies in order to stand for 1 hour at home /or fishing for leasure activity    Baseline  Able to stand 1 hour with pain    Time  8    Period  Weeks     Status  Partially Met      PT LONG TERM GOAL #5   Title  Pt will be able to walk on unlevel grround with pain levele 3/10 or less in order to pursue fishing tasks on incline    Baseline  4 to 7-10     Time  8    Period  Weeks    Status  On-going      PT LONG TERM GOAL #6   Title  I would like to stand for an hour in order to mow the lawn at home    Baseline  lasted 1/2 hour mowing grass  9/10 pain    Time  8    Period  Weeks    Status  On-going      PT LONG TERM GOAL #7   Title  Pt will be able to exercises on elliptical for 10 minutes without exacerbating pain greater than 3/10 or less    Time  7    Period  Weeks    Status  Unable to assess      PT LONG TERM GOAL #8   Title  Pt will be able to stand on ther ex pad for > 15 seconds  barefoot to simulate walking on uneven ground with safety    Time  8    Period  Weeks    Status  Unable to assess      PT LONG TERM GOAL  #9   TITLE  "Pt will improve R ankle/ knee/hip extensor/flexor strength to >/= 4+/5 with </= 3/10 pain to  promote safety with walking/standing activities    Time  8    Period  Weeks    Status  Unable to assess            Plan - 10/25/17 0930    Clinical Impression Statement  8 degrees AROM DF prior to manual.  pain flare anterior foot with exercise. Noted tissue in arch  (RT> LT)  pouches with foot shortening exercise,  Pain 7/10 at end of session.  patient going to pain management 2 OCT.     PT Next Visit Plan  try elliptical,  SLS    PT Home Exercise Plan  AROM of right foot and great toe contrast bath, resisted Ankle with yellow tband and towel crunch gastroc strength sitting and standing step ups forward lateral and step down, Hip strengthening, knee strengthening    Consulted and Agree with Plan of Care  Patient       Patient will benefit from skilled therapeutic intervention in order to improve the following deficits and impairments:     Visit Diagnosis: Pain in right ankle and joints of right  foot  Stiffness of right foot, not elsewhere classified  Other abnormalities of gait and mobility  Muscle weakness (generalized)     Problem List There are no active problems to display for this patient.   HARRIS,KAREN PTA 10/25/2017, 9:33 AM  [facility] [address] [place], [place], [address] Phone: [phone]   Fax:  [phone]  Name: Kunal Levario MRN: [id number] Date of Birth: 01/21/1956

## 2017-10-30 ENCOUNTER — Encounter: Payer: Self-pay | Admitting: Physical Therapy

## 2017-10-31 ENCOUNTER — Ambulatory Visit: Payer: Worker's Compensation | Attending: Orthopedic Surgery | Admitting: Physical Therapy

## 2017-10-31 ENCOUNTER — Encounter: Payer: Self-pay | Admitting: Physical Therapy

## 2017-10-31 DIAGNOSIS — R2689 Other abnormalities of gait and mobility: Secondary | ICD-10-CM | POA: Diagnosis present

## 2017-10-31 DIAGNOSIS — M25571 Pain in right ankle and joints of right foot: Secondary | ICD-10-CM | POA: Insufficient documentation

## 2017-10-31 DIAGNOSIS — M6281 Muscle weakness (generalized): Secondary | ICD-10-CM | POA: Diagnosis present

## 2017-10-31 DIAGNOSIS — M25674 Stiffness of right foot, not elsewhere classified: Secondary | ICD-10-CM | POA: Insufficient documentation

## 2017-10-31 NOTE — Therapy (Signed)
[place], [place], [address] Phone: [phone]   Fax:  [phone]  Physical Therapy Treatment  Patient Details  Name: Aaron Stevens MRN: [id number] Date of Birth: 1955/05/11 Referring Provider (PT): Gaynelle Arabian MD   Encounter Date: 10/31/2017  PT End of Session - 10/31/17 0942    Visit Number  16    Number of Visits  24    Date for PT Re-Evaluation  11/20/17    Authorization - Visit Number  16    Authorization - Number of Visits  24    PT Start Time  0845    PT Stop Time  0930    PT Time Calculation (min)  45 min    Activity Tolerance  Patient tolerated treatment well    Behavior During Therapy  [facility] for tasks assessed/performed       Past Medical History:  Diagnosis Date  . Atherosclerosis of artery   . Emphysema of lung ([place])   . Pneumonia     Past Surgical History:  Procedure Laterality Date  . APPENDECTOMY    . KNEE SURGERY      There were no vitals filed for this visit.  Subjective Assessment - 10/31/17 0905    Subjective  If I do a lot the day before I wake up with pain.  My wife was able to tape me and she did real good.     Currently in Pain?  Yes    Pain Score  8    milder now,     Pain Location  Foot    Pain Orientation  Right;Upper    Pain Descriptors / Indicators  Burning;Sharp   feels loike someone stuck a knife   Aggravating Factors   manual    Multiple Pain Sites  --   knee sore long standong.        [facility] PT Assessment - 10/31/17 0001      AROM   Right Ankle Dorsiflexion  12                   [facility] Adult PT Treatment/Exercise - 10/31/17 0001      Knee/Hip Exercises: Standing   Hip Flexion  3 sets   30 seconds each with 2 squats between   Functional Squat  10 reps    Wall Squat  10 reps   with ball squeeze for hip position   Wall Squat Limitations  cued for equal weight shift.      Manual Therapy   Joint Mobilization  great toe  extension  mobs with movement,  ROM gradually increased until sharp burning pain limited,  Rays P/A glides 1 and 2 tender.    Fibula posterior glides   to increase DF      [employer]   Ligament Correction  wife did a great job with tape to great toe.       Ankle Exercises: Stretches   Gastroc Stretch Limitations  3 X 30 seconds on rocker board ( foot flat tolerated better.       Ankle Exercises: Standing   Rocker Board Limitations  PF/DF 30 X  comfortable,  felt strengthening without pain      Ankle Exercises: Aerobic   Recumbent Bike  6 minutes L1      Ankle Exercises: Supine   Other Supine Ankle Exercises  toe curls/ shortening 10 x each  AROM  PT Education - 10/31/17 0941    Education Details  exercis technique    Person(s) Educated  Patient    Methods  Explanation;Demonstration;Verbal cues    Comprehension  Verbalized understanding;Returned demonstration       PT Short Term Goals - 08/28/17 0904      PT SHORT TERM GOAL #1   Title  Pt will be independent with intial HEP    Time  2    Period  Weeks    Status  Achieved      PT SHORT TERM GOAL #2   Title  Pt will be educated on proper gait using assitive device    Baseline  cane use on level and steps    Time  2    Period  Weeks    Status  Achieved        PT Long Term Goals - 10/25/17 0912      PT LONG TERM GOAL #1   Title  "Pt will be independent with advanced HEP    Baseline  independent with exercises so far    Time  8    Period  Weeks    Status  On-going      PT LONG TERM GOAL #2   Title  "Pt will be able to negotiate steps without exacerbation of pain greater than 2/10 or less    Baseline  Today no pain on steps  step to, and step over step.  feels pressure ( post mantal)  Consistant?      PT LONG TERM GOAL #3   Title  "R ankle AROM flexion will improve to to DF of 10 degrees for improved mobility and to walk with more normalized gait    Baseline  8    Time  8    Period  Weeks     Status  On-going      PT LONG TERM GOAL #4   Title  Pt will be educated on pain management strategies in order to stand for 1 hour at home /or fishing for leasure activity    Baseline  Able to stand 1 hour with pain    Time  8    Period  Weeks    Status  Partially Met      PT LONG TERM GOAL #5   Title  Pt will be able to walk on unlevel grround with pain levele 3/10 or less in order to pursue fishing tasks on incline    Baseline  4 to 7-10     Time  8    Period  Weeks    Status  On-going      PT LONG TERM GOAL #6   Title  I would like to stand for an hour in order to mow the lawn at home    Baseline  lasted 1/2 hour mowing grass  9/10 pain    Time  8    Period  Weeks    Status  On-going      PT LONG TERM GOAL #7   Title  Pt will be able to exercises on elliptical for 10 minutes without exacerbating pain greater than 3/10 or less    Time  7    Period  Weeks    Status  Unable to assess      PT LONG TERM GOAL #8   Title  Pt will be able to stand on ther ex pad for > 15 seconds  barefoot to simulate walking on uneven ground  with safety    Time  8    Period  Weeks    Status  Unable to assess      PT LONG TERM GOAL  #9   TITLE  "Pt will improve R ankle/ knee/hip extensor/flexor strength to >/= 4+/5 with </= 3/10 pain to promote safety with walking/standing activities    Time  8    Period  Weeks    Status  Unable to assess            Plan - 10/31/17 0942    Clinical Impression Statement  Patient continues to stiffen between sessions wit DF eventually improving to 12.  He was able to work on calf strength comfortable on the rocker board due to being able to [place] it with the foot straight. pain flare with manual  Pain anterior foot and through great toe .   pain adderssed with pad to last 4 met heads.      PT Next Visit Plan  try elliptical,  SLS Work toward goals.   PT Home Exercise Plan  AROM of right foot and great toe contrast bath, resisted Ankle with yellow tband and  towel crunch gastroc strength sitting and standing step ups forward lateral and step down, Hip strengthening, knee strengthening    Consulted and Agree with Plan of Care  Patient       Patient will benefit from skilled therapeutic intervention in order to improve the following deficits and impairments:     Visit Diagnosis: Pain in right ankle and joints of right foot  Stiffness of right foot, not elsewhere classified  Other abnormalities of gait and mobility  Muscle weakness (generalized)     Problem List There are no active problems to display for this patient.   Debbrah Sampedro PTA 10/31/2017, 10:10 AM  [facility] [address] [place], [place], [address] Phone: [phone]   Fax:  [phone]  Name: Aaron Stevens MRN: [id number] Date of Birth: 10/15/1955

## 2017-11-01 ENCOUNTER — Encounter: Payer: Self-pay | Admitting: Physical Therapy

## 2017-11-01 ENCOUNTER — Ambulatory Visit: Payer: Worker's Compensation | Admitting: Physical Therapy

## 2017-11-01 DIAGNOSIS — M6281 Muscle weakness (generalized): Secondary | ICD-10-CM

## 2017-11-01 DIAGNOSIS — M25674 Stiffness of right foot, not elsewhere classified: Secondary | ICD-10-CM

## 2017-11-01 DIAGNOSIS — M25571 Pain in right ankle and joints of right foot: Secondary | ICD-10-CM

## 2017-11-01 DIAGNOSIS — R2689 Other abnormalities of gait and mobility: Secondary | ICD-10-CM

## 2017-11-01 NOTE — Therapy (Signed)
[place], [place], [address] Phone: [phone]   Fax:  [phone]  Physical Therapy Treatment  Patient Details  Name: Aaron Stevens MRN: [id number] Date of Birth: [birth year] Referring Provider (PT): Gaynelle Arabian MD   Encounter Date: 11/01/2017  PT End of Session - 11/01/17 1325    Visit Number  17    Number of Visits  24    Date for PT Re-Evaluation  11/20/17    Authorization - Visit Number  21    Authorization - Number of Visits  24    PT Start Time  6712    PT Stop Time  0928    PT Time Calculation (min)  41 min    Activity Tolerance  Patient tolerated treatment well    Behavior During Therapy  [facility] for tasks assessed/performed       Past Medical History:  Diagnosis Date  . Atherosclerosis of artery   . Emphysema of lung ([place])   . Pneumonia     Past Surgical History:  Procedure Laterality Date  . APPENDECTOMY    . KNEE SURGERY      There were no vitals filed for this visit.  Subjective Assessment - 11/01/17 0852    Subjective  Doing OK today.    Patient is accompained by:  Family member    Currently in Pain?  Yes    Pain Score  4     Pain Location  Foot    Pain Orientation  Right;Lateral   Ankle   Pain Descriptors / Indicators  Dull    Pain Type  Chronic pain;Neuropathic pain    Pain Frequency  Intermittent    Aggravating Factors   Manual.  wakes that way.  not sure.      Pain Relieving Factors  rest,  tape,  padding to foot                       [facility] Adult PT Treatment/Exercise - 11/01/17 0001      Self-Care   Other Self-Care Comments   pad replaced met heads 1-4.      Knee/Hip Exercises: Standing   Stairs  6 inch step to with pain      Cryotherapy   Cryotherapy Location  Knee   left concurrent with supine exercises right foot     Ankle Exercises: Stretches   Gastroc Stretch Limitations  3 X 30 seconds on rocker board ( foot flat tolerated  better.       Ankle Exercises: Standing   SLS  4 X 15 on pad with shoe  increased pain to 4/10 dorsum foot     Rocker Board Limitations  PF/DF 30 X  comfortable,  felt strengthening without pain      Ankle Exercises: Supine   Other Supine Ankle Exercises  toe curls/ shortening 10 x each  AROM   cued to curl in DF for comfort   Other Supine Ankle Exercises  4 way manually resisted 10 x ach.  DF painful      Ankle Exercises: Aerobic   Elliptical  4 minutes no foot pain.  Knee pain with this.      Recumbent Bike  5 minutes L1   cued hip to decrease knee pain            PT Education - 11/01/17 1324    Education Details  how to pad met heads    Person(s) Educated  Patient    Methods  Explanation;Demonstration    Comprehension  Verbalized understanding       PT Short Term Goals - 08/28/17 0904      PT SHORT TERM GOAL #1   Title  Pt will be independent with intial HEP    Time  2    Period  Weeks    Status  Achieved      PT SHORT TERM GOAL #2   Title  Pt will be educated on proper gait using assitive device    Baseline  cane use on level and steps    Time  2    Period  Weeks    Status  Achieved        PT Long Term Goals - 11/01/17 1327      PT LONG TERM GOAL #1   Title  "Pt will be independent with advanced HEP    Baseline  independent with exercises so far    Time  8    Period  Weeks    Status  On-going      PT LONG TERM GOAL #2   Title  "Pt will be able to negotiate steps without exacerbation of pain greater than 2/10 or less    Baseline  pain on steps if he already has pain,  step to today    Time  8    Period  Weeks    Status  On-going      PT LONG TERM GOAL #3   Title  "R ankle AROM flexion will improve to to DF of 10 degrees for improved mobility and to walk with more normalized gait    Time  8    Period  Weeks    Status  Unable to assess      PT LONG TERM GOAL #4   Title  Pt will be educated on pain management strategies in order to stand for 1  hour at home /or fishing for leasure activity    Time  8    Period  Weeks    Status  Unable to assess      PT LONG TERM GOAL #5   Title  Pt will be able to walk on unlevel grround with pain levele 3/10 or less in order to pursue fishing tasks on incline    Time  8    Period  Weeks    Status  Unable to assess      PT LONG TERM GOAL #6   Title  I would like to stand for an hour in order to mow the lawn at home    Time  8    Status  Unable to assess      PT LONG TERM GOAL #7   Title  Pt will be able to exercises on elliptical for 10 minutes without exacerbating pain greater than 3/10 or less    Baseline  4 minutes no foot pain,  knee pain 4-5/10    Time  7    Period  Weeks    Status  On-going      PT LONG TERM GOAL #8   Title  Pt will be able to stand on ther ex pad for > 15 seconds  barefoot to simulate walking on uneven ground with safety    Time  8    Period  Weeks    Status  On-going      PT LONG TERM GOAL  #9   TITLE  "Pt will improve  R ankle/ knee/hip extensor/flexor strength to >/= 4+/5 with </= 3/10 pain to promote safety with walking/standing activities    Time  8    Period  Weeks    Status  Unable to assess            Plan - 11/01/17 1325    Clinical Impression Statement  Patient tolerated 4 minutes on elliptical without foot pain.  Left knee pain was limiting. SLS continues to be painful 4-5/10 with hands for support.  progress toward elliptical goal.    PT Next Visit Plan  continue elliptical,  SLS    PT Home Exercise Plan  AROM of right foot and great toe contrast bath, resisted Ankle with yellow tband and towel crunch gastroc strength sitting and standing step ups forward lateral and step down, Hip strengthening, knee strengthening    Consulted and Agree with Plan of Care  Patient       Patient will benefit from skilled therapeutic intervention in order to improve the following deficits and impairments:     Visit Diagnosis: Pain in right ankle and  joints of right foot  Stiffness of right foot, not elsewhere classified  Other abnormalities of gait and mobility  Muscle weakness (generalized)     Problem List There are no active problems to display for this patient.   Kala Gassmann  PTA 11/01/2017, 1:30 PM  [place], [place], [address] Phone: [phone]   Fax:  [phone]  Name: Aaron Stevens MRN: [id number] Date of Birth: 02-15-1955

## 2017-11-06 ENCOUNTER — Encounter: Payer: Self-pay | Admitting: Physical Therapy

## 2017-11-06 ENCOUNTER — Ambulatory Visit: Payer: Worker's Compensation | Admitting: Physical Therapy

## 2017-11-06 DIAGNOSIS — M25571 Pain in right ankle and joints of right foot: Secondary | ICD-10-CM | POA: Diagnosis not present

## 2017-11-06 DIAGNOSIS — M6281 Muscle weakness (generalized): Secondary | ICD-10-CM

## 2017-11-06 DIAGNOSIS — R2689 Other abnormalities of gait and mobility: Secondary | ICD-10-CM

## 2017-11-06 DIAGNOSIS — M25674 Stiffness of right foot, not elsewhere classified: Secondary | ICD-10-CM

## 2017-11-06 NOTE — Therapy (Signed)
[facility] [address] [place], [place], [address] Phone: [phone]   Fax:  [phone]  Physical Therapy Treatment  Patient Details  Name: Aaron Stevens MRN: [id number] Date of Birth: 11/23/1955 Referring Provider (PT): Ollen Gross MD   Encounter Date: 11/06/2017  PT End of Session - 11/06/17 0847    Visit Number  18    Number of Visits  24    Date for PT Re-Evaluation  11/20/17    Authorization Type  self pay re authorized for 12 additional visit plus original 12    Authorization Time Period  [employer] 11-27-17    Authorization - Visit Number  18    Authorization - Number of Visits  24    PT Start Time  0845    PT Stop Time  0928    PT Time Calculation (min)  43 min    Activity Tolerance  Patient tolerated treatment well    Behavior During Therapy  [facility] for tasks assessed/performed       Past Medical History:  Diagnosis Date  . Atherosclerosis of artery   . Emphysema of lung ([facility])   . Pneumonia     Past Surgical History:  Procedure Laterality Date  . APPENDECTOMY    . KNEE SURGERY      There were no vitals filed for this visit.  Subjective Assessment - 11/06/17 0849    Subjective  I went to the pain doctor in [facility] and I haven't heard anything in a month.  the case manager was not very kind.  Wife reports that Nurse case manager had " words" with her and the nurse case manager has not worked well wilth [place] or contacted [place] in a month.    Patient is accompained by:  Family member   wife   Pertinent History  Emphysema, left knee surgery > 20 years ago    Limitations  Sitting;Standing;House hold activities    How long can you sit comfortably?  unlimited    How long can you stand comfortably?  about an 1hour    How long can you walk comfortably?  about an 1 hour    Diagnostic tests  x ray    Patient Stated Goals  I would like my foot better so I can walk normally and stand up longer than 30  miinutes to do stuff around house and to be able to get back to fishing and walk on uneven grounds, mow the yard    Currently in Pain?  Yes    Pain Score  4    at best it is a 4/10   Pain Location  Foot    Pain Orientation  Right;Lateral    Pain Descriptors / Indicators  Dull    Pain Type  Chronic pain;Neuropathic pain    Pain Onset  More than a month ago    Pain Frequency  Intermittent         [facility] PT Assessment - 11/06/17 0001      Assessment   Medical Diagnosis  R Metatarsal closed fx     Referring Provider (PT)  Ollen Gross MD      AROM   Right Knee Extension  0    Right Knee Flexion  143    Left Knee Extension  0    Left Knee Flexion  145    Right Ankle Dorsiflexion  9   left and right reversed. RIght is injured leg   Right Ankle Plantar  Flexion  40    Right Ankle Inversion  28    Right Ankle Eversion  22    Left Ankle Dorsiflexion  12    Left Ankle Plantar Flexion  44    Left Ankle Inversion  31    Left Ankle Eversion  30      Strength   Overall Strength  Deficits    Right Hip Flexion  4+/5    Right Hip Extension  4+/5    Right Hip ABduction  4/5    Left Hip Flexion  4+/5    Left Hip Extension  4+/5    Left Hip ABduction  4/5    Right Knee Flexion  4+/5    Right Knee Extension  5/5    Left Knee Flexion  5/5    Left Knee Extension  5/5    Right Ankle Dorsiflexion  4-/5    Right Ankle Plantar Flexion  4/5    Right Ankle Inversion  4-/5    Right Ankle Eversion  4-/5    Left Ankle Dorsiflexion  5/5    Left Ankle Plantar Flexion  5/5    Left Ankle Inversion  5/5    Left Ankle Eversion  5/5      Palpation   Palpation comment  tenderness over right great toe sesamoid bones and tenderness over first ray with any bending      Ambulation/Gait   Gait Comments  Pt now walking with normalized arm swing  but can only tolerate 4 minutes on the elliptical due to pain                   [facility] Adult PT Treatment/Exercise - 11/06/17 0001       Knee/Hip Exercises: Standing   Hip Flexion  3 sets   30 seconds each with 2 squats between   Functional Squat  10 reps    Wall Squat  10 reps   with ball squeeze for hip position   Wall Squat Limitations  cued for equal weight shift.    Stairs  6 inch step to with pain    Other Standing Knee Exercises  squat X 8 holding counter      Knee/Hip Exercises: Supine   Bridges  2 sets;Strengthening;Both    Single Leg Bridge  2 sets;Strengthening;Right;20 reps      Cryotherapy   Cryotherapy Location  Knee   left concurrent with supine exercises right foot     Manual Therapy   Manual Therapy  Joint mobilization    Joint Mobilization  ankle A/P glides with movement  and  fibula A/P glides  to increase DF      Ankle Exercises: Stretches   Gastroc Stretch Limitations  3 X 30 seconds on rocker board ( foot flat tolerated better.       Ankle Exercises: Supine   Other Supine Ankle Exercises  --   cued to curl in DF for comfort   Other Supine Ankle Exercises  4 way manually resisted 10 x ach.  DF painful      Ankle Exercises: Standing   Heel Raises  20 reps    Other Standing Ankle Exercises  ther ex 15 sec x 5 , pt with visible co contraction and upper trunk compensations      Ankle Exercises: Aerobic   Elliptical  4 minutes with no pain and then begins 4/10 to 7/10  PT Short Term Goals - 08/28/17 0904      PT SHORT TERM GOAL #1   Title  Pt will be independent with intial HEP    Time  2    Period  Weeks    Status  Achieved      PT SHORT TERM GOAL #2   Title  Pt will be educated on proper gait using assitive device    Baseline  cane use on level and steps    Time  2    Period  Weeks    Status  Achieved        PT Long Term Goals - 11/06/17 0859      PT LONG TERM GOAL #1   Title  "Pt will be independent with advanced HEP    Baseline  independent with exercises so far    Time  8    Period  Weeks    Status  On-going      PT LONG TERM GOAL #2   Title   "Pt will be able to negotiate steps without exacerbation of pain greater than 2/10 or less    Baseline  Pt never has  better than 4/10 with any movment of pain due to nerve pain    Time  8    Period  Weeks    Status  On-going      PT LONG TERM GOAL #3   Title  "R ankle AROM flexion will improve to to DF of 10 degrees for improved mobility and to walk with more normalized gait    Baseline  Right ankle 9 degrees with stiffness in ankle and pain 4/10 at minimum/ decreased stride length on right    Time  8    Period  Weeks    Status  On-going      PT LONG TERM GOAL #4   Title  Pt will be educated on pain management strategies in order to stand for 1 hour at home /or fishing for leasure activity    Baseline  Able to stand 1 hour with pain at 4/10 verbally reported    Time  8    Period  Weeks    Status  Unable to assess      PT LONG TERM GOAL #5   Title  Pt will be able to walk on unlevel grround with pain levele 3/10 or less in order to pursue fishing tasks on incline    Baseline  4 to 7-10  pain normally , never less than 4/10    Time  8    Period  Weeks    Status  On-going      PT LONG TERM GOAL #6   Title  I would like to stand for an hour in order to mow the lawn at home    Baseline  lasted 1/2 hour mowing grass Pt goes from a 4/10 to a 7/10    Time  8    Period  Weeks    Status  Unable to assess      PT LONG TERM GOAL #7   Title  Pt will be able to exercises on elliptical for 10 minutes without exacerbating pain greater than 3/10 or less    Baseline  4 minutes no foot pain,  knee pain 4-5/10    Time  7    Period  Weeks    Status  On-going            Plan - 11/06/17 1808  Clinical Impression Statement  Pt DF on right injured foot 9 degrees with stiffness.  Please note that right foot is injured foot with sensory and motor deficits throughout PT RX.    Pt has 4/10 to 7/10 foot pain and never is pain free. Pt has difficulty maintaining more than 15 seconds in SLS. Pt  has improved with MMT for LE's but still has need for strengthening for right foot pain and LE.  AROM has greatly improved since evaluation but main problem is pain control no matter what interventions are used.  Pt woulld benefit from a pain management speciallist to assess ongoing right foot injury/ crush injury with long term immobiilzation.  Will conitnue to maximize functional goals remaining. Mr. Gater has made remarkable progress from evaluation but is very concerned about his foot ever recovering from sensory deficits    Rehab Potential  Good    PT Frequency  2x / week    PT Duration  8 weeks    PT Treatment/Interventions  ADLs/Self Care Home Management;Cryotherapy;Electrical Stimulation;Iontophoresis 4mg /ml Dexamethasone;Moist Heat;Ultrasound;Contrast Bath;Balance training;Therapeutic exercise;Therapeutic activities;Functional mobility training;Stair training;Gait training;Neuromuscular re-education;Patient/family education;Manual techniques;Passive range of motion;Dry needling;Taping    PT Next Visit Plan  continue elliptical,  SLS LE strength    PT Home Exercise Plan  AROM of right foot and great toe contrast bath, resisted Ankle with yellow tband and towel crunch gastroc strength sitting and standing step ups forward lateral and step down, Hip strengthening, knee strengthening    Consulted and Agree with Plan of Care  Patient;Family member/caregiver   wife   Family Member Consulted  Wife       Patient will benefit from skilled therapeutic intervention in order to improve the following deficits and impairments:  Abnormal gait, Pain, Impaired sensation, Increased muscle spasms, Increased edema, Decreased skin integrity, Decreased strength, Difficulty walking, Decreased range of motion, Decreased mobility  Visit Diagnosis: Pain in right ankle and joints of right foot  Stiffness of right foot, not elsewhere classified  Other abnormalities of gait and mobility  Muscle weakness  (generalized)     Problem List There are no active problems to display for this patient.   Garen Lah, PT Certified Exercise Expert for the Aging Adult  11/06/17 6:20 PM Phone: [phone] Fax: [phone]  [facility] [address] [place], [place], [address] Phone: [phone]   Fax:  [phone]  Name: Aaron Stevens MRN: [id number] Date of Birth: [birth year]

## 2017-11-08 ENCOUNTER — Encounter: Payer: Self-pay | Admitting: Physical Therapy

## 2017-11-08 ENCOUNTER — Ambulatory Visit: Payer: Worker's Compensation | Admitting: Physical Therapy

## 2017-11-08 DIAGNOSIS — M6281 Muscle weakness (generalized): Secondary | ICD-10-CM

## 2017-11-08 DIAGNOSIS — M25674 Stiffness of right foot, not elsewhere classified: Secondary | ICD-10-CM

## 2017-11-08 DIAGNOSIS — R2689 Other abnormalities of gait and mobility: Secondary | ICD-10-CM

## 2017-11-08 DIAGNOSIS — M25571 Pain in right ankle and joints of right foot: Secondary | ICD-10-CM | POA: Diagnosis not present

## 2017-11-08 NOTE — Therapy (Signed)
[facility] [address] [place], [place], [address] Phone: [phone]   Fax:  [phone]  Physical Therapy Treatment  Patient Details  Name: Aaron Stevens MRN: [id number] Date of Birth: [birth year] Referring Provider (PT): Ollen Gross MD   Encounter Date: 11/08/2017  PT End of Session - 11/08/17 1130    Visit Number  19    Number of Visits  24    Date for PT Re-Evaluation  11/20/17    Authorization Type  self pay re authorized for 12 additional visit plus original 12    Authorization Time Period  Target 11-27-17    Authorization - Visit Number  19    Authorization - Number of Visits  24    PT Start Time  [phone]    PT Stop Time  0930    PT Time Calculation (min)  47 min    Activity Tolerance  Patient tolerated treatment well    Behavior During Therapy  [facility] for tasks assessed/performed       Past Medical History:  Diagnosis Date  . Atherosclerosis of artery   . Emphysema of lung (HCC)   . Pneumonia     Past Surgical History:  Procedure Laterality Date  . APPENDECTOMY    . KNEE SURGERY      There were no vitals filed for this visit.  Subjective Assessment - 11/08/17 0853    Patient is accompained by:  Family member   wife                      [facility] Adult PT Treatment/Exercise - 11/08/17 0001      Self-Care   Self-Care  Other Self-Care Comments    Other Self-Care Comments   discussed community wellness  options and  importance of exercise      Knee/Hip Exercises: Standing   Hip Abduction  Stengthening;Both;2 sets;20 reps   against wall for good alignment   Lateral Step Up  Hand Hold: 1;20 reps;Right;1 set;Step Height: 6"    Forward Step Up  Right;20 reps;Hand Hold: 1;Step Height: 6"    Functional Squat  10 reps   goblet squat with 25 lb x 10    Other Standing Knee Exercises  Forward with lunge and blue med ball and rotation. ( pt with unsteadiness due to senory issue of right foot.  Pt then doing forward lunges for 40 feet x 3 holding onto counter for balance      Knee/Hip Exercises: Supine   Bridges  2 sets;Strengthening;Both    Single Leg Bridge  2 sets;Strengthening;Right;20 reps      Ankle Exercises: Seated   Other Seated Ankle Exercises  great toe flexion with green t band cx 20 resistance      Ankle Exercises: Stretches   Gastroc Stretch Limitations  3 X 30 seconds on rocker board ( foot flat tolerated better.       Ankle Exercises: Standing   SLS  4 X 15 on pad with shoe  increased pain to 5/10 dorsum foot     Heel Raises  20 reps      Ankle Exercises: Machines for Strengthening   Cybex Leg Press  120 lb s sets of 10 with rest,               PT Education - 11/08/17 0931    Education Details  added advanced exercises to HEP in preparation for DC    Person(s) Educated  Patient  Methods  Explanation;Demonstration;Tactile cues;Verbal cues;Handout    Comprehension  Verbalized understanding;Returned demonstration       PT Short Term Goals - 08/28/17 0904      PT SHORT TERM GOAL #1   Title  Pt will be independent with intial HEP    Time  2    Period  Weeks    Status  Achieved      PT SHORT TERM GOAL #2   Title  Pt will be educated on proper gait using assitive device    Baseline  cane use on level and steps    Time  2    Period  Weeks    Status  Achieved        PT Long Term Goals - 11/06/17 0859      PT LONG TERM GOAL #1   Title  "Pt will be independent with advanced HEP    Baseline  independent with exercises so far    Time  8    Period  Weeks    Status  On-going      PT LONG TERM GOAL #2   Title  "Pt will be able to negotiate steps without exacerbation of pain greater than 2/10 or less    Baseline  Pt never has  better than 4/10 with any movment of pain due to nerve pain    Time  8    Period  Weeks    Status  On-going      PT LONG TERM GOAL #3   Title  "R ankle AROM flexion will improve to to DF of 10 degrees for improved  mobility and to walk with more normalized gait    Baseline  Right ankle 9 degrees with stiffness in ankle and pain 4/10 at minimum/ decreased stride length on right    Time  8    Period  Weeks    Status  On-going      PT LONG TERM GOAL #4   Title  Pt will be educated on pain management strategies in order to stand for 1 hour at home /or fishing for leasure activity    Baseline  Able to stand 1 hour with pain at 4/10 verbally reported    Time  8    Period  Weeks    Status  Unable to assess      PT LONG TERM GOAL #5   Title  Pt will be able to walk on unlevel grround with pain levele 3/10 or less in order to pursue fishing tasks on incline    Baseline  4 to 7-10  pain normally , never less than 4/10    Time  8    Period  Weeks    Status  On-going      PT LONG TERM GOAL #6   Title  I would like to stand for an hour in order to mow the lawn at home    Baseline  lasted 1/2 hour mowing grass Pt goes from a 4/10 to a 7/10    Time  8    Period  Weeks    Status  Unable to assess      PT LONG TERM GOAL #7   Title  Pt will be able to exercises on elliptical for 10 minutes without exacerbating pain greater than 3/10 or less    Baseline  4 minutes no foot pain,  knee pain 4-5/10    Time  7    Period  Weeks  Status  On-going            Plan - 11/08/17 0955    Clinical Impression Statement  Pt is doing well physically with foot.  His main concern is the sensory deficit in right foot.  No feeling on bottom of foot and increased hypersensitivity on dorsum of foot.  Pt increases from 4/10 to 7/10 especially as the day progresses.  Pt wishes to pursue pain managment to maximize comfort for daily life.  Pt is doing well and compliant  with exercises and working on independence with HEP.  Pt should be able to accomplish independence by next session and will be discharged . Pt and wife feel comfortable with one more session to reinforce HEP    Rehab Potential  Good    PT Frequency  2x / week     PT Duration  8 weeks    PT Treatment/Interventions  ADLs/Self Care Home Management;Cryotherapy;Electrical Stimulation;Iontophoresis 4mg /ml Dexamethasone;Moist Heat;Ultrasound;Contrast Bath;Balance training;Therapeutic exercise;Therapeutic activities;Functional mobility training;Stair training;Gait training;Neuromuscular re-education;Patient/family education;Manual techniques;Passive range of motion;Dry needling;Taping    PT Next Visit Plan  continue elliptical,  gym equipment and SLS LE strength, DC to HEP assess elliptical tolerance    PT Home Exercise Plan  AROM of right foot and great toe contrast bath, resisted Ankle with yellow tband and towel crunch gastroc strength sitting and standing step ups forward lateral and step down, Hip strengthening, knee strengthening    Consulted and Agree with Plan of Care  Patient;Family member/caregiver    Family Member Consulted  Wife       Patient will benefit from skilled therapeutic intervention in order to improve the following deficits and impairments:  Abnormal gait, Pain, Impaired sensation, Increased muscle spasms, Increased edema, Decreased skin integrity, Decreased strength, Difficulty walking, Decreased range of motion, Decreased mobility  Visit Diagnosis: Pain in right ankle and joints of right foot  Stiffness of right foot, not elsewhere classified  Other abnormalities of gait and mobility  Muscle weakness (generalized)     Problem List There are no active problems to display for this patient.   Garen Lah, PT Certified Exercise Expert for the Aging Adult  11/08/17 11:37 AM Phone: [phone] Fax: [phone]  [facility] [address] [place], [place], [address] Phone: [phone]   Fax:  [phone]  Name: Aaron Stevens MRN: [id number] Date of Birth: 1955/08/17

## 2017-11-08 NOTE — Patient Instructions (Addendum)
           Garen Lah, PT Certified Exercise Expert for the Aging Adult  11/08/17 9:31 AM Phone: [phone] Fax: [phone] [email or web address]

## 2017-11-13 ENCOUNTER — Ambulatory Visit: Payer: Worker's Compensation | Admitting: Physical Therapy

## 2017-11-15 ENCOUNTER — Encounter: Payer: Self-pay | Admitting: Physical Therapy

## 2017-11-20 ENCOUNTER — Encounter: Payer: Self-pay | Admitting: Physical Therapy

## 2017-11-20 ENCOUNTER — Ambulatory Visit: Payer: Worker's Compensation | Admitting: Physical Therapy

## 2017-11-20 DIAGNOSIS — M25571 Pain in right ankle and joints of right foot: Secondary | ICD-10-CM | POA: Diagnosis not present

## 2017-11-20 DIAGNOSIS — M25674 Stiffness of right foot, not elsewhere classified: Secondary | ICD-10-CM

## 2017-11-20 DIAGNOSIS — R2689 Other abnormalities of gait and mobility: Secondary | ICD-10-CM

## 2017-11-20 DIAGNOSIS — M6281 Muscle weakness (generalized): Secondary | ICD-10-CM

## 2017-11-20 NOTE — Therapy (Addendum)
[place], [place], [address] Phone: [phone]   Fax:  [phone]  Physical Therapy Treatment/Discharge Note  Patient Details  Name: Aaron Stevens MRN: [id number] Date of Birth: Jan 24, 1956 Referring Provider (PT): Gaynelle Arabian MD   Encounter Date: 11/20/2017  PT End of Session - 11/20/17 1631    Visit Number  20    Number of Visits  24    Date for PT Re-Evaluation  11/20/17    Authorization Type  self pay re authorized for 12 additional visit plus original 12    Authorization Time Period  Target 11-27-17    Authorization - Visit Number  20    Authorization - Number of Visits  24    PT Start Time  2202    PT Stop Time  0930    PT Time Calculation (min)  43 min    Activity Tolerance  Patient tolerated treatment well    Behavior During Therapy  [facility] for tasks assessed/performed       Past Medical History:  Diagnosis Date  . Atherosclerosis of artery   . Emphysema of lung ([place])   . Pneumonia     Past Surgical History:  Procedure Laterality Date  . APPENDECTOMY    . KNEE SURGERY      There were no vitals filed for this visit.  Subjective Assessment - 11/20/17 0853    Subjective  I feel like the  bottom of my foot will never feel better.  It is numb and feels like a million needles are going through it.      Patient is accompained by:  Family member   wife   Pertinent History  Emphysema, left knee surgery > 20 years ago    Limitations  Sitting;Standing;House hold activities    How long can you sit comfortably?  unlimited    How long can you stand comfortably?  about an 1hour    How long can you walk comfortably?  about an 1 hour but then he needs to find a place to sit down    Diagnostic tests  x ray    Patient Stated Goals  I would like my foot better so I can walk normally and stand up longer than 30 miinutes to do stuff around house and to be able to get back to fishing and walk  on uneven grounds, mow the yard    Currently in Pain?  Yes    Pain Score  5     Pain Location  Foot    Pain Orientation  Right   bottom of foot   Pain Descriptors / Indicators  Pins and needles;Numbness    Pain Type  Chronic pain;Neuropathic pain    Pain Onset  More than a month ago    Pain Frequency  Constant   constant on bottom of foot        [facility] PT Assessment - 11/20/17 0001      Assessment   Medical Diagnosis  R Metatarsal closed fx     Referring Provider (PT)  Gaynelle Arabian MD      Sensation   Light Touch  Impaired by gross assessment   pt with pins and needles and numbness   Hot/Cold  Impaired by gross assessment   cannot feel hot water before it gets past right ankle   Proprioception  Impaired by gross assessment    Additional Comments  Pt states his right foot feels like he is walkign  on water  It feels  " mushy"      Functional Tests   Functional tests  Single leg stance;Lunges      Single Leg Stance   Comments  Pt 25 in right shoe but barefoot unable to balance for longer than  5 seconds without holding onto chair for balance      Sit to Stand   Comments  5 x sts 13.6 seconds      AROM   Overall AROM Comments  right great toe 68ext, 60 degrees flex on right  60 ext on left     Right Knee Extension  0    Right Knee Flexion  144    Left Knee Extension  0    Left Knee Flexion  147    Right Ankle Dorsiflexion  10   after exercise   Right Ankle Plantar Flexion  45    Right Ankle Inversion  30    Right Ankle Eversion  22    Left Ankle Dorsiflexion  12    Left Ankle Plantar Flexion  55    Left Ankle Inversion  33    Left Ankle Eversion  30      Strength   Overall Strength  Deficits    Right Hip Flexion  5/5    Right Hip Extension  5/5    Right Hip ABduction  4+/5    Left Hip Flexion  5/5    Left Hip Extension  5/5    Left Hip ABduction  4+/5    Right Knee Flexion  5/5    Right Knee Extension  5/5    Left Knee Flexion  5/5    Left Knee Extension   5/5    Right Ankle Dorsiflexion  4/5    Right Ankle Plantar Flexion  4+/5    Right Ankle Inversion  4/5    Right Ankle Eversion  4/5    Left Ankle Dorsiflexion  5/5    Left Ankle Plantar Flexion  5/5    Left Ankle Inversion  5/5    Left Ankle Eversion  5/5      Palpation   Palpation comment  tenderness over right great toe sesamoid bones and tenderness over ball of foot of volar surface. Most pain over first ray with end range bending now                   [facility] Adult PT Treatment/Exercise - 11/20/17 0855      Ambulation/Gait   Gait Comments  Pt now walking with normalized arm swing  but can only tolerate 4 minutes on the elliptical due to pain      Self-Care   Self-Care  Other Self-Care Comments    Other Self-Care Comments   reviewed HEP and discussed pain science education regarding foot lpain      Knee/Hip Exercises: Standing   Hip Flexion  3 sets   30 seconds each with 2 squats between   Functional Squat  10 reps    Wall Squat  10 reps   with ball squeeze for hip position   Wall Squat Limitations  cued for equal weight shift.    Stairs  6 inch step to with pain    Other Standing Knee Exercises  squat X 8 holding counter      Knee/Hip Exercises: Supine   Bridges  2 sets;Strengthening;Both    Single Leg Bridge  2 sets;Strengthening;Right;20 reps      Knee/Hip Exercises: Sidelying  Hip ABduction  Right;1 set;15 reps   VC tc for correct alignment     Cryotherapy   Cryotherapy Location  Knee   left concurrent with supine exercises right foot     Manual Therapy   Manual Therapy  --    Joint Mobilization  --      Ankle Exercises: Aerobic   Elliptical  4 minutes with no pain and then begins 4/10 to 7/10       Ankle Exercises: Stretches   Gastroc Stretch Limitations  3 X 30 seconds on rocker board ( foot flat tolerated better.       Ankle Exercises: Standing   SLS  4 X 15 on pad with shoe  increased pain to 6/10 dorsum foot     Heel Raises  20 reps     Other Standing Ankle Exercises  ther ex 15 sec x 5 , pt with visible co contraction and upper trunk compensations   uses UE support   Other Standing Ankle Exercises  lunge x 10 right and left with UE support      Ankle Exercises: Supine   Other Supine Ankle Exercises  --    Other Supine Ankle Exercises  --      Ankle Exercises: Seated   Other Seated Ankle Exercises  great toe flexion with green t band cx 20 resistance      Ankle Exercises: Machines for Strengthening   Cybex Leg Press  120 lb  sets of 10 with rest,  x 2             PT Education - 11/20/17 0901    Education Details  added single limb sit to stand to HEP to continue strengthening       PT Short Term Goals - 08/28/17 0904      PT SHORT TERM GOAL #1   Title  Pt will be independent with intial HEP    Time  2    Period  Weeks    Status  Achieved      PT SHORT TERM GOAL #2   Title  Pt will be educated on proper gait using assitive device    Baseline  cane use on level and steps    Time  2    Period  Weeks    Status  Achieved        PT Long Term Goals - 11/20/17 0857      PT LONG TERM GOAL #1   Title  "Pt will be independent with advanced HEP    Baseline  able to demonstrate. favorite exercise single limb stance for strength     Time  8    Period  Weeks    Status  Achieved      PT LONG TERM GOAL #2   Title  "Pt will be able to negotiate steps without exacerbation of pain greater than 2/10 or less    Baseline  Pt is able negotiate steps safely but pain can fluctuate from 4/10 to 7/10 with pins and needles and numbness and sometimes sharp pain    Time  8    Period  Weeks    Status  Partially Met      PT LONG TERM GOAL #3   Title  "R ankle AROM flexion will improve to to DF of 10 degrees for improved mobility and to walk with more normalized gait    Baseline  Right ankle 10 degrees with stiffness in ankle and pain 4/10 at  minimum    Time  8    Period  Weeks    Status  Achieved      PT LONG TERM  GOAL #4   Title  Pt will be educated on pain management strategies in order to stand for 1 hour at home /or fishing for leasure activity    Baseline  Able to stand 1 hour with pain at 4/10 verbally reported at minimun  but must sit down after an hours    Time  8    Period  Weeks    Status  Achieved      PT LONG TERM GOAL #5   Title  Pt will be able to walk on unlevel grround with pain levele 3/10 or less in order to pursue fishing tasks on incline    Baseline  4 to 7-10  pain normally , never less than 4/10    Time  8    Period  Weeks    Status  Partially Met      PT LONG TERM GOAL #6   Title  I would like to stand for an hour in order to mow the lawn at home    Baseline  can only last 30 to 45 minutes mowing lawn at pain level 4/10 to 7/10    Time  8    Period  Weeks    Status  Partially Met      PT LONG TERM GOAL #7   Title  Pt will be able to exercises on elliptical for 10 minutes without exacerbating pain greater than 3/10 or less    Baseline  Pt can only last 2-4 minutes on elliptical with pain minimall 4/0 to 7/10    Time  7    Period  Weeks    Status  Not Met      PT LONG TERM GOAL #8   Title  Pt will be able to stand on ther ex pad for > 15 seconds  barefoot to simulate walking on uneven ground with safety    Baseline  Pt is able to stand on ther Ex pad for 20 to 25 seconds with shoes on but cannot stand barefoot longer than 5 seconds    Time  8    Period  Weeks    Status  Partially Met      PT LONG TERM GOAL  #9   TITLE  "Pt will improve R ankle/ knee/hip extensor/flexor strength to >/= 4+/5 with </= 3/10 pain to promote safety with walking/standing activities    Baseline  Pt has 4/5 to 5/5 in right hip and anle is PF 4=/5, and DF, IV and EV 4/5 improved from evaluation but still has at minimum pain 4/10 to 7/10 with functional activities    Time  8    Period  Weeks    Status  Partially Met            Plan - 11/20/17 0928    Clinical Impression Statement  Pt  has much improved physical function with right foot but is mainly limited by pain of numbness and tingling and sensory loss in right foot. Pain is over right great toe plantar surface over sesamiod bones and Mr. yielding is limited to standing and walking up to an hour . His sensory depreivation in right foot prevents him from standing on one leg effectively but he does perform his exercise at home with UE support.  Pt does try to mow lawn for 30  to 45 minutes but must sit down soon after due to pain.  Pt is unable to exercise on an elliptical longer than 4-5 minutes normally although today he could not tolerate longer than 2 minutes.  He has made tremendous gains physcially and can ambulate without an assistive device.  He is most disturbed by his sensory loss in his foot from the injury in February 2019 . All LTG's achieved/partially met except for # 7 on elliptial. Mr Wuertz will continue further care concerning pain management of his right foot and is aggreeable to discharge today due to achieved goals and function.      Rehab Potential  Good    PT Frequency  2x / week    PT Duration  8 weeks    PT Treatment/Interventions  ADLs/Self Care Home Management;Cryotherapy;Electrical Stimulation;Iontophoresis '4mg'$ /ml Dexamethasone;Moist Heat;Ultrasound;Contrast Bath;Balance training;Therapeutic exercise;Therapeutic activities;Functional mobility training;Stair training;Gait training;Neuromuscular re-education;Patient/family education;Manual techniques;Passive range of motion;Dry needling;Taping    PT Next Visit Plan  DC    PT Home Exercise Plan  AROM of right foot and great toe contrast bath, resisted Ankle with yellow tband and towel crunch gastroc strength sitting and standing step ups forward lateral and step down, Hip strengthening, knee strengthening    Consulted and Agree with Plan of Care  Patient;Family member/caregiver   wife      Patient will benefit from skilled therapeutic intervention in order  to improve the following deficits and impairments:     Visit Diagnosis: Pain in right ankle and joints of right foot  Stiffness of right foot, not elsewhere classified  Other abnormalities of gait and mobility  Muscle weakness (generalized)     Problem List There are no active problems to display for this patient.   Voncille Lo, PT Certified Exercise Expert for the Aging Adult  11/20/17 4:33 PM Phone: [phone] Fax: [phone]  [facility] [address] [place], [place], [address] Phone: [phone]   Fax:  [phone]  Name: Jahrell Hamor MRN: [id number] Date of Birth: [birth year]

## 2017-11-20 NOTE — Patient Instructions (Signed)
   Garen Lah, PT Certified Exercise Expert for the Aging Adult  11/20/17 9:01 AM Phone: [phone] Fax: [phone]

## 2017-11-22 ENCOUNTER — Encounter: Payer: Self-pay | Admitting: Physical Therapy

## 2017-11-27 ENCOUNTER — Encounter: Payer: Self-pay | Admitting: Physical Therapy

## 2017-11-29 ENCOUNTER — Encounter: Payer: Self-pay | Admitting: Physical Therapy

## 2018-01-09 ENCOUNTER — Emergency Department (HOSPITAL_COMMUNITY)
Admission: EM | Admit: 2018-01-09 | Discharge: 2018-01-09 | Disposition: A | Discharge status: Home / Self Care | Payer: Worker's Compensation | Attending: Emergency Medicine | Admitting: Emergency Medicine

## 2018-01-09 ENCOUNTER — Encounter (HOSPITAL_COMMUNITY): Payer: Self-pay | Admitting: Emergency Medicine

## 2018-01-09 ENCOUNTER — Emergency Department (HOSPITAL_COMMUNITY): Payer: Worker's Compensation

## 2018-01-09 ENCOUNTER — Other Ambulatory Visit: Payer: Self-pay

## 2018-01-09 DIAGNOSIS — M79671 Pain in right foot: Secondary | ICD-10-CM | POA: Diagnosis present

## 2018-01-09 DIAGNOSIS — M79674 Pain in right toe(s): Secondary | ICD-10-CM | POA: Diagnosis not present

## 2018-01-09 DIAGNOSIS — F17210 Nicotine dependence, cigarettes, uncomplicated: Secondary | ICD-10-CM | POA: Insufficient documentation

## 2018-01-09 NOTE — ED Triage Notes (Signed)
Patient complains of R foot pain. Patient ambulated back to Triage w/ limping. Hx of R foot injury, Feb. 25th 2019 lumbar fell on foot, rehab/PT for foot x3 months. Reports constant numbness and pain in foot. Takes Tramadol and Gabapentin q day. Took w/o relief today.  Patient's 3rd toe on R foot appears discolored and swollen, cap refill within 3 seconds, +2 pedal pulses.

## 2018-01-09 NOTE — ED Provider Notes (Signed)
[facility]-EMERGENCY DEPT Provider Note   CSN: [id number] Arrival date & time: 01/09/18  1938     History   Chief Complaint Chief Complaint  Patient presents with  . Foot Pain    HPI Aaron Stevens is a 62 y.o. male.  The history is provided by the patient. No language interpreter was used.  Foot Pain  This is a new problem. The problem occurs constantly. Nothing aggravates the symptoms. Nothing relieves the symptoms. He has tried nothing for the symptoms. The treatment provided no relief.  Pt fractured his right foot in February.  Pt has been seeing Physical therapy/Rehab.  Pt reports he noticed 3rd toe is discolored.  Pt is seeing pain mangement/rehab and has been diagnosed with RSD.  Past Medical History:  Diagnosis Date  . Atherosclerosis of artery   . Emphysema of lung (HCC)   . Pneumonia     There are no active problems to display for this patient.   Past Surgical History:  Procedure Laterality Date  . APPENDECTOMY    . KNEE SURGERY          Home Medications    Prior to Admission medications   Medication Sig Start Date End Date Taking? Authorizing Provider  acetaminophen (TYLENOL) 500 MG tablet Take 1,000 mg by mouth every 6 (six) hours as needed. Pain   Yes [provider]  albuterol (PROVENTIL HFA;VENTOLIN HFA) 108 (90 Base) MCG/ACT inhaler Inhale 1-2 puffs into the lungs every 6 (six) hours as needed for wheezing or shortness of breath. Patient not taking: Reported on 08/21/2017 07/11/16   Coralyn MarkMitchell, Melanie L, NP  albuterol (PROVENTIL HFA;VENTOLIN HFA) 108 (90 Base) MCG/ACT inhaler Inhale 2 puffs into the lungs every 4 (four) hours as needed for wheezing or shortness of breath. Patient not taking: Reported on 08/21/2017 09/28/16   Arthor CaptainHarris, Abigail, PA-C  amoxicillin (AMOXIL) 500 MG capsule Take 2 capsules (1,000 mg total) by mouth 3 (three) times daily. Patient not taking: Reported on 08/21/2017 07/11/16   Coralyn MarkMitchell, Melanie  L, NP  azithromycin (ZITHROMAX Z-PAK) 250 MG tablet 2 po day one, then 1 daily x 4 days Patient not taking: Reported on 08/21/2017 09/28/16   Arthor CaptainHarris, Abigail, PA-C  azithromycin (ZITHROMAX) 250 MG tablet Take 1 tablet (250 mg total) by mouth daily. Take first 2 tablets together, then 1 every day until finished. Patient not taking: Reported on 08/21/2017 07/11/16   Coralyn MarkMitchell, Melanie L, NP  HYDROcodone-acetaminophen (NORCO) 5-325 MG per tablet Take 1 tablet by mouth every 4 (four) hours as needed. Patient not taking: Reported on 08/21/2017 03/05/14   Loren RacerYelverton, David, MD  methylPREDNISolone (MEDROL DOSEPAK) 4 MG TBPK tablet Use as directed Patient not taking: Reported on 01/09/2018 09/28/16   Arthor CaptainHarris, Abigail, PA-C  naproxen (NAPROSYN) 500 MG tablet Take 1 tablet (500 mg total) by mouth 2 (two) times daily. Patient not taking: Reported on 08/21/2017 01/31/15   Cheri Fowlerose, Kayla, PA-C  oxyCODONE-acetaminophen (PERCOCET/ROXICET) 5-325 MG tablet Take 1 tablet by mouth every 6 (six) hours as needed for severe pain. Patient not taking: Reported on 08/21/2017 03/26/17   Couture, Cortni S, PA-C  predniSONE (STERAPRED UNI-PAK 21 TAB) 10 MG (21) TBPK tablet Take by mouth daily. Take 6 tabs by mouth daily  for 2 days, then 5 tabs for 2 days, then 4 tabs for 2 days, then 3 tabs for 2 days, 2 tabs for 2 days, then 1 tab by mouth daily for 2 days Patient not taking: Reported on 08/21/2017 07/11/16  Maple Mirza L, NP  senna-docusate (SENOKOT-S) 8.6-50 MG tablet Take 1 tablet by mouth daily. Patient not taking: Reported on 08/21/2017 03/26/17   Couture, Cortni S, PA-C  traMADol (ULTRAM) 50 MG tablet Take 1 tablet (50 mg total) by mouth every 6 (six) hours as needed. Patient not taking: Reported on 01/09/2018 04/26/15   Eyvonne Mechanic, PA-C    Family History History reviewed. No pertinent family history.  Social History Social History   Tobacco Use  . Smoking status: Current Every Day Smoker    Packs/day: 0.50    Types:  Cigarettes  . Smokeless tobacco: Never Used  Substance Use Topics  . Alcohol use: No  . Drug use: No     Allergies   Sulfa antibiotics and Bee venom   Review of Systems Review of Systems  All other systems reviewed and are negative.    Physical Exam Updated Vital Signs BP 139/76 (BP Location: Left Arm)   Pulse 83   Temp 98.5 F (36.9 C) (Oral)   Resp 12   Ht 5\' 6"  (1.676 m)   Wt 63.5 kg   SpO2 96%   BMI 22.60 kg/m   Physical Exam  Constitutional: He appears well-developed and well-nourished.  Musculoskeletal: He exhibits tenderness.  Purple distal 3rd toe, good pulse   Neurological: He is alert.  Skin: Skin is warm.  Psychiatric: He has a normal mood and affect.  Nursing note and vitals reviewed.    ED Treatments / Results  Labs (all labs ordered are listed, but only abnormal results are displayed) Labs Reviewed - No data to display  EKG None  Radiology Dg Foot Complete Right  Result Date: 01/09/2018 CLINICAL DATA:  Right foot pain. Previous right 1st metatarsal fracture in February 2019. No recent injury. EXAM: RIGHT FOOT COMPLETE - 3+ VIEW COMPARISON:  03/26/2017. FINDINGS: Interval healing of the previously demonstrated 1st metatarsal fracture with normal alignment and interval shortening of the metatarsal. No acute fractures are seen. IMPRESSION: 1. Interval healing of the previously demonstrated 1st metatarsal fracture with interval shortening of the metatarsal. 2. No acute abnormality. Electronically Signed   By: Beckie Salts M.D.   On: 01/09/2018 21:14    Procedures Procedures (including critical care time)  Medications Ordered in ED Medications - No data to display   Initial Impression / Assessment and Plan / ED Course  I have reviewed the triage vital signs and the nursing notes.  Pertinent labs & imaging results that were available during my care of the patient were reviewed by me and considered in my medical decision making (see chart for  details).    MDM  Dr. Penne Lash in to see and examine.  He notes good pulse, normal cap refill,  Possible second to RSD  Pt advised to follow up with Dr. Despina Hick for evalaution  Final Clinical Impressions(s) / ED Diagnoses   Final diagnoses:  Pain in right toe(s)    ED Discharge Orders    None       Osie Cheeks 01/09/18 2232    Shaune Pollack, MD 01/10/18 [phone]

## 2018-01-09 NOTE — Discharge Instructions (Signed)
Elevate foot, Schedule to see Dr. Despina HickAlusio for recheck.  Return if any problems.

## 2018-03-04 ENCOUNTER — Other Ambulatory Visit: Payer: Self-pay

## 2018-03-04 ENCOUNTER — Emergency Department (HOSPITAL_COMMUNITY)
Admission: EM | Admit: 2018-03-04 | Discharge: 2018-03-04 | Disposition: A | Discharge status: Home / Self Care | Payer: Worker's Compensation | Attending: Emergency Medicine | Admitting: Emergency Medicine

## 2018-03-04 ENCOUNTER — Encounter (HOSPITAL_COMMUNITY): Payer: Self-pay | Admitting: Emergency Medicine

## 2018-03-04 DIAGNOSIS — Z79899 Other long term (current) drug therapy: Secondary | ICD-10-CM | POA: Insufficient documentation

## 2018-03-04 DIAGNOSIS — F17210 Nicotine dependence, cigarettes, uncomplicated: Secondary | ICD-10-CM | POA: Diagnosis not present

## 2018-03-04 DIAGNOSIS — M79671 Pain in right foot: Secondary | ICD-10-CM | POA: Insufficient documentation

## 2018-03-04 MED ORDER — GABAPENTIN 300 MG PO CAPS: 300.0000 mg | ORAL_CAPSULE | Freq: Three times a day (TID) | ORAL | 0 refills | Status: DC

## 2018-03-04 NOTE — ED Provider Notes (Signed)
[facility]-EMERGENCY DEPT Provider Note   CSN: [id number] Arrival date & time: 03/04/18  [id number]     History   Chief Complaint Chief Complaint  Patient presents with  . Foot Pain    HPI Aaron Stevens is a 63 y.o. male.  HPI Patient presents with his wife who assists with the HPI. Patient notes that he had a crush injury about 1 year ago, with lumbar falling onto his foot. Since that time the patient has had episodes of pain, dysesthesia in the foot, primarily the second and third digits. Over the past 3 days patient has had a more severe episode than usual, with loss of sensation in both toes, discoloration, but pain with motion. No other systemic complaints. Patient takes his tramadol as directed, no longer takes gabapentin due to discomfort. No clear precipitant for this episode, no clear alleviating or exacerbating factors. Past Medical History:  Diagnosis Date  . Atherosclerosis of artery   . Emphysema of lung (HCC)   . Pneumonia     There are no active problems to display for this patient.   Past Surgical History:  Procedure Laterality Date  . APPENDECTOMY    . KNEE SURGERY          Home Medications    Prior to Admission medications   Medication Sig Start Date End Date Taking? Authorizing Provider  acetaminophen (TYLENOL) 500 MG tablet Take 1,000 mg by mouth every 6 (six) hours as needed. Pain    [provider]  gabapentin (NEURONTIN) 300 MG capsule Take 1 capsule (300 mg total) by mouth 3 (three) times daily. 03/04/18   Gerhard MunchLockwood, Jolayne Branson, MD    Family History No family history on file.  Social History Social History   Tobacco Use  . Smoking status: Current Every Day Smoker    Packs/day: 0.50    Types: Cigarettes  . Smokeless tobacco: Never Used  Substance Use Topics  . Alcohol use: No  . Drug use: No     Allergies   Sulfa antibiotics and Bee venom   Review of Systems Review of Systems  Constitutional:       Per HPI, otherwise negative  HENT:       Per HPI, otherwise negative  Respiratory:       Per HPI, otherwise negative  Cardiovascular:       Per HPI, otherwise negative  Gastrointestinal: Negative for vomiting.  Endocrine:       Negative aside from HPI  Genitourinary:       Neg aside from HPI   Musculoskeletal:       Per HPI, otherwise negative  Skin: Positive for color change.  Neurological: Positive for numbness. Negative for syncope.     Physical Exam Updated Vital Signs BP 117/67 (BP Location: Left Arm)   Pulse 80   Temp 97.6 F (36.4 C) (Oral)   Resp 17   Ht 5\' 6"  (1.676 m)   Wt 65.8 kg   SpO2 100%   BMI 23.40 kg/m   Physical Exam Vitals signs and nursing note reviewed.  Constitutional:      General: He is not in acute distress.    Appearance: He is well-developed.     Comments: Chronically ill appearing thin adult male awake and alert  HENT:     Head: Normocephalic and atraumatic.  Eyes:     Conjunctiva/sclera: Conjunctivae normal.  Cardiovascular:     Rate and Rhythm: Normal rate and regular rhythm.  Pulses: Normal pulses.  Pulmonary:     Effort: Pulmonary effort is normal. No respiratory distress.     Breath sounds: No stridor.  Abdominal:     General: There is no distension.  Musculoskeletal:       Feet:  Skin:    General: Skin is warm and dry.  Neurological:     Mental Status: He is alert and oriented to person, place, and time.     Comments: Reflex, capacity to move the toes all within normal limits      ED Treatments / Results   Procedures Procedures (including critical care time)  Medications Ordered in ED Medications - No data to display   Initial Impression / Assessment and Plan / ED Course  I have reviewed the triage vital signs and the nursing notes.  Pertinent labs & imaging results that were available during my care of the patient were reviewed by me and considered in my medical decision making (see chart for  details).  This is a chronic smoker presents with ongoing discomfort about his second and third toes on the right.  Injury occurred about 1 year ago, has had multiple prior similar episodes. Today the patient has preserved cap refill, appropriate pulses, no warmth, no fever, no evidence for infection or arterial occlusion. No other evidence for venous occlusion, with no calf swelling. With exam consistent with sequelae of crush injury, no evidence for acute new pathology we discussed options for additional therapy including acupuncture, acupressure, and need for resumption of his gabapentin, as well as close outpatient follow-up.    Final Clinical Impressions(s) / ED Diagnoses   Final diagnoses:  Foot pain, right    ED Discharge Orders         Ordered    gabapentin (NEURONTIN) 300 MG capsule  3 times daily     03/04/18 1117           Gerhard Munch, MD 03/04/18 1120

## 2018-03-04 NOTE — Discharge Instructions (Signed)
As discussed, your evaluation today has been largely reassuring.  But, it is important that you monitor your condition carefully, and do not hesitate to return to the ED if you develop new, or concerning changes in your condition.  Otherwise, please follow-up with your physician for appropriate ongoing care.  In addition to seeing your physician, you may consider additional therapy including acupuncture, acupressure for relief.

## 2018-03-04 NOTE — ED Triage Notes (Signed)
Pt reports that had a load of lumber to fall on right foot last year. Reports for couple days had tingling, numbness and the two middle toes are blue.

## 2018-06-11 ENCOUNTER — Other Ambulatory Visit: Payer: Self-pay

## 2018-06-11 ENCOUNTER — Emergency Department (HOSPITAL_COMMUNITY): Payer: Worker's Compensation

## 2018-06-11 ENCOUNTER — Emergency Department (HOSPITAL_COMMUNITY)
Admission: EM | Admit: 2018-06-11 | Discharge: 2018-06-12 | Disposition: A | Discharge status: Home / Self Care | Payer: Worker's Compensation | Attending: Emergency Medicine | Admitting: Emergency Medicine

## 2018-06-11 ENCOUNTER — Encounter (HOSPITAL_COMMUNITY): Payer: Self-pay

## 2018-06-11 DIAGNOSIS — G90529 Complex regional pain syndrome I of unspecified lower limb: Secondary | ICD-10-CM | POA: Insufficient documentation

## 2018-06-11 DIAGNOSIS — Z1159 Encounter for screening for other viral diseases: Secondary | ICD-10-CM | POA: Diagnosis not present

## 2018-06-11 DIAGNOSIS — F17210 Nicotine dependence, cigarettes, uncomplicated: Secondary | ICD-10-CM | POA: Diagnosis not present

## 2018-06-11 DIAGNOSIS — T50901A Poisoning by unspecified drugs, medicaments and biological substances, accidental (unintentional), initial encounter: Secondary | ICD-10-CM | POA: Insufficient documentation

## 2018-06-11 DIAGNOSIS — R4182 Altered mental status, unspecified: Secondary | ICD-10-CM | POA: Diagnosis not present

## 2018-06-11 DIAGNOSIS — Z79899 Other long term (current) drug therapy: Secondary | ICD-10-CM | POA: Diagnosis not present

## 2018-06-11 LAB — RAPID URINE DRUG SCREEN, HOSP PERFORMED
Amphetamines: NOT DETECTED
Barbiturates: NOT DETECTED
Benzodiazepines: POSITIVE — AB
Cocaine: NOT DETECTED
Opiates: NOT DETECTED
Tetrahydrocannabinol: NOT DETECTED

## 2018-06-11 LAB — CBC WITH DIFFERENTIAL/PLATELET
Abs Immature Granulocytes: 0.02 10*3/uL (ref 0.00–0.07)
Basophils Absolute: 0.1 10*3/uL (ref 0.0–0.1)
Basophils Relative: 1 %
Eosinophils Absolute: 0.2 10*3/uL (ref 0.0–0.5)
Eosinophils Relative: 2 %
HCT: 46 % (ref 39.0–52.0)
Hemoglobin: 15.2 g/dL (ref 13.0–17.0)
Immature Granulocytes: 0 %
Lymphocytes Relative: 36 %
Lymphs Abs: 4 10*3/uL (ref 0.7–4.0)
MCH: 31.1 pg (ref 26.0–34.0)
MCHC: 33 g/dL (ref 30.0–36.0)
MCV: 94.1 fL (ref 80.0–100.0)
Monocytes Absolute: 0.8 10*3/uL (ref 0.1–1.0)
Monocytes Relative: 7 %
Neutro Abs: 5.9 10*3/uL (ref 1.7–7.7)
Neutrophils Relative %: 54 %
Platelets: 294 10*3/uL (ref 150–400)
RBC: 4.89 MIL/uL (ref 4.22–5.81)
RDW: 13.6 % (ref 11.5–15.5)
WBC: 11 10*3/uL — ABNORMAL HIGH (ref 4.0–10.5)
nRBC: 0 % (ref 0.0–0.2)

## 2018-06-11 LAB — URINALYSIS, ROUTINE W REFLEX MICROSCOPIC
Bilirubin Urine: NEGATIVE
Glucose, UA: NEGATIVE mg/dL
Hgb urine dipstick: NEGATIVE
Ketones, ur: NEGATIVE mg/dL
Leukocytes,Ua: NEGATIVE
Nitrite: NEGATIVE
Protein, ur: NEGATIVE mg/dL
Specific Gravity, Urine: 1.012 (ref 1.005–1.030)
pH: 7 (ref 5.0–8.0)

## 2018-06-11 LAB — COMPREHENSIVE METABOLIC PANEL
ALT: 14 U/L (ref 0–44)
AST: 15 U/L (ref 15–41)
Albumin: 3.6 g/dL (ref 3.5–5.0)
Alkaline Phosphatase: 95 U/L (ref 38–126)
Anion gap: 7 (ref 5–15)
BUN: 12 mg/dL (ref 8–23)
CO2: 28 mmol/L (ref 22–32)
Calcium: 9 mg/dL (ref 8.9–10.3)
Chloride: 107 mmol/L (ref 98–111)
Creatinine, Ser: 0.93 mg/dL (ref 0.61–1.24)
GFR calc Af Amer: >60 mL/min (ref >60)
GFR calc non Af Amer: >60 mL/min (ref >60)
Glucose, Bld: 83 mg/dL (ref 70–99)
Potassium: 4.5 mmol/L (ref 3.5–5.1)
Sodium: 142 mmol/L (ref 135–145)
Total Bilirubin: 0.7 mg/dL (ref 0.3–1.2)
Total Protein: 6.6 g/dL (ref 6.5–8.1)

## 2018-06-11 LAB — BLOOD GAS, VENOUS
Acid-Base Excess: 3.9 mmol/L — ABNORMAL HIGH (ref 0.0–2.0)
Bicarbonate: 30.3 mmol/L — ABNORMAL HIGH (ref 20.0–28.0)
FIO2: 21
O2 Saturation: 46.1 %
Patient temperature: 97.8
pCO2, Ven: 53.1 mmHg (ref 44.0–60.0)
pH, Ven: 7.372 (ref 7.250–7.430)

## 2018-06-11 LAB — ETHANOL: Alcohol, Ethyl (B): <10 mg/dL (ref <10)

## 2018-06-11 LAB — SARS CORONAVIRUS 2 BY RT PCR (HOSPITAL ORDER, PERFORMED IN ~~LOC~~ HOSPITAL LAB): SARS Coronavirus 2: NEGATIVE

## 2018-06-11 NOTE — ED Provider Notes (Signed)
Patient sobering up after receiving large doses of pain medication at pain clinic today. Workup reassuring, but patient still sobering up in the ED.  12:44 AM No longer confused.  Just mad about having been in the ER all day and wanting to go home.  Discussed discharge with wife, she is agreeable with taking the patient home.  Return precautions discussed.   Roxy Horseman, PA-C 06/12/18 0045    Little, Ambrose Finland, MD 06/13/18 1710

## 2018-06-11 NOTE — ED Triage Notes (Signed)
Pt wife reports that pt was on a ketamine drip from 830a-130p. She states that she thought that he should be back to normal by now. Pt is lethargic, but answers questions appropriately. VSS. Denies pain or unilateral weakness. Equal grips and no facial droop noted.

## 2018-06-11 NOTE — ED Notes (Addendum)
Pt attempting to get out of bed saying he is leaving. This nurse assisted patient back into bed, pt yelling about how much longer he will be here. Family at beside.

## 2018-06-11 NOTE — ED Notes (Signed)
Pt was placed on the monitor and provided a urinal for a urine sample.

## 2018-06-11 NOTE — ED Provider Notes (Addendum)
[facility]-EMERGENCY DEPT Provider Note   CSN: [id number] Arrival date & time: 06/11/18  1904    History   Chief Complaint Chief Complaint  Patient presents with  . Altered Mental Status    HPI Aaron Stevens is a 63 y.o. male.     The history is provided by the patient. No language interpreter was used.  Altered Mental Status  Presenting symptoms: confusion, disorientation, lethargy and partial responsiveness   Severity:  Severe Most recent episode:  Today Episode history:  Single Duration:  6 hours Timing:  Constant Progression:  Worsening Chronicity:  New Context: recent change in medication   Pt had a crush injury to his foot and has complex regional pain syndrome.  Pt received a ketamine infusion today at the pain management clinic in [place].  Pt's wife reports pt has been incontinent and confused.  Pt is normally alert.  No confusion no dementia.  Pt was on IV ketamine infusion from 8;30 until 1:30.  250 mg total.  Pt was also given ativan 4mg  and phenergan 25mg    Past Medical History:  Diagnosis Date  . Atherosclerosis of artery   . Emphysema of lung (HCC)   . Pneumonia     There are no active problems to display for this patient.   Past Surgical History:  Procedure Laterality Date  . APPENDECTOMY    . KNEE SURGERY          Home Medications    Prior to Admission medications   Medication Sig Start Date End Date Taking? Authorizing Provider  acetaminophen (TYLENOL) 500 MG tablet Take 1,000 mg by mouth every 6 (six) hours as needed. Pain    [provider]  gabapentin (NEURONTIN) 300 MG capsule Take 1 capsule (300 mg total) by mouth 3 (three) times daily. 03/04/18   Gerhard Munch, MD    Family History History reviewed. No pertinent family history.  Social History Social History   Tobacco Use  . Smoking status: Current Every Day Smoker    Packs/day: 0.50    Types: Cigarettes  . Smokeless tobacco: Never  Used  Substance Use Topics  . Alcohol use: No  . Drug use: No     Allergies   Sulfa antibiotics and Bee venom   Review of Systems Review of Systems  Respiratory: Positive for cough.   Psychiatric/Behavioral: Positive for confusion and decreased concentration.  All other systems reviewed and are negative.    Physical Exam Updated Vital Signs BP 122/69 (BP Location: Left Arm)   Pulse 97   Temp 97.8 F (36.6 C) (Oral)   Resp 14   SpO2 99%   Physical Exam Vitals signs and nursing note reviewed.  Constitutional:      Appearance: He is well-developed.  HENT:     Head: Normocephalic and atraumatic.     Right Ear: Tympanic membrane normal.     Left Ear: Tympanic membrane normal.     Nose: Nose normal.     Mouth/Throat:     Mouth: Mucous membranes are moist.  Eyes:     Conjunctiva/sclera: Conjunctivae normal.  Neck:     Musculoskeletal: Normal range of motion and neck supple.  Cardiovascular:     Rate and Rhythm: Normal rate and regular rhythm.     Heart sounds: No murmur.  Pulmonary:     Effort: Pulmonary effort is normal. No respiratory distress.     Breath sounds: Normal breath sounds.  Abdominal:     General: Abdomen is  flat.     Palpations: Abdomen is soft.     Tenderness: There is no abdominal tenderness.  Skin:    General: Skin is warm and dry.  Neurological:     General: No focal deficit present.     Mental Status: He is alert. He is disoriented.     Comments: Confused, thinks it is march,       ED Treatments / Results  Labs (all labs ordered are listed, but only abnormal results are displayed) Labs Reviewed  BLOOD GAS, VENOUS - Abnormal; Notable for the following components:      Result Value   Bicarbonate 30.3 (*)    Acid-Base Excess 3.9 (*)    All other components within normal limits  CBC WITH DIFFERENTIAL/PLATELET - Abnormal; Notable for the following components:   WBC 11.0 (*)    All other components within normal limits  RAPID URINE  DRUG SCREEN, HOSP PERFORMED - Abnormal; Notable for the following components:   Benzodiazepines POSITIVE (*)    All other components within normal limits  SARS CORONAVIRUS 2 (HOSPITAL ORDER, PERFORMED IN [facility])  COMPREHENSIVE METABOLIC PANEL  URINALYSIS, ROUTINE W REFLEX MICROSCOPIC  ETHANOL    EKG None  Radiology Ct Head Wo Contrast  Result Date: 06/11/2018 CLINICAL DATA:  63 year old male with confusion. EXAM: CT HEAD WITHOUT CONTRAST TECHNIQUE: Contiguous axial images were obtained from the base of the skull through the vertex without intravenous contrast. COMPARISON:  None. FINDINGS: Brain: The ventricles and sulci appropriate size for patient's age. The gray-white matter discrimination is preserved. There is no acute intracranial hemorrhage. No mass effect or midline shift. No extra-axial fluid collection. Vascular: No hyperdense vessel or unexpected calcification. Skull: Normal. Negative for fracture or focal lesion. Sinuses/Orbits: No acute finding. Other: There is a 2.5 x 1.0 cm exostosis from the outer table of the left temporal calvarium. IMPRESSION: No acute intracranial pathology. Electronically Signed   By: Elgie Collard M.D.   On: 06/11/2018 20:11   Dg Chest Port 1 View  Result Date: 06/11/2018 CLINICAL DATA:  Altered mental status/confusion following ketamine drip this morning. EXAM: PORTABLE CHEST 1 VIEW COMPARISON:  09/28/2016 FINDINGS: The cardiomediastinal silhouette is unchanged with normal heart size. The lungs remain hyperinflated with history of emphysema. No airspace consolidation, edema, pleural effusion, pneumothorax is identified. No acute osseous abnormality is seen. IMPRESSION: No active disease. Electronically Signed   By: Sebastian Ache M.D.   On: 06/11/2018 20:14    Procedures Procedures (including critical care time)  Medications Ordered in ED Medications - No data to display   Initial Impression / Assessment and Plan / ED Course  I  have reviewed the triage vital signs and the nursing notes.  Pertinent labs & imaging results that were available during my care of the patient were reviewed by me and considered in my medical decision making (see chart for details).        MDM  Ct head normal, chest xray is normal.   Pt observed.  Pt more awake, less confused.   I suspect symptoms are sedation from medications.  I doubt cva. Pt's care turned over to Ivar Drape [facility]  Final Clinical Impressions(s) / ED Diagnoses   Final diagnoses:  Altered mental status, unspecified altered mental status type    ED Discharge Orders    None       Osie Cheeks 06/11/18 2155    Elson Areas, [place] 06/11/18 2213    Little, Fleet Contras  Lequita HaltMorgan, MD 06/11/18 2233

## 2018-06-11 NOTE — ED Notes (Signed)
Pt continuously tried to get out of bed. Wife is at bedside and was instructed to call out if pt tries to get out of bed. Non slip socks were placed on the pt.
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
## 2018-06-11 NOTE — ED Notes (Signed)
Patient transported to CT 

## 2018-06-12 MED ORDER — NICOTINE 21 MG/24HR TD PT24: 21.0000 mg | MEDICATED_PATCH | Freq: Once | TRANSDERMAL | Status: DC

## 2018-06-12 NOTE — ED Notes (Addendum)
Patient able to ambulate with assistance upon discharge. Patient stable, A&O x2 and able to follow commands; accompanied with significant other. Discharge instructions reviewed and opportunity for questions and concerns allowed.

## 2018-06-12 NOTE — Discharge Instructions (Addendum)
Dial 911 or return to the ER if symptoms change or worsen.

## 2020-05-06 ENCOUNTER — Other Ambulatory Visit (HOSPITAL_COMMUNITY): Payer: Self-pay

## 2020-11-08 ENCOUNTER — Ambulatory Visit: Payer: Medicare Other | Admitting: Podiatry

## 2020-11-22 ENCOUNTER — Ambulatory Visit (INDEPENDENT_AMBULATORY_CARE_PROVIDER_SITE_OTHER): Payer: Medicare Other | Admitting: Podiatry

## 2020-11-22 DIAGNOSIS — Z91199 Patient's noncompliance with other medical treatment and regimen due to unspecified reason: Secondary | ICD-10-CM

## 2020-12-28 ENCOUNTER — Other Ambulatory Visit: Payer: Self-pay | Admitting: Orthopaedic Surgery

## 2020-12-28 DIAGNOSIS — M79671 Pain in right foot: Secondary | ICD-10-CM

## 2020-12-28 DIAGNOSIS — M25571 Pain in right ankle and joints of right foot: Secondary | ICD-10-CM

## 2021-01-10 ENCOUNTER — Ambulatory Visit
Admission: RE | Admit: 2021-01-10 | Discharge: 2021-01-10 | Disposition: A | Discharge status: Home / Self Care | Payer: Worker's Compensation | Source: Ambulatory Visit | Attending: Orthopaedic Surgery | Admitting: Orthopaedic Surgery

## 2021-01-10 ENCOUNTER — Other Ambulatory Visit: Payer: Self-pay

## 2021-01-10 DIAGNOSIS — M25571 Pain in right ankle and joints of right foot: Secondary | ICD-10-CM

## 2021-01-10 DIAGNOSIS — M79671 Pain in right foot: Secondary | ICD-10-CM

## 2021-07-08 ENCOUNTER — Other Ambulatory Visit (HOSPITAL_COMMUNITY): Payer: Self-pay | Admitting: Orthopedic Surgery

## 2021-07-08 ENCOUNTER — Ambulatory Visit (HOSPITAL_COMMUNITY)
Admission: RE | Admit: 2021-07-08 | Discharge: 2021-07-08 | Disposition: A | Discharge status: Home / Self Care | Payer: Medicare Other | Source: Ambulatory Visit | Attending: Orthopedic Surgery | Admitting: Orthopedic Surgery

## 2021-07-08 DIAGNOSIS — L03115 Cellulitis of right lower limb: Secondary | ICD-10-CM | POA: Insufficient documentation

## 2021-07-14 ENCOUNTER — Ambulatory Visit: Payer: Medicare Other

## 2021-07-14 ENCOUNTER — Ambulatory Visit (INDEPENDENT_AMBULATORY_CARE_PROVIDER_SITE_OTHER): Payer: Medicare Other | Admitting: Podiatry

## 2021-07-14 DIAGNOSIS — L03039 Cellulitis of unspecified toe: Secondary | ICD-10-CM

## 2021-07-14 DIAGNOSIS — G90521 Complex regional pain syndrome I of right lower limb: Secondary | ICD-10-CM | POA: Diagnosis not present

## 2021-07-14 DIAGNOSIS — M79671 Pain in right foot: Secondary | ICD-10-CM | POA: Diagnosis not present

## 2021-07-14 MED ORDER — CEPHALEXIN 500 MG PO CAPS: 500.0000 mg | ORAL_CAPSULE | Freq: Three times a day (TID) | ORAL | 0 refills | Status: DC
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
## 2021-08-11 ENCOUNTER — Ambulatory Visit: Payer: Medicare Other | Admitting: Podiatry

## 2021-12-04 ENCOUNTER — Emergency Department (HOSPITAL_COMMUNITY): Payer: Medicare Other

## 2021-12-04 ENCOUNTER — Encounter (HOSPITAL_COMMUNITY): Payer: Self-pay

## 2021-12-04 ENCOUNTER — Emergency Department (HOSPITAL_COMMUNITY)
Admission: EM | Admit: 2021-12-04 | Discharge: 2021-12-04 | Disposition: A | Discharge status: Home / Self Care | Payer: Medicare Other | Attending: Emergency Medicine | Admitting: Emergency Medicine

## 2021-12-04 DIAGNOSIS — M25532 Pain in left wrist: Secondary | ICD-10-CM | POA: Insufficient documentation

## 2021-12-04 DIAGNOSIS — Y92410 Unspecified street and highway as the place of occurrence of the external cause: Secondary | ICD-10-CM | POA: Diagnosis not present

## 2021-12-04 DIAGNOSIS — M542 Cervicalgia: Secondary | ICD-10-CM | POA: Insufficient documentation

## 2021-12-04 DIAGNOSIS — M79671 Pain in right foot: Secondary | ICD-10-CM | POA: Diagnosis not present
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
## 2021-12-11 ENCOUNTER — Other Ambulatory Visit: Payer: Self-pay

## 2021-12-11 ENCOUNTER — Ambulatory Visit
Admission: RE | Admit: 2021-12-11 | Discharge: 2021-12-11 | Disposition: A | Discharge status: Home / Self Care | Payer: Medicare Other | Source: Ambulatory Visit | Attending: Physician Assistant | Admitting: Physician Assistant

## 2021-12-11 ENCOUNTER — Ambulatory Visit (INDEPENDENT_AMBULATORY_CARE_PROVIDER_SITE_OTHER): Payer: Medicare Other

## 2021-12-11 VITALS — BP 117/73 | HR 87 | Temp 98.2°F | Resp 18

## 2021-12-11 DIAGNOSIS — J441 Chronic obstructive pulmonary disease with (acute) exacerbation: Secondary | ICD-10-CM | POA: Diagnosis not present

## 2021-12-11 DIAGNOSIS — Z1152 Encounter for screening for COVID-19: Secondary | ICD-10-CM

## 2021-12-11 DIAGNOSIS — R0602 Shortness of breath: Secondary | ICD-10-CM

## 2021-12-11 DIAGNOSIS — R059 Cough, unspecified: Secondary | ICD-10-CM | POA: Diagnosis not present

## 2021-12-11 MED ORDER — IPRATROPIUM-ALBUTEROL 0.5-2.5 (3) MG/3ML IN SOLN
3.0000 mL | Freq: Once | RESPIRATORY_TRACT | Status: AC
  Administered 2021-12-11: 3 mL via RESPIRATORY_TRACT

## 2021-12-11 MED ORDER — AZITHROMYCIN 250 MG PO TABS: 250.0000 mg | ORAL_TABLET | Freq: Every day | ORAL | 0 refills | Status: DC

## 2021-12-11 MED ORDER — PREDNISONE 20 MG PO TABS: 40.0000 mg | ORAL_TABLET | Freq: Every day | ORAL | 0 refills | Status: AC

## 2021-12-11 NOTE — ED Notes (Signed)
Walking O2 was 87-90%

## 2021-12-11 NOTE — Discharge Instructions (Addendum)
  CXR clear- will treat to cover exacerbation of chronic lung issues.   Please follow up in ED if no improvement over the next 48 hours or sooner with any worsening.
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
## 2021-12-11 NOTE — ED Triage Notes (Signed)
Pt here for cough and congestion; pt sts some nausea; pt with fever and some body aches

## 2021-12-13 ENCOUNTER — Ambulatory Visit (HOSPITAL_COMMUNITY): Payer: Medicare Other

## 2022-06-09 ENCOUNTER — Ambulatory Visit
Admission: EM | Admit: 2022-06-09 | Discharge: 2022-06-09 | Disposition: A | Discharge status: Home / Self Care | Payer: Medicare Other | Attending: Internal Medicine | Admitting: Internal Medicine

## 2022-06-09 DIAGNOSIS — G90521 Complex regional pain syndrome I of right lower limb: Secondary | ICD-10-CM | POA: Diagnosis not present

## 2022-06-09 DIAGNOSIS — F17210 Nicotine dependence, cigarettes, uncomplicated: Secondary | ICD-10-CM

## 2022-06-09 DIAGNOSIS — J441 Chronic obstructive pulmonary disease with (acute) exacerbation: Secondary | ICD-10-CM | POA: Diagnosis not present

## 2022-06-09 DIAGNOSIS — T148XXS Other injury of unspecified body region, sequela: Secondary | ICD-10-CM | POA: Diagnosis not present

## 2022-06-09 DIAGNOSIS — G588 Other specified mononeuropathies: Secondary | ICD-10-CM

## 2022-06-09 MED ORDER — ALBUTEROL SULFATE HFA 108 (90 BASE) MCG/ACT IN AERS: 1.0000 | INHALATION_SPRAY | Freq: Four times a day (QID) | RESPIRATORY_TRACT | 0 refills | Status: DC | PRN

## 2022-06-09 MED ORDER — IPRATROPIUM-ALBUTEROL 0.5-2.5 (3) MG/3ML IN SOLN
3.0000 mL | Freq: Once | RESPIRATORY_TRACT | Status: AC
  Administered 2022-06-09: 3 mL via RESPIRATORY_TRACT

## 2022-06-09 MED ORDER — GABAPENTIN 300 MG PO CAPS: 300.0000 mg | ORAL_CAPSULE | Freq: Three times a day (TID) | ORAL | 0 refills | Status: DC

## 2022-06-09 MED ORDER — DEXAMETHASONE SODIUM PHOSPHATE 10 MG/ML IJ SOLN
10.0000 mg | Freq: Once | INTRAMUSCULAR | Status: AC
  Administered 2022-06-09: 10 mg via INTRAMUSCULAR

## 2022-06-09 MED ORDER — PREDNISONE 20 MG PO TABS: 40.0000 mg | ORAL_TABLET | Freq: Every day | ORAL | 0 refills | Status: AC

## 2022-06-09 NOTE — ED Triage Notes (Signed)
Pts wife states that 4 yrs ago a load of lumber fell on pts right ft. Crushed foot. Pt is out of gabapentin.  Pt has perm nerve damage.  Pt states that he's in 10/10 pain.

## 2022-06-09 NOTE — Discharge Instructions (Addendum)
Take gabapentin as prescribed.  Follow-up with your primary care doctor (Dr. Olevia Perches) as scheduled on Tuesday.  Take azithromycin as prescribed. This is an antibiotic for your COPD exacerbation.  Take prednisone 40mg  once daily for the next 5 days with food to calm down inflammation to your chest due to your COPD attack.  Use albuterol inhaler every 4-6 hours as needed for wheezing and shortness of breath.  If you develop any new or worsening symptoms or do not improve in the next 2 to 3 days, please return.  If your symptoms are severe, please go to the emergency room.  Follow-up with your primary care provider for further evaluation and management of your symptoms as well as ongoing wellness visits.  I hope you feel better!

## 2022-06-09 NOTE — ED Provider Notes (Signed)
[name]    CSN: [id number] Arrival date & time: 06/09/22  1920      History   Chief Complaint Chief Complaint  Patient presents with   Foot Pain    HPI Aaron Stevens is a 67 y.o. male.   Patient presents to urgent care for evaluation of chronic neuropathic right foot pain as a result of injury approximately 4 years ago when a 70 pound piece of metal dropped onto his right foot. He takes gabapentin 300mg  every 6 hours daily and tramadol as needed for chronic pain. He has been without gabapentin for a few days and states the last time he picked up his prescriptions, he was given tramadol but was never given gabapentin. Attempted to get medication from [facility] but was unable to get refill. He is unable to get in with his PCP for new order for gabapentin until 4 days from now on Tuesday Jun 13, 2022. Requesting enough gabapentin to get him through until PCP appointment (Dr. Olevia Perches in [facility]). No recent new injuries to the right foot or redness, swelling, warmth, or rash.   There is noticeable wheezing when interviewing patient. When questioned about wheezing, he states he has been "coughing for a long time" but cough worsened over the last few days. He is coughing up yellow/clear mucous. Current everyday cigarette smoker for "years" but states he has cut back recently. Reports shortness of breath with bilateral chest tightness that is worse with coughing. Denies fevers, chills, nausea, vomiting, abdominal pain, rash, sore throat, and recent known sick contacts with similar symptoms. No history of oxygen use or supplemental oxygen requirement. He has had to use a rescue inhaler in the past but does not currently have access to one. No recent antibiotic or steroid use. History of emphysema.    Foot Pain    Past Medical History:  Diagnosis Date   Atherosclerosis of artery    Emphysema of lung (HCC)    Pneumonia     There are no problems to display for this  patient.   Past Surgical History:  Procedure Laterality Date   APPENDECTOMY     KNEE SURGERY         Home Medications    Prior to Admission medications   Medication Sig Start Date End Date Taking? Authorizing Provider  acetaminophen (TYLENOL) 500 MG tablet Take 1,000 mg by mouth every 6 (six) hours as needed. Pain   Yes [provider]  albuterol (VENTOLIN HFA) 108 (90 Base) MCG/ACT inhaler Inhale 1-2 puffs into the lungs every 6 (six) hours as needed for wheezing or shortness of breath. 06/09/22  Yes Carlisle Beers, FNP  azithromycin (ZITHROMAX) 250 MG tablet Take 1 tablet (250 mg total) by mouth daily. Take first 2 tablets together, then 1 every day until finished. 12/11/21  Yes Tomi Bamberger, PA-C  cephALEXin (KEFLEX) 500 MG capsule Take 1 capsule (500 mg total) by mouth 3 (three) times daily. 07/14/21  Yes Vivi Barrack, DPM  gabapentin (NEURONTIN) 300 MG capsule Take 1 capsule (300 mg total) by mouth 3 (three) times daily for 5 days. 06/09/22 06/14/22 Yes Javan Gonzaga, Donavan Burnet, FNP  predniSONE (DELTASONE) 20 MG tablet Take 2 tablets (40 mg total) by mouth daily for 5 days. 06/09/22 06/14/22 Yes Shephanie Romas, Donavan Burnet, FNP  traMADol (ULTRAM) 50 MG tablet Take 50 mg by mouth every 8 (eight) hours as needed for moderate pain.  06/05/18  Yes [provider]    Family History  History reviewed. No pertinent family history.  Social History Social History   Tobacco Use   Smoking status: Every Day    Packs/day: .5    Types: Cigarettes   Smokeless tobacco: Never  Vaping Use   Vaping Use: Never used  Substance Use Topics   Alcohol use: No   Drug use: No     Allergies   Sulfa antibiotics and Bee venom   Review of Systems Review of Systems Per HPI  Physical Exam Triage Vital Signs ED Triage Vitals  Enc Vitals Group     BP 06/09/22 1932 125/69     Pulse Rate 06/09/22 1932 (!) 103     Resp 06/09/22 1932 (!) 22     Temp 06/09/22 1932 98 F (36.7  C)     Temp Source 06/09/22 1932 Oral     SpO2 06/09/22 1932 93 %     Weight 06/09/22 1930 126 lb (57.2 kg)     Height --      Head Circumference --      Peak Flow --      Pain Score 06/09/22 1930 10     Pain Loc --      Pain Edu? --      Excl. in GC? --    No data found.  Updated Vital Signs BP 125/69 (BP Location: Left Arm)   Pulse (!) 103   Temp 98 F (36.7 C) (Oral)   Resp (!) 22   Wt 126 lb (57.2 kg)   SpO2 93%   BMI 19.73 kg/m   Visual Acuity Right Eye Distance:   Left Eye Distance:   Bilateral Distance:    Right Eye Near:   Left Eye Near:    Bilateral Near:     Physical Exam Vitals and nursing note reviewed.  Constitutional:      Appearance: He is ill-appearing. He is not toxic-appearing.  HENT:     Head: Normocephalic and atraumatic.     Right Ear: Hearing, tympanic membrane, ear canal and external ear normal.     Left Ear: Hearing, tympanic membrane, ear canal and external ear normal.     Nose: Nose normal.     Mouth/Throat:     Lips: Pink.     Mouth: Mucous membranes are moist. No injury.     Tongue: No lesions. Tongue does not deviate from midline.     Palate: No mass and lesions.     Pharynx: Oropharynx is clear. Uvula midline. No pharyngeal swelling, oropharyngeal exudate, posterior oropharyngeal erythema or uvula swelling.     Tonsils: No tonsillar exudate or tonsillar abscesses.  Eyes:     General: Lids are normal. Vision grossly intact. Gaze aligned appropriately.     Extraocular Movements: Extraocular movements intact.     Conjunctiva/sclera: Conjunctivae normal.  Cardiovascular:     Rate and Rhythm: Normal rate and regular rhythm.     Pulses:          Posterior tibial pulses are 1+ on the right side and 1+ on the left side.     Heart sounds: Normal heart sounds, S1 normal and S2 normal.  Pulmonary:     Effort: Pulmonary effort is normal. No respiratory distress.     Breath sounds: Normal air entry. Wheezing (audible wheezing without  stethoscope, diffuse inspiratory and expiratory wheezing) present.  Musculoskeletal:     Cervical back: Neck supple.     Right lower leg: No edema.     Left lower leg: No  edema.     Right foot: Normal range of motion and normal capillary refill. Tenderness (diffuse tenderness to the dorsal aspect of the right foot) present. No swelling, deformity, bunion, prominent metatarsal heads, laceration, bony tenderness or crepitus. Normal pulse.     Left foot: Normal.  Feet:     Right foot:     Skin integrity: Skin integrity normal. No ulcer, blister, skin breakdown, erythema or warmth.     Left foot:     Skin integrity: Skin integrity normal.  Lymphadenopathy:     Cervical: No cervical adenopathy.  Skin:    General: Skin is warm and dry.     Capillary Refill: Capillary refill takes less than 2 seconds.     Findings: No rash.  Neurological:     General: No focal deficit present.     Mental Status: He is alert and oriented to person, place, and time. Mental status is at baseline.     Cranial Nerves: No dysarthria or facial asymmetry.     Motor: No weakness.     Gait: Gait abnormal (walks with slight limp favoring right foot).  Psychiatric:        Mood and Affect: Mood normal.        Speech: Speech normal.        Behavior: Behavior normal.        Thought Content: Thought content normal.        Judgment: Judgment normal.      UC Treatments / Results  Labs (all labs ordered are listed, but only abnormal results are displayed) Labs Reviewed - No data to display  EKG   Radiology No results found.  Procedures Procedures (including critical care time)  Medications Ordered in UC Medications  ipratropium-albuterol (DUONEB) 0.5-2.5 (3) MG/3ML nebulizer solution 3 mL (3 mLs Nebulization Given 06/09/22 1953)  dexamethasone (DECADRON) injection 10 mg (10 mg Intramuscular Given 06/09/22 1953)    Initial Impression / Assessment and Plan / UC Course  I have reviewed the triage vital signs  and the nursing notes.  Pertinent labs & imaging results that were available during my care of the patient were reviewed by me and considered in my medical decision making (see chart for details).   1. COPD exacerbation, nicotine dependence Cough, wheeze, shortness of breath, and chest tightness consistent with acute COPD exacerbation. Duoneb provided with significant relief of subjective shortness of breath and objective lung sounds. Albuterol inhaler sent to pharmacy to be used every 4-6 hours as needed for cough, shortness of breath, and wheezing. Dexamethasone 10mg  IM for inflammation and wheezing to the lungs. Prednisone burst 40mg  QD for 5 days each morning with breakfast. Advised to avoid NSAIDs when using prednisone. Encouraged follow-up with PCP for further evaluation and to discuss plan to prevent further COPD exacerbations. No indication for imaging of the lungs today based on stable cardiopulmonary exam and hemodynamically stable vital signs.   2. Complex regional pain syndrome of right lower limb, chronoic peripheral neuropathic pain after peripheral nerve injury PDMP reviewed. Dexamethasone IM and prednisone burst for COPD exacerbation will also help with foot pain and inflammation. Gabapentin 5 day supply sent to pharmacy to be taken as prescribed, discussed drowsiness precautions. Encouraged follow-up with PCP as scheduled in 4 days for ongoing management of chronic right foot pain. He is currently neurovascularly intact to his baseline.   Discussed physical exam and available lab work findings in clinic with patient.  Counseled patient regarding appropriate use of medications and potential side  effects for all medications recommended or prescribed today. Discussed red flag signs and symptoms of worsening condition,when to call the PCP office, return to urgent care, and when to seek higher level of care in the emergency department. Patient verbalizes understanding and agreement with plan.  All questions answered. Patient discharged in stable condition.   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  Follow-up with your primary care doctor (Dr. Olevia Perches) as scheduled on Tuesday.  Take azithromycin as prescribed. This is an antibiotic for your COPD exacerbation.  Take prednisone 40mg  once daily for the next 5 days with food to calm down inflammation to your chest due to your COPD attack.  Use albuterol inhaler every 4-6 hours as needed for wheezing and shortness of breath.      ED Prescriptions     Medication Sig Dispense Auth. Provider   gabapentin (NEURONTIN) 300 MG capsule Take 1 capsule (300 mg total) by mouth 3 (three) times daily for 5 days. 15 capsule Reita May M, FNP   predniSONE (DELTASONE) 20 MG tablet Take 2 tablets (40 mg total) by mouth daily for 5 days. 10 tablet Carlisle Beers, FNP   albuterol (VENTOLIN HFA) 108 (90 Base) MCG/ACT inhaler Inhale 1-2 puffs into the lungs every 6 (six) hours as needed for wheezing or shortness of breath. 8 g Carlisle Beers, FNP      I have reviewed the PDMP during this encounter.   Carlisle Beers, [place] 06/11/22 2157

## 2022-06-13 ENCOUNTER — Ambulatory Visit
Admission: RE | Admit: 2022-06-13 | Discharge: 2022-06-13 | Disposition: A | Discharge status: Home / Self Care | Payer: Medicare Other | Source: Ambulatory Visit | Attending: Emergency Medicine | Admitting: Emergency Medicine

## 2022-06-13 VITALS — BP 117/65 | HR 85 | Temp 98.1°F | Resp 20

## 2022-06-13 DIAGNOSIS — J439 Emphysema, unspecified: Secondary | ICD-10-CM

## 2022-06-13 MED ORDER — ALBUTEROL SULFATE (2.5 MG/3ML) 0.083% IN NEBU
2.5000 mg | INHALATION_SOLUTION | Freq: Once | RESPIRATORY_TRACT | Status: AC
  Administered 2022-06-13: 2.5 mg via RESPIRATORY_TRACT

## 2022-06-13 MED ORDER — SPACER/AERO-HOLDING CHAMBERS DEVI: 1.0000 | 0 refills | Status: DC | PRN

## 2022-06-13 MED ORDER — TIOTROPIUM BROMIDE MONOHYDRATE 18 MCG IN CAPS: 18.0000 ug | ORAL_CAPSULE | Freq: Every day | RESPIRATORY_TRACT | 1 refills | Status: DC

## 2022-06-13 MED ORDER — IPRATROPIUM-ALBUTEROL 0.5-2.5 (3) MG/3ML IN SOLN
3.0000 mL | Freq: Once | RESPIRATORY_TRACT | Status: AC
  Administered 2022-06-13: 3 mL via RESPIRATORY_TRACT

## 2022-06-13 NOTE — Discharge Instructions (Addendum)
Please find a new primary care provider as soon as possible.   Start using the tiotropium inhaler today. It is a once a day inhaler to use every day, even if you are feeling ok.   Use the albuterol inhaler with the spacer when you are having trouble breathing and need a rescue inhaler. You can use it every 4-6 hours, 2 puffs (space them a minute apart) if needed.   If you are having trouble breathing and the rescue albuterol inhaler isn't helping, please go to an urgent care or ER if your symptoms are severe.   Make a plan for quitting smoking. When you are ready, you can do a [place] Evisit for smoking cessation medication (like nicotine patches).  [email or web address]

## 2022-06-13 NOTE — ED Triage Notes (Signed)
Patient with c/o difficulty breathing and wheezing. Patient states he was here Saturday and given a breathing tx while here. Patient was prescribed prednisone and an inhaler and doesn't feel like he's getting better. Was at his doctor's office today and they told him to come here.

## 2022-06-14 NOTE — ED Provider Notes (Signed)
[name]    CSN: [id number] Arrival date & time: 06/13/22  1413      History   Chief Complaint Chief Complaint  Patient presents with   Wheezing    HPI Aaron Stevens is a 67 y.o. male. He reports wheezing and SOB that has not improved since last urgent care visit 06/09/22. He does not have a PCP. He was seen today by his pain management provider who advised him to go to ED for his breathing. He chose instead to come to urgent care. Feels albuterol inhaler is not helping. He does smoke - has smoked for 50 years.   He is taking azithromycin as prescribed. Still has 2 more days   Wheezing   Past Medical History:  Diagnosis Date   Atherosclerosis of artery    Emphysema of lung (HCC)    Pneumonia     There are no problems to display for this patient.   Past Surgical History:  Procedure Laterality Date   APPENDECTOMY     KNEE SURGERY         Home Medications    Prior to Admission medications   Medication Sig Start Date End Date Taking? Authorizing Provider  Spacer/Aero-Holding Chambers DEVI 1 each by Does not apply route as needed. 06/13/22  Yes Cathlyn Parsons, NP  tiotropium (SPIRIVA) 18 MCG inhalation capsule Place 1 capsule (18 mcg total) into inhaler and inhale daily. 06/13/22  Yes Cathlyn Parsons, NP  acetaminophen (TYLENOL) 500 MG tablet Take 1,000 mg by mouth every 6 (six) hours as needed. Pain    [provider]  albuterol (VENTOLIN HFA) 108 (90 Base) MCG/ACT inhaler Inhale 1-2 puffs into the lungs every 6 (six) hours as needed for wheezing or shortness of breath. 06/09/22   Carlisle Beers, FNP  azithromycin (ZITHROMAX) 250 MG tablet Take 1 tablet (250 mg total) by mouth daily. Take first 2 tablets together, then 1 every day until finished. 12/11/21   Tomi Bamberger, PA-C  cephALEXin (KEFLEX) 500 MG capsule Take 1 capsule (500 mg total) by mouth 3 (three) times daily. 07/14/21   Vivi Barrack, DPM  gabapentin  (NEURONTIN) 300 MG capsule Take 1 capsule (300 mg total) by mouth 3 (three) times daily for 5 days. 06/09/22 06/14/22  Carlisle Beers, FNP  predniSONE (DELTASONE) 20 MG tablet Take 2 tablets (40 mg total) by mouth daily for 5 days. 06/09/22 06/14/22  Carlisle Beers, FNP  traMADol (ULTRAM) 50 MG tablet Take 50 mg by mouth every 8 (eight) hours as needed for moderate pain.  06/05/18   [provider]    Family History History reviewed. No pertinent family history.  Social History Social History   Tobacco Use   Smoking status: Every Day    Packs/day: .5    Types: Cigarettes   Smokeless tobacco: Never  Vaping Use   Vaping Use: Never used  Substance Use Topics   Alcohol use: No   Drug use: No     Allergies   Sulfa antibiotics and Bee venom   Review of Systems Review of Systems  Respiratory:  Positive for wheezing.      Physical Exam Triage Vital Signs ED Triage Vitals  Enc Vitals Group     BP 06/13/22 1447 117/65     Pulse Rate 06/13/22 1447 85     Resp 06/13/22 1447 20     Temp 06/13/22 1447 98.1 F (36.7 C)     Temp Source 06/13/22  1447 Oral     SpO2 06/13/22 1447 91 %     Weight --      Height --      Head Circumference --      Peak Flow --      Pain Score 06/13/22 1448 0     Pain Loc --      Pain Edu? --      Excl. in GC? --    No data found.  Updated Vital Signs BP 117/65 (BP Location: Right Arm)   Pulse 85   Temp 98.1 F (36.7 C) (Oral)   Resp 20   SpO2 91%   Visual Acuity Right Eye Distance:   Left Eye Distance:   Bilateral Distance:    Right Eye Near:   Left Eye Near:    Bilateral Near:     Physical Exam Constitutional:      General: He is not in acute distress.    Appearance: Normal appearance. He is not ill-appearing.  Cardiovascular:     Rate and Rhythm: Normal rate and regular rhythm.  Pulmonary:     Effort: Pulmonary effort is normal. No respiratory distress.     Breath sounds: Wheezing and rhonchi present.   Neurological:     Mental Status: He is alert.      UC Treatments / Results  Labs (all labs ordered are listed, but only abnormal results are displayed) Labs Reviewed - No data to display  EKG   Radiology No results found.  Procedures Procedures (including critical care time)  Medications Ordered in UC Medications  ipratropium-albuterol (DUONEB) 0.5-2.5 (3) MG/3ML nebulizer solution 3 mL (3 mLs Nebulization Given 06/13/22 1458)  albuterol (PROVENTIL) (2.5 MG/3ML) 0.083% nebulizer solution 2.5 mg (2.5 mg Nebulization Given 06/13/22 1524)    Initial Impression / Assessment and Plan / UC Course  I have reviewed the triage vital signs and the nursing notes.  Pertinent labs & imaging results that were available during my care of the patient were reviewed by me and considered in my medical decision making (see chart for details).    Initially SpO2 91%. AFter duoneb, improved to 98%. Post-duoneb, lung sounds improved but still has wheezing. Given albuterol neb tx. At this time, pt reports he is breathing much easier and lung sounds improved. Still has some scattered wheeze adventitious sounds but he can clear them with coughing.   Safe to go home, SpO2 improved, pt feels better. Stressed importance of finding PCP. Discussed management of COPD/emphysema includes preventive inhalers. Rx spiriva, explaining new PCP is likely to change therapy. Rx spacer to use with albuterol, discussed use/how to use rescue inhaler. Discussed smoking cessation.   Final Clinical Impressions(s) / UC Diagnoses   Final diagnoses:  Pulmonary emphysema, unspecified emphysema type [facility])     Discharge Instructions      Please find a new primary care provider as soon as possible.   Start using the tiotropium inhaler today. It is a once a day inhaler to use every day, even if you are feeling ok.   Use the albuterol inhaler with the spacer when you are having trouble breathing and need a rescue inhaler.  You can use it every 4-6 hours, 2 puffs (space them a minute apart) if needed.   If you are having trouble breathing and the rescue albuterol inhaler isn't helping, please go to an urgent care or ER if your symptoms are severe.   Make a plan for quitting smoking. When you are ready, you  can do a [place] Evisit for smoking cessation medication (like nicotine patches).  [email or web address]   ED Prescriptions     Medication Sig Dispense Auth. Provider   Spacer/Aero-Holding Deretha [place] DEVI 1 each by Does not apply route as needed. 1 each Cathlyn Parsons, NP   tiotropium (SPIRIVA) 18 MCG inhalation capsule Place 1 capsule (18 mcg total) into inhaler and inhale daily. 30 capsule Cathlyn Parsons, NP      PDMP not reviewed this encounter.   Cathlyn Parsons, NP 06/14/22 1445

## 2022-10-22 ENCOUNTER — Ambulatory Visit (HOSPITAL_COMMUNITY): Payer: Self-pay

## 2022-10-22 ENCOUNTER — Ambulatory Visit
Admission: RE | Admit: 2022-10-22 | Discharge: 2022-10-22 | Disposition: A | Discharge status: Home / Self Care | Payer: Medicare Other | Source: Ambulatory Visit | Attending: Family Medicine | Admitting: Family Medicine

## 2022-10-22 VITALS — BP 126/71 | HR 79 | Temp 98.4°F | Resp 18

## 2022-10-22 DIAGNOSIS — T25121A Burn of first degree of right foot, initial encounter: Secondary | ICD-10-CM | POA: Diagnosis not present

## 2022-10-22 DIAGNOSIS — M79671 Pain in right foot: Secondary | ICD-10-CM | POA: Diagnosis not present

## 2022-10-22 NOTE — ED Notes (Signed)
Patient is being discharged from the Urgent Care and sent to the Emergency Department via pov . Per Ervin Knack, NP, patient is in need of higher level of care due to need for more complex evalaution. Patient is aware and verbalizes understanding of plan of care.  Vitals:   10/22/22 1209  BP: 126/71  Pulse: 79  Resp: 18  Temp: 98.4 F (36.9 C)  SpO2: 96%

## 2022-10-22 NOTE — ED Triage Notes (Signed)
Pt had a load of lumber fall on his foot 4 years ago. Sees Dr. Despina Hick is his ortho doc and sees Dr. Olevia Perches for pain management. Right foot has been swollen since Thursday. Pain medications aren't working. Both feet are red. States was out in the sun on Thursday. Hx of RSD

## 2022-10-22 NOTE — ED Provider Notes (Signed)
[name]    CSN: [id number] Arrival date & time: 10/22/22  1150      History   Chief Complaint Chief Complaint  Patient presents with   Foot Pain    HPI Aaron Stevens is a 67 y.o. male.   Patient presents with right foot pain that started a few days ago.  Wife is present who helps provide majority of history.  She reports that approximately 4 years ago, he had a pile of lumber dropped on his foot causing multiple fractures.  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  He has taken tramadol and gabapentin.  Reports over the past few days, the pain has increased after he went fishing.  Denies any recent injury to the foot.  Although, they do report that he got sunburn on his feet.  Denies any fever.   Foot Pain    Past Medical History:  Diagnosis Date   Atherosclerosis of artery    Emphysema of lung (HCC)    Pneumonia     There are no problems to display for this patient.   Past Surgical History:  Procedure Laterality Date   APPENDECTOMY     KNEE SURGERY         Home Medications    Prior to Admission medications   Medication Sig Start Date End Date Taking? Authorizing Provider  acetaminophen (TYLENOL) 500 MG tablet Take 1,000 mg by mouth every 6 (six) hours as needed. Pain    [provider]  albuterol (VENTOLIN HFA) 108 (90 Base) MCG/ACT inhaler Inhale 1-2 puffs into the lungs every 6 (six) hours as needed for wheezing or shortness of breath. 06/09/22   Carlisle Beers, FNP  azithromycin (ZITHROMAX) 250 MG tablet Take 1 tablet (250 mg total) by mouth daily. Take first 2 tablets together, then 1 every day until finished. 12/11/21   Tomi Bamberger, PA-C  cephALEXin (KEFLEX) 500 MG capsule Take 1 capsule (500 mg total) by mouth 3 (three) times daily. 07/14/21   Vivi Barrack, DPM  gabapentin (NEURONTIN) 300 MG capsule Take 1 capsule  (300 mg total) by mouth 3 (three) times daily for 5 days. 06/09/22 06/14/22  Carlisle Beers, FNP  Spacer/Aero-Holding Deretha Emory DEVI 1 each by Does not apply route as needed. 06/13/22   Cathlyn Parsons, NP  tiotropium (SPIRIVA) 18 MCG inhalation capsule Place 1 capsule (18 mcg total) into inhaler and inhale daily. 06/13/22   Cathlyn Parsons, NP  traMADol (ULTRAM) 50 MG tablet Take 50 mg by mouth every 8 (eight) hours as needed for moderate pain.  06/05/18   [provider]    Family History No family history on file.  Social History Social History   Tobacco Use   Smoking status: Every Day    Current packs/day: 0.50    Types: Cigarettes   Smokeless tobacco: Never  Vaping Use   Vaping status: Never Used  Substance Use Topics   Alcohol use: No   Drug use: No     Allergies   Sulfa antibiotics and Bee venom   Review of Systems Review of Systems Per HPI  Physical Exam Triage Vital Signs ED Triage Vitals [10/22/22 1209]  Encounter Vitals Group     BP 126/71     Systolic BP Percentile      Diastolic BP Percentile      Pulse Rate 79  Resp 18     Temp 98.4 F (36.9 C)     Temp Source Oral     SpO2 96 %     Weight      Height      Head Circumference      Peak Flow      Pain Score 8     Pain Loc      Pain Education      Exclude from Growth Chart    No data found.  Updated Vital Signs BP 126/71 (BP Location: Left Arm)   Pulse 79   Temp 98.4 F (36.9 C) (Oral)   Resp 18   SpO2 96%   Visual Acuity Right Eye Distance:   Left Eye Distance:   Bilateral Distance:    Right Eye Near:   Left Eye Near:    Bilateral Near:     Physical Exam Constitutional:      General: He is not in acute distress.    Appearance: Normal appearance. He is not toxic-appearing or diaphoretic.  HENT:     Head: Normocephalic and atraumatic.  Eyes:     Extraocular Movements: Extraocular movements intact.     Conjunctiva/sclera: Conjunctivae normal.  Pulmonary:      Effort: Pulmonary effort is normal.  Feet:     Comments: Patient has diffuse erythema throughout bilateral dorsal surface of feet.  Patient has significant tenderness to palpation with only light touch to the right foot at the top of the foot and to the ball of the foot.  Patient can wiggle toes.  Unable to adequately assess neurovascular status given patient is having difficulty with any type of palpation of foot.  There is a small blister that is approximately 1 cm and is intact to the lateral portion of the right foot that is most likely due to sunburn. Neurological:     General: No focal deficit present.     Mental Status: He is alert and oriented to person, place, and time. Mental status is at baseline.  Psychiatric:        Mood and Affect: Mood normal.        Behavior: Behavior normal.        Thought Content: Thought content normal.        Judgment: Judgment normal.      UC Treatments / Results  Labs (all labs ordered are listed, but only abnormal results are displayed) Labs Reviewed - No data to display  EKG   Radiology No results found.  Procedures Procedures (including critical care time)  Medications Ordered in UC Medications - No data to display  Initial Impression / Assessment and Plan / UC Course  I have reviewed the triage vital signs and the nursing notes.  Pertinent labs & imaging results that were available during my care of the patient were reviewed by me and considered in my medical decision making (see chart for details).     Patient's pain could be due to sunburn given history of his complex pain syndrome as this could have increased pain.  Although, given how intense patient's pain is and the inability to assess neurovascular status appropriately, I do think that more extensive evaluation is necessary as he may need to have more advanced imaging of the foot to rule out osteomyelitis versus infection.  Also do not have methods to control pain here in urgent  care so will defer patient to the ER for further evaluation and management.  Patient and wife were agreeable  with plan.  The left via self transport as his vital signs are stable. Final Clinical Impressions(s) / UC Diagnoses   Final diagnoses:  None   Discharge Instructions   None    ED Prescriptions   None    PDMP not reviewed this encounter.   Gustavus Bryant, [place] 10/22/22 [id number]

## 2022-10-29 ENCOUNTER — Encounter (HOSPITAL_COMMUNITY): Payer: Self-pay

## 2022-10-29 ENCOUNTER — Emergency Department (HOSPITAL_COMMUNITY)
Admission: EM | Admit: 2022-10-29 | Discharge: 2022-10-29 | Disposition: A | Discharge status: Home / Self Care | Payer: Medicare Other | Attending: Emergency Medicine | Admitting: Emergency Medicine

## 2022-10-29 ENCOUNTER — Other Ambulatory Visit: Payer: Self-pay

## 2022-10-29 DIAGNOSIS — J439 Emphysema, unspecified: Secondary | ICD-10-CM

## 2022-10-29 DIAGNOSIS — M79671 Pain in right foot: Secondary | ICD-10-CM | POA: Insufficient documentation

## 2022-10-29 DIAGNOSIS — T25121D Burn of first degree of right foot, subsequent encounter: Secondary | ICD-10-CM

## 2022-10-29 MED ORDER — ALBUTEROL SULFATE HFA 108 (90 BASE) MCG/ACT IN AERS: 1.0000 | INHALATION_SPRAY | Freq: Four times a day (QID) | RESPIRATORY_TRACT | 0 refills | Status: DC | PRN

## 2022-10-29 MED ORDER — HYDROCORTISONE 1 % EX CREA: TOPICAL_CREAM | CUTANEOUS | 0 refills | Status: DC

## 2022-10-29 MED ORDER — ALBUTEROL SULFATE HFA 108 (90 BASE) MCG/ACT IN AERS
1.0000 | INHALATION_SPRAY | Freq: Once | RESPIRATORY_TRACT | Status: AC
  Administered 2022-10-29: 2 via RESPIRATORY_TRACT
  Filled 2022-10-29: qty 6.7

## 2022-10-29 NOTE — ED Triage Notes (Signed)
Patient is here for evaluation of right foot evaluation. Reports injury to foot about 4 years ago, but states over the past week he is having more swelling to the foot. Also concerned about infection to the foot. No drainage noted to the foot.

## 2022-10-29 NOTE — ED Provider Notes (Signed)
[place] EMERGENCY DEPARTMENT AT [facility] Provider Note   CSN: [id number] Arrival date & time: 10/29/22  1026     History  Chief Complaint  Patient presents with   Foot Pain    Aaron Stevens is a 67 y.o. male past medical history of emphysema, pneumonia and for neurology is presenting to the emergency room for 1-1/2 weeks of right foot sunburn, rash and pain.  Patient has also had long going history of right ankle pain due to injury that occurred 4 years ago.  Patient reports that after the sunburn he has had increased swelling of his right foot.  Has not tried anything for treatment.  Has not noticed any drainage or any other wounds.  Patient was seen at urgent care for the same thing and given no treatment.  HE denies any shortness of breath, chest pain chills fevers  Foot Pain       Home Medications Prior to Admission medications   Medication Sig Start Date End Date Taking? Authorizing Provider  acetaminophen (TYLENOL) 500 MG tablet Take 1,000 mg by mouth every 6 (six) hours as needed. Pain    [provider]  albuterol (VENTOLIN HFA) 108 (90 Base) MCG/ACT inhaler Inhale 1-2 puffs into the lungs every 6 (six) hours as needed for wheezing or shortness of breath. 06/09/22   Carlisle Beers, FNP  azithromycin (ZITHROMAX) 250 MG tablet Take 1 tablet (250 mg total) by mouth daily. Take first 2 tablets together, then 1 every day until finished. 12/11/21   Tomi Bamberger, PA-C  cephALEXin (KEFLEX) 500 MG capsule Take 1 capsule (500 mg total) by mouth 3 (three) times daily. 07/14/21   Vivi Barrack, DPM  gabapentin (NEURONTIN) 300 MG capsule Take 1 capsule (300 mg total) by mouth 3 (three) times daily for 5 days. 06/09/22 06/14/22  Carlisle Beers, FNP  Spacer/Aero-Holding Deretha Emory DEVI 1 each by Does not apply route as needed. 06/13/22   Cathlyn Parsons, NP  tiotropium (SPIRIVA) 18 MCG inhalation capsule Place 1 capsule (18 mcg total) into  inhaler and inhale daily. 06/13/22   Cathlyn Parsons, NP  traMADol (ULTRAM) 50 MG tablet Take 50 mg by mouth every 8 (eight) hours as needed for moderate pain.  06/05/18   [provider]      Allergies    Sulfa antibiotics and Bee venom    Review of Systems   Review of Systems  Skin:  Positive for rash.    Physical Exam Updated Vital Signs BP 124/64 (BP Location: Right Arm)   Pulse 71   Temp 98.1 F (36.7 C) (Oral)   Resp 16   Ht 5\' 7"  (1.702 m)   Wt 57.2 kg   SpO2 100%   BMI 19.75 kg/m  Physical Exam Vitals and nursing note reviewed.  Constitutional:      General: He is not in acute distress.    Appearance: He is not toxic-appearing.  HENT:     Head: Normocephalic and atraumatic.  Eyes:     General: No scleral icterus.    Conjunctiva/sclera: Conjunctivae normal.  Cardiovascular:     Rate and Rhythm: Normal rate and regular rhythm.     Pulses: Normal pulses.     Heart sounds: Normal heart sounds.  Pulmonary:     Effort: Pulmonary effort is normal. No respiratory distress.     Breath sounds: Normal breath sounds.  Abdominal:     General: Abdomen is flat. Bowel sounds are normal.  Palpations: Abdomen is soft.     Tenderness: There is no abdominal tenderness.  Skin:    General: Skin is warm and dry.     Findings: Erythema and rash present. No lesion.     Comments: Patient has bilateral superficial burn due to sunburn that occurred over a week and a half ago.  Patient also has right sided dorsal foot blister that is beginning to heal. Shows no sign of infection or cellulitis.  Wound has no drainage.  Neurological:     General: No focal deficit present.     Mental Status: He is alert and oriented to person, place, and time. Mental status is at baseline.     ED Results / Procedures / Treatments   Labs (all labs ordered are listed, but only abnormal results are displayed) Labs Reviewed - No data to display  EKG None  Radiology No results  found.  Procedures Procedures    Medications Ordered in ED Medications - No data to display  ED Course/ Medical Decision Making/ A&P                                 Medical Decision Making Risk Prescription drug management.   This patient presents to the ED for concern of right foot pain, this involves an extensive number of treatment options, and is a complaint that carries with it a high risk of complications and morbidity.  The differential diagnosis includes sunburn, abscess, cellulitis   Co morbidities that complicate the patient evaluation  Emphysema   Additional history obtained:  Additional history obtained from 10-22-2022 when he was seen for same thing.  At that time urgent care had recommended patient come to the emergency room for workup of osteomyelitis.  Today on exam symptoms have began to improve and no open deep wound seen.  Patient does have small area of sloughing skin after a blister from sunburn.  This area is well-healing nondraining.  Neurovascularly intact, he is able to bear weight and Dr Jacqulyn Bath agrees with clinical findings and treatment plan without imaging.    Lab Tests:  None    Imaging Studies ordered:  None   Cardiac Monitoring: / EKG:  Patient hemodynamically stable, no fever, vitals within normal limits   Consultations Obtained:  None   Problem List / ED Course / Critical interventions / Medication management  Patient reporting with sided ankle pain has been ongoing for 4 years.  His symptoms have changed after getting sunburn.  Sunburn is well-healing however he does have remaining blister on the right side of the dorsal aspect of foot.  Patient has not tried anything.  Blisters well-healing no signs of's surrounding erythema or abscess.  Given that patient's disease pain management for chronic ankle pain, no new medications or imaging obtained.  Exam findings are consistent with well-healing superficial burn.  Patient also has  history of emphysema and concerned because she does not have albuterol rescue inhaler.  Gave albuterol here and sent albuterol to pharmacy. I ordered medication including albuterol and hydrocortisone.  Patient is allergic to sulfa drugs thus did not order sulvadene. Patient has history of anaphylaxis.  Reevaluation of the patient after these medicines showed that the patient improved I have reviewed the patients home medicines and have made adjustments as needed   Plan Keep skin clean and covered. Proper hygiene, aloe, hydrocortisone if needed.  Ice for pain F/u w/ PCP in 2-3d to  ensure resolution of sx.  Patient was given return precautions. Patient stable for discharge at this time.  Patient educated on sx/dx and verbalized understanding of plan. Return to ER w/ new or worsening sx.          Final Clinical Impression(s) / ED Diagnoses Final diagnoses:  None    Rx / DC Orders ED Discharge Orders     None         Reinaldo Raddle 10/29/22 1917    Maia Plan, MD 11/07/22 1623

## 2022-10-29 NOTE — Discharge Instructions (Signed)
Seen in the emergency room for right foot pain.  Sent a topical medication to your pharmacy, please use as directed.  I have also sent rescue inhaler to your pharmacy, please take as needed for shortness of breath.  Follow-up with your primary care and return to the emergency room if you have any new or worsening symptoms.

## 2022-11-29 IMAGING — CR DG FOOT COMPLETE 3+V*R*
3 series · 3 of 3 positions shown · non-contrast
Comparison: Right foot radiographs 01/09/2018 and 03/26/2017

CLINICAL DATA: Right foot pain.  Crush injury 4 years ago.

EXAM:
RIGHT FOOT COMPLETE - 3+ VIEW

[foot ap]
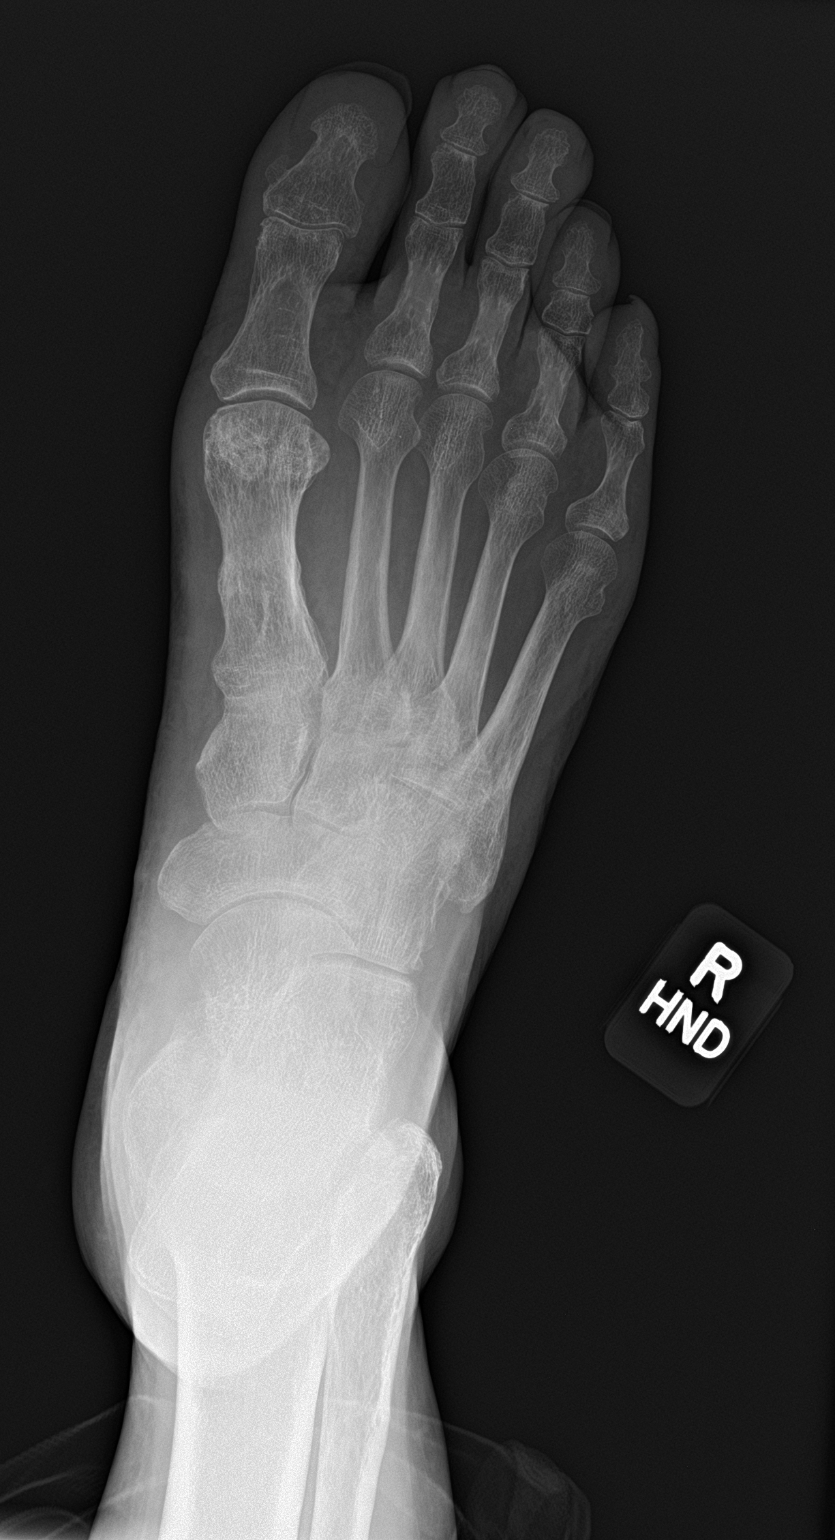

[foot obl]
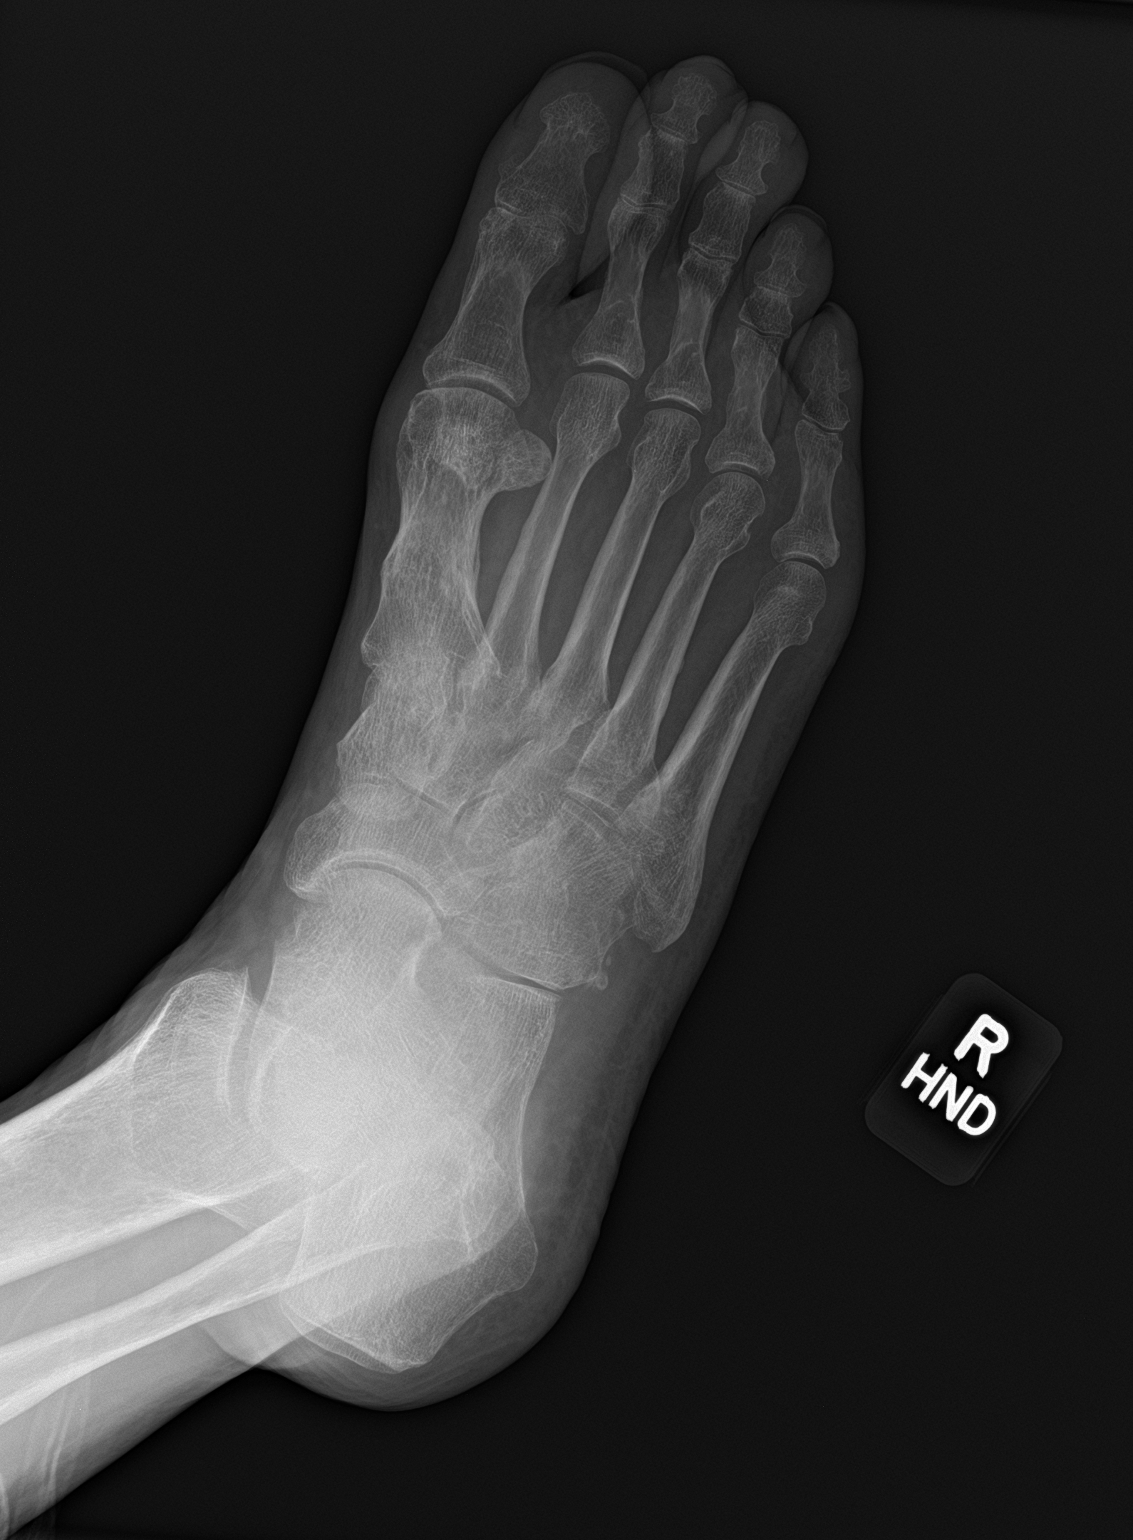

[foot lat]
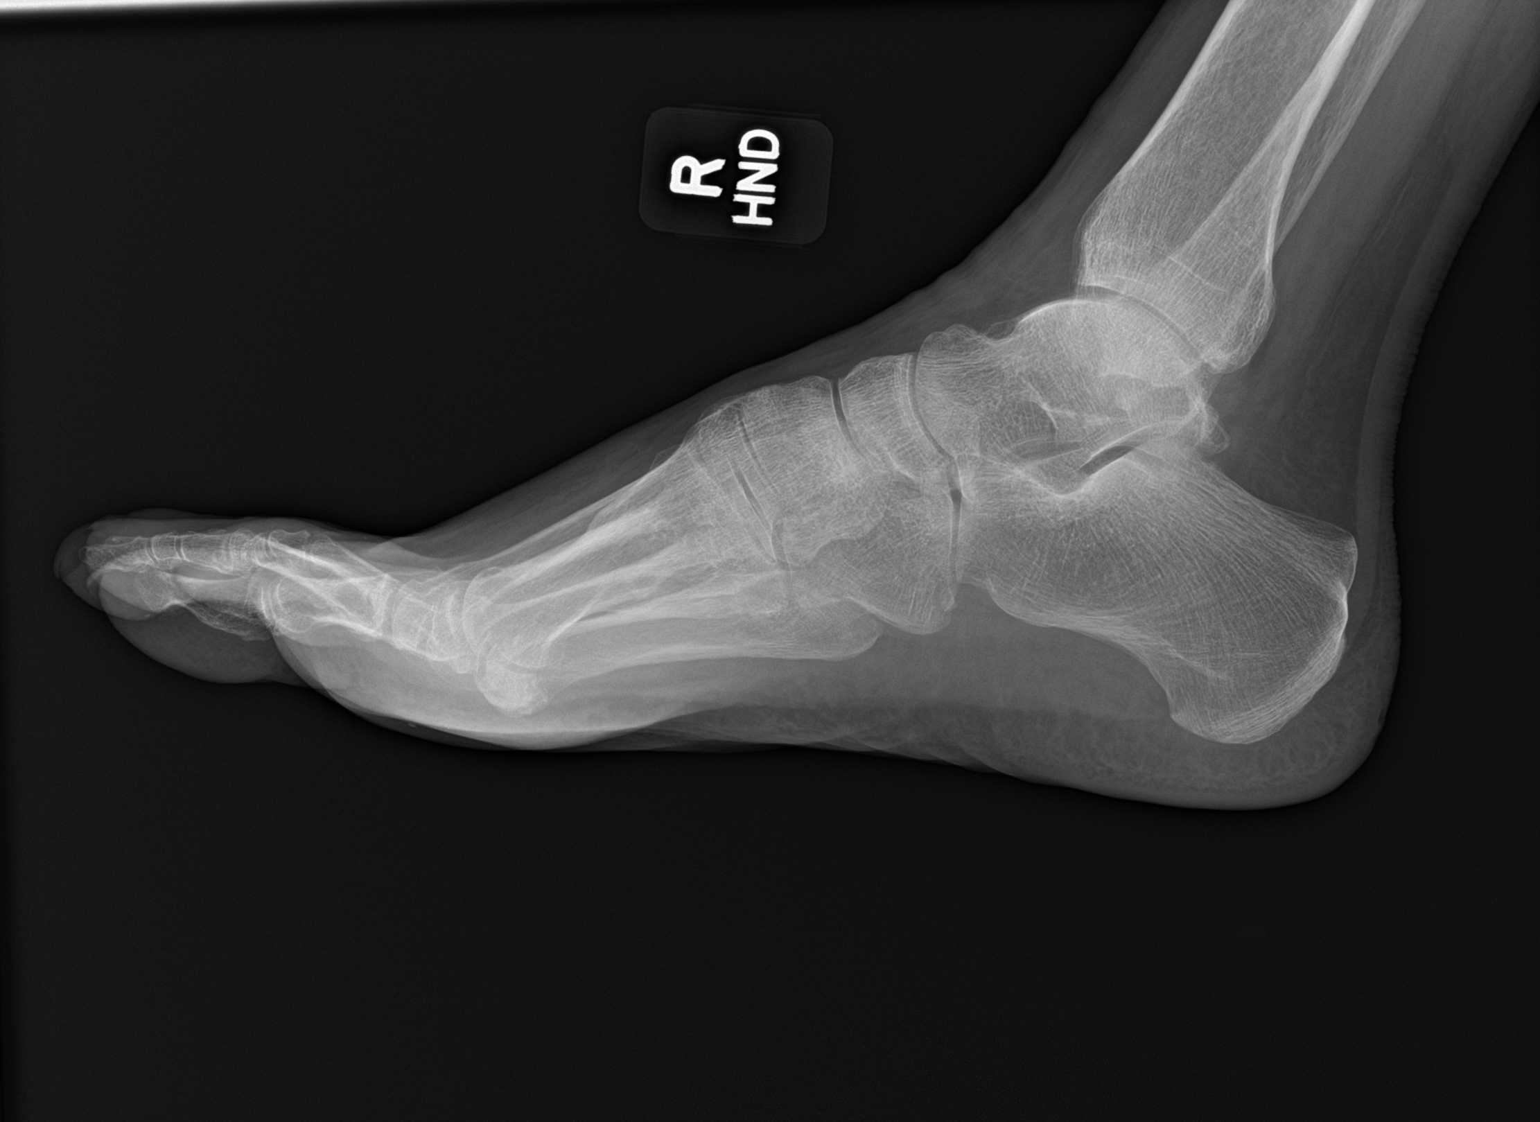

[3 of 3 positions shown; findings below may reference images not displayed]

FINDINGS: There is now complete healing of the previously seen right first
metatarsal proximal shaft fracture with peripheral convex cortical
thickening. Mild joint space narrowing of the interphalangeal joints
diffusely. Moderate first through third tarsometatarsal joint space
narrowing. No acute fracture is seen. No dislocation.
IMPRESSION: 1. Complete healing of prior slightly proximal aspect of the great
toe metatarsal shaft fracture.
2. Moderate first through third tarsometatarsal osteoarthritis is
similar to prior.

## 2023-01-09 ENCOUNTER — Emergency Department (HOSPITAL_COMMUNITY): Payer: Medicare Other

## 2023-01-09 ENCOUNTER — Inpatient Hospital Stay (HOSPITAL_COMMUNITY)
Admission: EM | Admit: 2023-01-09 | Discharge: 2023-01-14 | DRG: 445 | Disposition: A | Discharge status: Home / Self Care | Payer: Medicare Other | Attending: Internal Medicine | Admitting: Internal Medicine

## 2023-01-09 ENCOUNTER — Other Ambulatory Visit: Payer: Self-pay

## 2023-01-09 ENCOUNTER — Encounter (HOSPITAL_COMMUNITY): Payer: Self-pay

## 2023-01-09 DIAGNOSIS — J439 Emphysema, unspecified: Secondary | ICD-10-CM | POA: Diagnosis present

## 2023-01-09 DIAGNOSIS — K8000 Calculus of gallbladder with acute cholecystitis without obstruction: Secondary | ICD-10-CM | POA: Diagnosis present

## 2023-01-09 DIAGNOSIS — Z91030 Bee allergy status: Secondary | ICD-10-CM

## 2023-01-09 DIAGNOSIS — Z882 Allergy status to sulfonamides status: Secondary | ICD-10-CM

## 2023-01-09 DIAGNOSIS — R1011 Right upper quadrant pain: Principal | ICD-10-CM

## 2023-01-09 DIAGNOSIS — J441 Chronic obstructive pulmonary disease with (acute) exacerbation: Secondary | ICD-10-CM | POA: Diagnosis present

## 2023-01-09 DIAGNOSIS — K8689 Other specified diseases of pancreas: Secondary | ICD-10-CM | POA: Diagnosis present

## 2023-01-09 DIAGNOSIS — Z720 Tobacco use: Secondary | ICD-10-CM

## 2023-01-09 DIAGNOSIS — Q631 Lobulated, fused and horseshoe kidney: Secondary | ICD-10-CM

## 2023-01-09 DIAGNOSIS — Z79899 Other long term (current) drug therapy: Secondary | ICD-10-CM | POA: Diagnosis not present

## 2023-01-09 DIAGNOSIS — G894 Chronic pain syndrome: Secondary | ICD-10-CM | POA: Diagnosis present

## 2023-01-09 DIAGNOSIS — K8042 Calculus of bile duct with acute cholecystitis without obstruction: Secondary | ICD-10-CM | POA: Diagnosis present

## 2023-01-09 DIAGNOSIS — F17210 Nicotine dependence, cigarettes, uncomplicated: Secondary | ICD-10-CM | POA: Diagnosis present

## 2023-01-09 DIAGNOSIS — R1013 Epigastric pain: Secondary | ICD-10-CM | POA: Diagnosis present

## 2023-01-09 DIAGNOSIS — R1084 Generalized abdominal pain: Secondary | ICD-10-CM

## 2023-01-09 LAB — URINALYSIS, ROUTINE W REFLEX MICROSCOPIC
Bilirubin Urine: NEGATIVE
Glucose, UA: NEGATIVE mg/dL
Hgb urine dipstick: NEGATIVE
Ketones, ur: NEGATIVE mg/dL
Leukocytes,Ua: NEGATIVE
Nitrite: NEGATIVE
Protein, ur: NEGATIVE mg/dL
Specific Gravity, Urine: 1.012 (ref 1.005–1.030)
pH: 7 (ref 5.0–8.0)

## 2023-01-09 LAB — HEPATIC FUNCTION PANEL
ALT: 16 U/L (ref 0–44)
AST: 16 U/L (ref 15–41)
Albumin: 4.3 g/dL (ref 3.5–5.0)
Alkaline Phosphatase: 124 U/L (ref 38–126)
Bilirubin, Direct: 0.1 mg/dL (ref 0.0–0.2)
Indirect Bilirubin: 0.5 mg/dL (ref 0.3–0.9)
Total Bilirubin: 0.6 mg/dL (ref <1.2)
Total Protein: 7.5 g/dL (ref 6.5–8.1)

## 2023-01-09 LAB — CBC
HCT: 46.4 % (ref 39.0–52.0)
Hemoglobin: 15.3 g/dL (ref 13.0–17.0)
MCH: 31.2 pg (ref 26.0–34.0)
MCHC: 33 g/dL (ref 30.0–36.0)
MCV: 94.5 fL (ref 80.0–100.0)
Platelets: 330 10*3/uL (ref 150–400)
RBC: 4.91 MIL/uL (ref 4.22–5.81)
RDW: 13.4 % (ref 11.5–15.5)
WBC: 14.7 10*3/uL — ABNORMAL HIGH (ref 4.0–10.5)
nRBC: 0 % (ref 0.0–0.2)

## 2023-01-09 LAB — BASIC METABOLIC PANEL
Anion gap: 10 (ref 5–15)
BUN: 11 mg/dL (ref 8–23)
CO2: 29 mmol/L (ref 22–32)
Calcium: 9.5 mg/dL (ref 8.9–10.3)
Chloride: 94 mmol/L — ABNORMAL LOW (ref 98–111)
Creatinine, Ser: 0.81 mg/dL (ref 0.61–1.24)
GFR, Estimated: >60 mL/min (ref >60)
Glucose, Bld: 123 mg/dL — ABNORMAL HIGH (ref 70–99)
Potassium: 5 mmol/L (ref 3.5–5.1)
Sodium: 133 mmol/L — ABNORMAL LOW (ref 135–145)

## 2023-01-09 LAB — LIPASE, BLOOD: Lipase: 28 U/L (ref 11–51)

## 2023-01-09 MED ORDER — IOHEXOL 300 MG/ML  SOLN
100.0000 mL | Freq: Once | INTRAMUSCULAR | Status: AC | PRN
  Administered 2023-01-09: 100 mL via INTRAVENOUS

## 2023-01-09 MED ORDER — OXYCODONE-ACETAMINOPHEN 5-325 MG PO TABS
1.0000 | ORAL_TABLET | Freq: Once | ORAL | Status: AC
  Administered 2023-01-09: 1 via ORAL
  Filled 2023-01-09: qty 1

## 2023-01-09 MED ORDER — NICOTINE 21 MG/24HR TD PT24
21.0000 mg | MEDICATED_PATCH | Freq: Once | TRANSDERMAL | Status: AC
  Administered 2023-01-09: 21 mg via TRANSDERMAL
  Filled 2023-01-09: qty 1

## 2023-01-09 MED ORDER — ONDANSETRON HCL 4 MG/2ML IJ SOLN
4.0000 mg | Freq: Once | INTRAMUSCULAR | Status: AC
  Administered 2023-01-09: 4 mg via INTRAVENOUS
  Filled 2023-01-09: qty 2

## 2023-01-09 MED ORDER — OXYCODONE-ACETAMINOPHEN 5-325 MG PO TABS: 1.0000 | ORAL_TABLET | Freq: Once | ORAL | Status: DC

## 2023-01-09 MED ORDER — MORPHINE SULFATE (PF) 4 MG/ML IV SOLN
6.0000 mg | Freq: Once | INTRAVENOUS | Status: AC
  Administered 2023-01-09: 6 mg via INTRAVENOUS
  Filled 2023-01-09: qty 2

## 2023-01-09 NOTE — ED Notes (Signed)
Patient transported to CT 

## 2023-01-09 NOTE — ED Provider Notes (Signed)
[place] EMERGENCY DEPARTMENT AT [facility] Provider Note   CSN: [id number] Arrival date & time: 01/09/23  1418     History  Chief Complaint  Patient presents with   Back Pain    Aaron Stevens is a 67 y.o. male presenting with acute onset abdominal pain which radiates into the back.  The patient states the pain started around 3 AM last night, and endorses chills over the last day.  He says the pain has gotten progressively worse and radiates into his back and down his spine.  Denies nausea, vomiting, hematemesis, constipation, diarrhea at this time.   Back Pain Associated symptoms: abdominal pain   Associated symptoms: no chest pain        Home Medications Prior to Admission medications   Medication Sig Start Date End Date Taking? Authorizing Provider  acetaminophen (TYLENOL) 500 MG tablet Take 1,000 mg by mouth every 6 (six) hours as needed. Pain    [provider]  albuterol (VENTOLIN HFA) 108 (90 Base) MCG/ACT inhaler Inhale 1-2 puffs into the lungs every 6 (six) hours as needed for wheezing or shortness of breath. 06/09/22   Carlisle Beers, FNP  albuterol (VENTOLIN HFA) 108 (90 Base) MCG/ACT inhaler Inhale 1-2 puffs into the lungs every 6 (six) hours as needed for wheezing or shortness of breath. 10/29/22   Barrett, Horald Chestnut, PA-C  azithromycin (ZITHROMAX) 250 MG tablet Take 1 tablet (250 mg total) by mouth daily. Take first 2 tablets together, then 1 every day until finished. 12/11/21   Tomi Bamberger, PA-C  cephALEXin (KEFLEX) 500 MG capsule Take 1 capsule (500 mg total) by mouth 3 (three) times daily. 07/14/21   Vivi Barrack, DPM  gabapentin (NEURONTIN) 300 MG capsule Take 1 capsule (300 mg total) by mouth 3 (three) times daily for 5 days. 06/09/22 06/14/22  Carlisle Beers, FNP  hydrocortisone cream 1 % Apply to affected area 2 times daily 10/29/22   Barrett, Horald Chestnut, PA-C  Spacer/Aero-Holding Chambers DEVI 1 each by Does not  apply route as needed. 06/13/22   Cathlyn Parsons, NP  tiotropium (SPIRIVA) 18 MCG inhalation capsule Place 1 capsule (18 mcg total) into inhaler and inhale daily. 06/13/22   Cathlyn Parsons, NP  traMADol (ULTRAM) 50 MG tablet Take 50 mg by mouth every 8 (eight) hours as needed for moderate pain.  06/05/18   [provider]      Allergies    Sulfa antibiotics and Bee venom    Review of Systems   Review of Systems  Respiratory:  Negative for shortness of breath.   Cardiovascular:  Negative for chest pain and leg swelling.  Gastrointestinal:  Positive for abdominal pain. Negative for diarrhea, nausea and vomiting.  Musculoskeletal:  Positive for back pain.    Physical Exam Updated Vital Signs BP 126/70 (BP Location: Right Arm)   Pulse 88   Temp 98.2 F (36.8 C) (Oral)   Resp 19   Ht 5\' 6"  (1.676 m)   Wt 55.7 kg   SpO2 95%   BMI 19.84 kg/m  Physical Exam Cardiovascular:     Rate and Rhythm: Normal rate and regular rhythm.     Pulses: Normal pulses.     Heart sounds: Normal heart sounds.  Pulmonary:     Effort: No respiratory distress.     Breath sounds: Wheezing present.  Abdominal:     General: There is no distension.     Tenderness: There is abdominal tenderness.  There is no rebound.  Skin:    General: Skin is warm and dry.  Neurological:     Mental Status: He is alert.     ED Results / Procedures / Treatments   Labs (all labs ordered are listed, but only abnormal results are displayed) Labs Reviewed  BASIC METABOLIC PANEL - Abnormal; Notable for the following components:      Result Value   Sodium 133 (*)    Chloride 94 (*)    Glucose, Bld 123 (*)    All other components within normal limits  CBC - Abnormal; Notable for the following components:   WBC 14.7 (*)    All other components within normal limits  URINALYSIS, ROUTINE W REFLEX MICROSCOPIC - Abnormal; Notable for the following components:   APPearance HAZY (*)    All other components within  normal limits  LIPASE, BLOOD  HEPATIC FUNCTION PANEL    EKG None  Radiology CT ABDOMEN PELVIS W CONTRAST  Result Date: 01/09/2023 CLINICAL DATA:  Acute abdominal pain and nausea, initial encounter EXAM: CT ABDOMEN AND PELVIS WITH CONTRAST TECHNIQUE: Multidetector CT imaging of the abdomen and pelvis was performed using the standard protocol following bolus administration of intravenous contrast. RADIATION DOSE REDUCTION: This exam was performed according to the departmental dose-optimization program which includes automated exposure control, adjustment of the mA and/or kV according to patient size and/or use of iterative reconstruction technique. CONTRAST:  OMNIPAQUE IOHEXOL 300 MG/ML  SOLN COMPARISON:  None Available. FINDINGS: Lower chest: No acute abnormality. Hepatobiliary: Gallbladder is well distended. Some pericholecystic fluid or wall edema is noted. Dilatation of the intrahepatic ductal system is noted. The liver shows scattered small cysts. Common bile duct is mildly prominent although no obstructing stone is seen. Pancreas: Pancreas shows no focal mass. Mild dilatation of the common bile duct is seen Spleen: Normal in size without focal abnormality. Adrenals/Urinary Tract: Adrenal glands are within normal limits. 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 No calculi or obstructive changes are noted. Normal excretion is noted on delayed exam. Bladder is well distended. Stomach/Bowel: The appendix is not visualized consistent with a prior surgical history. No obstructive or inflammatory changes of the colon are seen. Small bowel and stomach are within normal limits. Vascular/Lymphatic: Aortic atherosclerosis. No enlarged abdominal or pelvic lymph nodes. Reproductive: Prostate is mildly prominent. Other: No abdominal wall hernia or abnormality. No abdominopelvic ascites. 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 IMPRESSION: Mildly dilated gallbladder with evidence of pericholecystic fluid/gallbladder wall edema. Intrahepatic biliary ductal dilatation is noted without definitive obstructing lesion. Correlate with laboratory values. Ultrasound may be helpful for further evaluation of gallbladder. 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 No other focal abnormality is noted. Electronically Signed   By: Alcide Clever M.D.   On: 01/09/2023 20:13    Procedures Procedures    Medications Ordered in ED Medications  oxyCODONE-acetaminophen (PERCOCET/ROXICET) 5-325 MG per tablet 1 tablet (1 tablet Oral Given 01/09/23 1835)  iohexol (OMNIPAQUE) 300 MG/ML solution 100 mL (100 mLs Intravenous Contrast Given 01/09/23 1954)    ED Course/ Medical Decision Making/ A&P Clinical Course as of 01/09/23 2221  Tue Jan 09, 2023  1754 CBC(!) [[place]]  1813 Stable  (Resident) Flank and back pain. Subjective fevers and chills.  Broad abdominal pain on exam.  +Guarding  [CC]    Clinical Course User Index [CC] Glyn Ade, MD [[place]] Lovie Macadamia, MD  Medical Decision Making Differential diagnosis would include biliary pathology such as cholangitis, gastroenteritis, peptic ulcer disease, colitis, diverticulitis, abscess, small bowel obstruction, dissection, pancreatitis.  BMP with trivial hyponatremia, hypochloremia, and insignificant hyperglycemia.  CBC demonstrating leukocytosis to 14.7.  Lipase negative. Patient states he had his appendix removed many years ago.  Low concern for cardiac pathology given no shortness of breath, chest pain, hemodynamically stable at this time, though cannot rule out something like abdominal dissection or atypical chest pain.  Will go ahead and give him Percocet 5 mg for his pain, obtain LFTs, EKG, UA, and obtain CT abdomen pelvis with contrast. LFTs unremarkable, and UA bland.  EKG unremarkable.  Less likely cholangitis,  cholelithiasis, or cardiac pain.  CT abdomen pelvis demonstrating mildly dilated gallbladder with evidence of cholecystic fluid/gallbladder wall edema. Intrahepatic biliary duct dilation is noted without definitive obstructing lesion. RUQ showing cholelithiasis with irregular gallbladder wall thickening/edema. May represent acute cholecystitis. Irregular appearance of gallbladder could represent neoplastic process. Patient having increased pain and nausea upon reassessment - will go ahead and give IV morphine, Zofran, and nicotine patch.   Dr. Levora Angel consulted, spoke with him around 2315 and will come assess. Appreciate his help greatly. Orders placed for inpatient admission for further workup and management.   Amount and/or Complexity of Data Reviewed Labs: ordered. Decision-making details documented in ED Course. Radiology: ordered and independent interpretation performed. Decision-making details documented in ED Course. ECG/medicine tests: ordered and independent interpretation performed.    Details: No acute pathology demonstrated on EKG.  Risk OTC drugs. Prescription drug management.           Final Clinical Impression(s) / ED Diagnoses Final diagnoses:  None    Rx / DC Orders ED Discharge Orders          Ordered    US Abdomen Limited RUQ (LIVER/GB)  Status:  Canceled        01/09/23 2030              Lovie Macadamia, MD 01/09/23 2351    Glyn Ade, MD 01/10/23 0003

## 2023-01-09 NOTE — Hospital Course (Signed)
Aaron Stevens is a 67 y.o. male with medical history significant for emphysema, tobacco use, chronic pain syndrome who is admitted with concern for possible choledocholithiasis and acute cholecystitis.

## 2023-01-09 NOTE — ED Provider Triage Note (Signed)
Emergency Medicine Provider Triage Evaluation Note  Jahi Rister , a 67 y.o. male  was evaluated in triage.  Pt complains of epigastric pain.  Review of Systems  Positive: Chills Negative: N/V/D, fever, CP, abdominal pain  Physical Exam  BP 124/62 (BP Location: Left Arm)   Pulse 95   Temp 97.6 F (36.4 C) (Oral)   Resp 16   Ht 5\' 6"  (1.676 m)   Wt 55.7 kg   SpO2 99%   BMI 19.84 kg/m  Gen:   Awake, no distress   Resp:  Normal effort  MSK:   Moves extremities without difficulty  Other:    Medical Decision Making  Medically screening exam initiated at 4:01 PM.  Appropriate orders placed.  [name] was informed that the remainder of the evaluation will be completed by another provider, this initial triage assessment does not replace that evaluation, and the importance of remaining in the ED until their evaluation is complete.  CMP   Dolphus Jenny, PA-C 01/09/23 0102

## 2023-01-09 NOTE — ED Notes (Signed)
9:23 PM  Patient ambulated to restroom.

## 2023-01-09 NOTE — H&P (Signed)
History and Physical    Aaron Stevens ONG:[id number] DOB: 08/29/1955 DOA: 01/09/2023  PCP: Aida Puffer, MD  Patient coming from: Home  I have personally briefly reviewed patient's old medical records in [facility]  Chief Complaint: Abdominal pain  HPI: Aaron Stevens is a 67 y.o. male with medical history significant for emphysema, tobacco use, chronic pain syndrome who presented to the ED for evaluation of back and right upper quadrant pain.  Patient states that he has been having intermittent right upper quadrant abdominal pain for the last 2 weeks.  Pain became very severe earlier today 12/10 which prompted him to come to the ED for further evaluation.  He has had chills but denies nausea, vomiting, diarrhea.  He reports occasional exertional dyspnea.  He is a chronic smoker.  He says he is cut down from 1.5 PPD to about 0.5 PPD now.  He does not have any inhalers to use at home.  He denies any alcohol use.  ED Course  Labs/Imaging on admission: I have personally reviewed following labs and imaging studies.  Initial vitals showed BP 151/79, pulse 95, RR 18, temp 98.1 F, SpO2 99% on room air.  Labs showed WBC 14.7, hemoglobin 15.3, platelets 330,000, sodium 133, potassium 5.0, bicarb 29, BUN 11, creatinine 0.81, serum glucose 123, lipase 28.  LFTs within normal limits.  UA negative for UTI.  CT abdomen/pelvis with contrast showed mildly dilated gallbladder with evidence of pericholecystic fluid/gallbladder wall edema.  Intrahepatic biliary ductal dilatation is noted without definitive obstructing lesion.  Horseshoe kidney noted.  RUQ abdominal ultrasound showed cholelithiasis with irregular gallbladder wall thickening.  Negative sonographic Eulah Pont sign's however appearance is concerning for acute cholecystitis.  Dilated CBD measuring 13 mm reported.  Possible choledocholithiasis with a 7 mm distal CBD stone also reported by radiology.  Patient was given IV  morphine 6 mg, Zofran, Percocet.  EDP discussed with [facility] Dr. Levora Angel, their team will see in consultation.  The hospitalist service was consulted to admit for further evaluation and management.  Review of Systems: All systems reviewed and are negative except as documented in history of present illness above.   Past Medical History:  Diagnosis Date   Atherosclerosis of artery    Emphysema of lung (HCC)    Pneumonia     Past Surgical History:  Procedure Laterality Date   APPENDECTOMY     KNEE SURGERY      Social History:  reports that he has been smoking cigarettes. He has never used smokeless tobacco. He reports that he does not drink alcohol and does not use drugs.  Allergies  Allergen Reactions   Sulfa Antibiotics Anaphylaxis and Other (See Comments)   Bee Venom Other (See Comments)    History reviewed. No pertinent family history.   Prior to Admission medications   Medication Sig Start Date End Date Taking? Authorizing Provider  gabapentin (NEURONTIN) 300 MG capsule Take 300 mg by mouth at bedtime.   Yes [provider]  OVER THE COUNTER MEDICATION Take 1 Bar by mouth every other day. THC Candy   Yes [provider]  albuterol (VENTOLIN HFA) 108 (90 Base) MCG/ACT inhaler Inhale 1-2 puffs into the lungs every 6 (six) hours as needed for wheezing or shortness of breath. Patient not taking: Reported on 01/09/2023 06/09/22   Carlisle Beers, FNP  albuterol (VENTOLIN HFA) 108 (90 Base) MCG/ACT inhaler Inhale 1-2 puffs into the lungs every 6 (six) hours as needed for wheezing or shortness  of breath. 10/29/22   Barrett, Horald Chestnut, PA-C  azithromycin (ZITHROMAX) 250 MG tablet Take 1 tablet (250 mg total) by mouth daily. Take first 2 tablets together, then 1 every day until finished. Patient not taking: Reported on 01/09/2023 12/11/21   Tomi Bamberger, PA-C  cephALEXin (KEFLEX) 500 MG capsule Take 1 capsule (500 mg total) by mouth 3 (three) times  daily. Patient not taking: Reported on 01/09/2023 07/14/21   Vivi Barrack, DPM  gabapentin (NEURONTIN) 300 MG capsule Take 1 capsule (300 mg total) by mouth 3 (three) times daily for 5 days. 06/09/22 06/14/22  Carlisle Beers, FNP  hydrocortisone cream 1 % Apply to affected area 2 times daily Patient not taking: Reported on 01/09/2023 10/29/22   Barrett, Horald Chestnut, PA-C  Spacer/Aero-Holding Chambers DEVI 1 each by Does not apply route as needed. 06/13/22   Cathlyn Parsons, NP  tiotropium (SPIRIVA) 18 MCG inhalation capsule Place 1 capsule (18 mcg total) into inhaler and inhale daily. Patient not taking: Reported on 01/09/2023 06/13/22   Cathlyn Parsons, NP    Physical Exam: Vitals:   01/09/23 2200 01/09/23 2300 01/10/23 0009 01/10/23 0043  BP: (!) 110/49 133/73 119/71 107/66  Pulse: 74 88 90 86  Resp: 18 17 15 18   Temp:   98.5 F (36.9 C) 98.4 F (36.9 C)  TempSrc:   Oral Oral  SpO2: 95% 95% 94% 95%  Weight:      Height:       Constitutional: Resting in bed, NAD, calm, comfortable Eyes: EOMI, lids and conjunctivae normal ENMT: Mucous membranes are moist. Posterior pharynx clear of any exudate or lesions.Normal dentition.  Neck: normal, supple, no masses. Respiratory: Expiratory wheezing throughout the lung fields. Normal respiratory effort. No accessory muscle use.  Cardiovascular: Regular rate and rhythm, no murmurs / rubs / gallops. No extremity edema. 2+ pedal pulses. Abdomen: Mild RUQ tenderness, no masses palpated.  Musculoskeletal: no clubbing / cyanosis. No joint deformity upper and lower extremities. Good ROM, no contractures. Normal muscle tone.  Skin: no rashes, lesions, ulcers. No induration Neurologic:Sensation intact. Strength 5/5 in all 4.  Psychiatric: Normal judgment and insight. Alert and oriented x 3. Normal mood.   EKG: Personally reviewed. Sinus rhythm, rate 91, no acute ischemic changes.  Rate is faster when compared to previous from August  2018.  Assessment/Plan Principal Problem:   Choledocholithiasis with acute cholecystitis Active Problems:   Emphysema of lung (HCC)   Tobacco use   Aaron Stevens is a 67 y.o. male with medical history significant for emphysema, tobacco use, chronic pain syndrome who is admitted with concern for possible choledocholithiasis and acute cholecystitis.  Assessment and Plan: Possible choledocholithiasis with acute cholecystitis: RUQ [place] with possible 7 mm distal CBD stone and appearance of gallbladder concerning for acute cholecystitis.  LFTs are within normal limits.  Has leukocytosis of 14.7. -Obtain MRCP -Start on IV ceftriaxone and Flagyl -Keep n.p.o. and place on maintenance IV fluid hydration -[facility] to follow -Likely will need general surgery involved as well  COPD: Wheezing throughout the lung fields but no current respiratory symptoms.  Ongoing tobacco use.  Not using inhalers as an outpatient. -Placed on scheduled Brovana/Pulmicort for now -DuoNebs as needed -Supplemental O2 as needed  Chronic pain syndrome: Continue home gabapentin.  Tobacco use: Patient reports cutting down from 1.5 PPD to 0.5 PPD.  Nicotine patch provided.   DVT prophylaxis: SCDs Start: 01/10/23 0025 Code Status: Full code, confirmed with patient on admission Family  Communication: Spouse at bedside Disposition Plan: From home and likely discharge to home pending clinical progress Consults called: [facility] Severity of Illness: The appropriate patient status for this patient is INPATIENT. Inpatient status is judged to be reasonable and necessary in order to provide the required intensity of service to ensure the patient's safety. The patient's presenting symptoms, physical exam findings, and initial radiographic and laboratory data in the context of their chronic comorbidities is felt to place them at high risk for further clinical deterioration. Furthermore, it is not anticipated that the patient  will be medically stable for discharge from the hospital within 2 midnights of admission.   * I certify that at the point of admission it is my clinical judgment that the patient will require inpatient hospital care spanning beyond 2 midnights from the point of admission due to high intensity of service, high risk for further deterioration and high frequency of surveillance required.Darreld Mclean MD [facility]  If 7PM-7AM, please contact night-coverage [email or web address]  01/10/2023, 1:14 AM

## 2023-01-09 NOTE — ED Triage Notes (Signed)
C/o mid back pain radiating into abd after eating last night with nausea.  Denies sob/cp

## 2023-01-10 ENCOUNTER — Encounter (HOSPITAL_COMMUNITY): Payer: Self-pay | Admitting: Radiology

## 2023-01-10 ENCOUNTER — Inpatient Hospital Stay (HOSPITAL_COMMUNITY): Payer: Medicare Other

## 2023-01-10 DIAGNOSIS — K8042 Calculus of bile duct with acute cholecystitis without obstruction: Secondary | ICD-10-CM | POA: Diagnosis not present

## 2023-01-10 LAB — CBC
HCT: 42.5 % (ref 39.0–52.0)
Hemoglobin: 13.7 g/dL (ref 13.0–17.0)
MCH: 30.5 pg (ref 26.0–34.0)
MCHC: 32.2 g/dL (ref 30.0–36.0)
MCV: 94.7 fL (ref 80.0–100.0)
Platelets: 302 10*3/uL (ref 150–400)
RBC: 4.49 MIL/uL (ref 4.22–5.81)
RDW: 13.3 % (ref 11.5–15.5)
WBC: 13.9 10*3/uL — ABNORMAL HIGH (ref 4.0–10.5)
nRBC: 0 % (ref 0.0–0.2)

## 2023-01-10 LAB — HIV ANTIBODY (ROUTINE TESTING W REFLEX): HIV Screen 4th Generation wRfx: NONREACTIVE

## 2023-01-10 LAB — COMPREHENSIVE METABOLIC PANEL
ALT: 15 U/L (ref 0–44)
AST: 15 U/L (ref 15–41)
Albumin: 3.6 g/dL (ref 3.5–5.0)
Alkaline Phosphatase: 103 U/L (ref 38–126)
Anion gap: 9 (ref 5–15)
BUN: 10 mg/dL (ref 8–23)
CO2: 24 mmol/L (ref 22–32)
Calcium: 8.7 mg/dL — ABNORMAL LOW (ref 8.9–10.3)
Chloride: 98 mmol/L (ref 98–111)
Creatinine, Ser: 0.75 mg/dL (ref 0.61–1.24)
GFR, Estimated: >60 mL/min (ref >60)
Glucose, Bld: 109 mg/dL — ABNORMAL HIGH (ref 70–99)
Potassium: 4.1 mmol/L (ref 3.5–5.1)
Sodium: 131 mmol/L — ABNORMAL LOW (ref 135–145)
Total Bilirubin: 0.5 mg/dL (ref <1.2)
Total Protein: 6.5 g/dL (ref 6.5–8.1)

## 2023-01-10 MED ORDER — IPRATROPIUM-ALBUTEROL 0.5-2.5 (3) MG/3ML IN SOLN
3.0000 mL | Freq: Four times a day (QID) | RESPIRATORY_TRACT | Status: DC
  Administered 2023-01-10 – 2023-01-13 (×9): 3 mL via RESPIRATORY_TRACT
  Filled 2023-01-10 (×10): qty 3

## 2023-01-10 MED ORDER — GABAPENTIN 100 MG PO CAPS
300.0000 mg | ORAL_CAPSULE | Freq: Three times a day (TID) | ORAL | Status: DC
  Administered 2023-01-10 – 2023-01-13 (×9): 300 mg via ORAL
  Filled 2023-01-10 (×10): qty 3

## 2023-01-10 MED ORDER — METRONIDAZOLE 500 MG/100ML IV SOLN
500.0000 mg | Freq: Two times a day (BID) | INTRAVENOUS | Status: DC
Start: 2023-01-10 — End: 2023-01-13
  Administered 2023-01-10 – 2023-01-13 (×6): 500 mg via INTRAVENOUS
  Filled 2023-01-10 (×6): qty 100

## 2023-01-10 MED ORDER — SENNOSIDES-DOCUSATE SODIUM 8.6-50 MG PO TABS: 1.0000 | ORAL_TABLET | Freq: Every evening | ORAL | Status: DC | PRN

## 2023-01-10 MED ORDER — SODIUM CHLORIDE 0.9 % IV SOLN: INTRAVENOUS | Status: AC

## 2023-01-10 MED ORDER — NICOTINE 21 MG/24HR TD PT24
21.0000 mg | MEDICATED_PATCH | Freq: Every day | TRANSDERMAL | Status: DC
  Administered 2023-01-10 – 2023-01-11 (×2): 21 mg via TRANSDERMAL
  Filled 2023-01-10 (×2): qty 1

## 2023-01-10 MED ORDER — ARFORMOTEROL TARTRATE 15 MCG/2ML IN NEBU
15.0000 ug | INHALATION_SOLUTION | Freq: Two times a day (BID) | RESPIRATORY_TRACT | Status: DC
  Administered 2023-01-10 – 2023-01-13 (×8): 15 ug via RESPIRATORY_TRACT
  Filled 2023-01-10 (×8): qty 2

## 2023-01-10 MED ORDER — GADOBUTROL 1 MMOL/ML IV SOLN
6.0000 mL | Freq: Once | INTRAVENOUS | Status: AC | PRN
  Administered 2023-01-10: 6 mL via INTRAVENOUS

## 2023-01-10 MED ORDER — ACETAMINOPHEN 325 MG PO TABS
650.0000 mg | ORAL_TABLET | Freq: Four times a day (QID) | ORAL | Status: DC | PRN
  Administered 2023-01-10 – 2023-01-13 (×4): 650 mg via ORAL
  Filled 2023-01-10 (×5): qty 2

## 2023-01-10 MED ORDER — GABAPENTIN 100 MG PO CAPS
300.0000 mg | ORAL_CAPSULE | Freq: Every day | ORAL | Status: DC
  Administered 2023-01-10 – 2023-01-13 (×5): 300 mg via ORAL
  Filled 2023-01-10 (×5): qty 3

## 2023-01-10 MED ORDER — MORPHINE SULFATE (PF) 2 MG/ML IV SOLN
1.0000 mg | INTRAVENOUS | Status: AC | PRN
  Administered 2023-01-10 (×3): 1 mg via INTRAVENOUS
  Filled 2023-01-10 (×3): qty 1

## 2023-01-10 MED ORDER — ACETAMINOPHEN 650 MG RE SUPP: 650.0000 mg | Freq: Four times a day (QID) | RECTAL | Status: DC | PRN

## 2023-01-10 MED ORDER — IPRATROPIUM-ALBUTEROL 0.5-2.5 (3) MG/3ML IN SOLN
3.0000 mL | RESPIRATORY_TRACT | Status: DC
  Administered 2023-01-10 (×2): 3 mL via RESPIRATORY_TRACT
  Filled 2023-01-10 (×2): qty 3

## 2023-01-10 MED ORDER — SODIUM CHLORIDE 0.9 % IV SOLN: INTRAVENOUS | Status: DC

## 2023-01-10 MED ORDER — ONDANSETRON HCL 4 MG PO TABS: 4.0000 mg | ORAL_TABLET | Freq: Four times a day (QID) | ORAL | Status: DC | PRN

## 2023-01-10 MED ORDER — SODIUM CHLORIDE 0.9 % IV SOLN
2.0000 g | INTRAVENOUS | Status: DC
  Administered 2023-01-10 – 2023-01-13 (×4): 2 g via INTRAVENOUS
  Filled 2023-01-10 (×4): qty 20

## 2023-01-10 MED ORDER — ALBUTEROL SULFATE (2.5 MG/3ML) 0.083% IN NEBU: 2.5000 mg | INHALATION_SOLUTION | RESPIRATORY_TRACT | Status: DC | PRN

## 2023-01-10 MED ORDER — LACTATED RINGERS IV SOLN: INTRAVENOUS | Status: DC

## 2023-01-10 MED ORDER — IPRATROPIUM-ALBUTEROL 0.5-2.5 (3) MG/3ML IN SOLN
3.0000 mL | Freq: Four times a day (QID) | RESPIRATORY_TRACT | Status: DC | PRN
  Administered 2023-01-10: 3 mL via RESPIRATORY_TRACT
  Filled 2023-01-10: qty 3

## 2023-01-10 MED ORDER — BUDESONIDE 0.25 MG/2ML IN SUSP
0.2500 mg | Freq: Two times a day (BID) | RESPIRATORY_TRACT | Status: DC
  Administered 2023-01-10 – 2023-01-14 (×9): 0.25 mg via RESPIRATORY_TRACT
  Filled 2023-01-10 (×10): qty 2

## 2023-01-10 MED ORDER — ONDANSETRON HCL 4 MG/2ML IJ SOLN: 4.0000 mg | Freq: Four times a day (QID) | INTRAMUSCULAR | Status: DC | PRN

## 2023-01-10 NOTE — Consult Note (Signed)
Referring Provider: Dr. Allena Katz Primary Care Physician:  Aida Puffer, MD Primary Gastroenterologist:  Gentry Fitz  Reason for Consultation:  Abdominal pain  HPI: Aaron Stevens is a 67 y.o. male seen for a consult due to abdominal pain for the past 2 weeks with poor appetite. 5 pound weight loss since onset of symptoms. Denies N/V/D. Denies alcohol. Normal LFTs. MRCP showed mild pancreatic duct dilation and mild intra and extrahepatic duct dilation without definite pancreatic mass.  Past Medical History:  Diagnosis Date   Atherosclerosis of artery    Emphysema of lung (HCC)    Pneumonia     Past Surgical History:  Procedure Laterality Date   APPENDECTOMY     KNEE SURGERY      Prior to Admission medications   Medication Sig Start Date End Date Taking? Authorizing Provider  gabapentin (NEURONTIN) 300 MG capsule Take 300 mg by mouth at bedtime.   Yes [provider]  OVER THE COUNTER MEDICATION Take 1 Bar by mouth every other day. THC Candy   Yes [provider]  albuterol (VENTOLIN HFA) 108 (90 Base) MCG/ACT inhaler Inhale 1-2 puffs into the lungs every 6 (six) hours as needed for wheezing or shortness of breath. Patient not taking: Reported on 01/09/2023 06/09/22   Carlisle Beers, FNP  albuterol (VENTOLIN HFA) 108 (90 Base) MCG/ACT inhaler Inhale 1-2 puffs into the lungs every 6 (six) hours as needed for wheezing or shortness of breath. 10/29/22   Barrett, Horald Chestnut, PA-C  gabapentin (NEURONTIN) 300 MG capsule Take 1 capsule (300 mg total) by mouth 3 (three) times daily for 5 days. 06/09/22 06/14/22  Carlisle Beers, FNP  hydrocortisone cream 1 % Apply to affected area 2 times daily Patient not taking: Reported on 01/09/2023 10/29/22   Barrett, Horald Chestnut, PA-C  Spacer/Aero-Holding Chambers DEVI 1 each by Does not apply route as needed. 06/13/22   Cathlyn Parsons, NP  tiotropium (SPIRIVA) 18 MCG inhalation capsule Place 1 capsule (18 mcg total) into inhaler and  inhale daily. Patient not taking: Reported on 01/09/2023 06/13/22   Cathlyn Parsons, NP    Scheduled Meds:  arformoterol  15 mcg Nebulization BID   budesonide (PULMICORT) nebulizer solution  0.25 mg Nebulization BID   gabapentin  300 mg Oral TID   gabapentin  300 mg Oral QHS   ipratropium-albuterol  3 mL Nebulization Q4H   nicotine  21 mg Transdermal Once   nicotine  21 mg Transdermal Daily   Continuous Infusions:  cefTRIAXone (ROCEPHIN)  IV Stopped (01/10/23 0214)   metronidazole 500 mg (01/10/23 1226)   PRN Meds:.acetaminophen **OR** acetaminophen, albuterol, morphine injection, ondansetron **OR** ondansetron (ZOFRAN) IV, senna-docusate  Allergies as of 01/09/2023 - Review Complete 01/09/2023  Allergen Reaction Noted   Sulfa antibiotics Anaphylaxis and Other (See Comments) 02/25/2012   Bee venom Other (See Comments) 04/26/2015    History reviewed. No pertinent family history.  Social History   Socioeconomic History   Marital status: Married    Spouse name: Not on file   Number of children: Not on file   Years of education: Not on file   Highest education level: Not on file  Occupational History   Not on file  Tobacco Use   Smoking status: Every Day    Current packs/day: 0.50    Types: Cigarettes   Smokeless tobacco: Never  Vaping Use   Vaping status: Never Used  Substance and Sexual Activity   Alcohol use: No   Drug use: No  Sexual activity: Not on file  Other Topics Concern   Not on file  Social History Narrative   ** Merged History Encounter **       Social Determinants of Health   Financial Resource Strain: Not on file  Food Insecurity: No Food Insecurity (01/10/2023)   Hunger Vital Sign    Worried About Running Out of Food in the Last Year: Never true    Ran Out of Food in the Last Year: Never true  Transportation Needs: No Transportation Needs (01/10/2023)   PRAPARE - Administrator, Civil Service (Medical): No    Lack of  Transportation (Non-Medical): No  Physical Activity: Not on file  Stress: Not on file  Social Connections: Unknown (06/10/2021)   Received from [facility], [facility]   Social Network    Social Network: Not on file  Intimate Partner Violence: Not At Risk (01/10/2023)   Humiliation, Afraid, Rape, and Kick questionnaire    Fear of Current or Ex-Partner: No    Emotionally Abused: No    Physically Abused: No    Sexually Abused: No    Review of Systems: All negative except as stated above in HPI.  Physical Exam: Vital signs: Vitals:   01/10/23 1136 01/10/23 1204  BP:  135/63  Pulse:  93  Resp:  18  Temp:  98.5 F (36.9 C)  SpO2: 96% 96%   Last BM Date : 01/09/23 General:   Disheveled, thin, lethargic, no acute distress Head: normocephalic, atraumatic Eyes: anicteric sclera ENT: oropharynx clear Neck: supple, nontender Lungs:  Clear throughout to auscultation.   No wheezes, crackles, or rhonchi. No acute distress. Heart:  Regular rate and rhythm; no murmurs, clicks, rubs,  or gallops. Abdomen: RUQ and epigastric tenderness with guarding, soft, nondistended, +BS   Rectal:  Deferred Ext: no edema  GI:  Lab Results: Recent Labs    01/09/23 1543 01/10/23 0434  WBC 14.7* 13.9*  HGB 15.3 13.7  HCT 46.4 42.5  PLT 330 302   BMET Recent Labs    01/09/23 1543 01/10/23 0434  NA 133* 131*  K 5.0 4.1  CL 94* 98  CO2 29 24  GLUCOSE 123* 109*  BUN 11 10  CREATININE 0.81 0.75  CALCIUM 9.5 8.7*   LFT Recent Labs    01/09/23 1543 01/10/23 0434  PROT 7.5 6.5  ALBUMIN 4.3 3.6  AST 16 15  ALT 16 15  ALKPHOS 124 103  BILITOT 0.6 0.5  BILIDIR 0.1  --   IBILI 0.5  --    PT/INR No results for input(s): "LABPROT", "INR" in the last 72 hours.   Studies/Results: MR ABDOMEN MRCP W WO CONTAST  Result Date: 01/10/2023 CLINICAL DATA:  67 year old male with history of right upper quadrant abdominal pain. Possible choledocholithiasis. EXAM: MRI ABDOMEN WITHOUT AND  WITH CONTRAST (INCLUDING MRCP) TECHNIQUE: Multiplanar multisequence MR imaging of the abdomen was performed both before and after the administration of intravenous contrast. Heavily T2-weighted images of the biliary and pancreatic ducts were obtained, and three-dimensional MRCP images were rendered by post processing. CONTRAST:  6mL GADAVIST GADOBUTROL 1 MMOL/ML IV SOLN COMPARISON:  No prior abdominal MRI. CT of the abdomen and pelvis 01/09/2023. Right upper quadrant abdominal ultrasound 01/09/2023. FINDINGS: Lower chest: Unremarkable. Hepatobiliary: Multiple small T1 hypointense, T2 hyperintense, nonenhancing lesions are scattered throughout the hepatic parenchyma. The largest of these measure up to 1.5 cm in diameter in segment 3 (axial image 11 of series 22). These lesions are compatible with  small cysts and/or biliary hamartomas. No aggressive appearing hepatic lesions are noted. MRCP images demonstrate mild intra and extrahepatic biliary ductal dilatation with the common bile duct measuring up to 8 mm in the porta hepatis. No definite filling defect within the common bile duct to suggest choledocholithiasis. There are numerous filling defects within the gallbladder, compatible with small gallstones. Gallbladder wall appears thickened and edematous. Extensive mural thickening and cystic change is also noted, predominantly in the fundus of the gallbladder, presumably reflective of adenomyomatosis. No definite focal soft tissue enhancement in this region to clearly indicate the presence of a gallbladder mass. Pancreas: No definite pancreatic mass. Mild diffuse dilatation of the main pancreatic duct which measures up to 4 mm. No peripancreatic fluid collections or inflammatory changes. Spleen:  Unremarkable. Adrenals/Urinary Tract: Horseshoe kidney (normal anatomical variant) incidentally noted. No aggressive appearing renal lesions. No hydroureteronephrosis in the visualized portions of the abdomen. Bilateral  adrenal glands are normal in appearance. Stomach/Bowel: Visualized portions are unremarkable. Vascular/Lymphatic: Aortic atherosclerosis, without evidence of aneurysm in the abdominal vasculature. No lymphadenopathy noted in the abdomen. Other: No significant volume of ascites noted in the visualized portions of the peritoneal cavity. Musculoskeletal: No aggressive appearing osseous lesions are noted in the visualized portions of the skeleton. IMPRESSION: 1. 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 Overall, given the lack of sonographic Murphy's sign on recent right upper quadrant abdominal ultrasound, acute cholecystitis is not favored. 2. However, there is mild intra and extrahepatic biliary ductal dilatation, in addition to mild pancreatic ductal dilatation. No obstructing choledocholithiasis noted. No definite pancreatic head mass. However, given the presence of the "double duct sign" (albeit very mild), the possibility of an obstructing ampullary lesion should be considered, and further evaluation with endoscopic ultrasound is recommended if clinically appropriate. 3. Horseshoe kidney (normal anatomical variant) incidentally noted. Electronically Signed   By: Trudie Reed M.D.   On: 01/10/2023 07:23   MR 3D Recon At Scanner  Result Date: 01/10/2023 CLINICAL DATA:  67 year old male with history of right upper quadrant abdominal pain. Possible choledocholithiasis. EXAM: MRI ABDOMEN WITHOUT AND WITH CONTRAST (INCLUDING MRCP) TECHNIQUE: Multiplanar multisequence MR imaging of the abdomen was performed both before and after the administration of intravenous contrast. Heavily T2-weighted images of the biliary and pancreatic ducts were obtained, and three-dimensional MRCP images were rendered by post processing. CONTRAST:  6mL GADAVIST GADOBUTROL 1 MMOL/ML IV SOLN COMPARISON:  No prior abdominal MRI.  CT of the abdomen and pelvis 01/09/2023. Right upper quadrant abdominal ultrasound 01/09/2023. FINDINGS: Lower chest: Unremarkable. Hepatobiliary: Multiple small T1 hypointense, T2 hyperintense, nonenhancing lesions are scattered throughout the hepatic parenchyma. The largest of these measure up to 1.5 cm in diameter in segment 3 (axial image 11 of series 22). These lesions are compatible with small cysts and/or biliary hamartomas. No aggressive appearing hepatic lesions are noted. MRCP images demonstrate mild intra and extrahepatic biliary ductal dilatation with the common bile duct measuring up to 8 mm in the porta hepatis. No definite filling defect within the common bile duct to suggest choledocholithiasis. There are numerous filling defects within the gallbladder, compatible with small gallstones. Gallbladder wall appears thickened and edematous. Extensive mural thickening and cystic change is also noted, predominantly in the fundus of the gallbladder, presumably reflective of adenomyomatosis. No definite focal soft tissue enhancement in this region to clearly indicate the presence of a gallbladder mass. Pancreas: No definite pancreatic mass. Mild diffuse dilatation of  the main pancreatic duct which measures up to 4 mm. No peripancreatic fluid collections or inflammatory changes. Spleen:  Unremarkable. Adrenals/Urinary Tract: Horseshoe kidney (normal anatomical variant) incidentally noted. No aggressive appearing renal lesions. No hydroureteronephrosis in the visualized portions of the abdomen. Bilateral adrenal glands are normal in appearance. Stomach/Bowel: Visualized portions are unremarkable. Vascular/Lymphatic: Aortic atherosclerosis, without evidence of aneurysm in the abdominal vasculature. No lymphadenopathy noted in the abdomen. Other: No significant volume of ascites noted in the visualized portions of the peritoneal cavity. Musculoskeletal: No aggressive appearing osseous lesions are noted in the  visualized portions of the skeleton. IMPRESSION: 1. 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 Overall, given the lack of sonographic Murphy's sign on recent right upper quadrant abdominal ultrasound, acute cholecystitis is not favored. 2. However, there is mild intra and extrahepatic biliary ductal dilatation, in addition to mild pancreatic ductal dilatation. No obstructing choledocholithiasis noted. No definite pancreatic head mass. However, given the presence of the "double duct sign" (albeit very mild), the possibility of an obstructing ampullary lesion should be considered, and further evaluation with endoscopic ultrasound is recommended if clinically appropriate. 3. Horseshoe kidney (normal anatomical variant) incidentally noted. Electronically Signed   By: Trudie Reed M.D.   On: 01/10/2023 07:23   US Abdomen Limited RUQ (LIVER/GB)  Result Date: 01/09/2023 CLINICAL DATA:  Right upper quadrant pain EXAM: ULTRASOUND ABDOMEN LIMITED RIGHT UPPER QUADRANT COMPARISON:  CT abdomen/pelvis dated 01/09/2023 FINDINGS: Gallbladder: Cholelithiasis with irregular gallbladder wall thickening/edema, most prominent in the gallbladder fundus. No gallbladder dilatation. Negative sonographic Murphy's sign. Common bile duct: Diameter: 13 mm.  Possible 7 mm distal CBD stone. Liver: 1.9 cm simple left hepatic lobe cyst, benign. Within normal limits for parenchymal echogenicity. Portal vein is patent on color Doppler imaging with normal direction of blood flow towards the liver. Other: None. IMPRESSION: Cholelithiasis with irregular gallbladder wall thickening/edema. Negative sonographic Murphy's sign. However, this appearance remains concerning for acute cholecystitis. Dilated common bile duct measuring 13 mm. Possible choledocholithiasis with a 7 mm distal CBD stone. Electronically Signed   By: Charline Bills  M.D.   On: 01/09/2023 22:23   CT ABDOMEN PELVIS W CONTRAST  Result Date: 01/09/2023 CLINICAL DATA:  Acute abdominal pain and nausea, initial encounter EXAM: CT ABDOMEN AND PELVIS WITH CONTRAST TECHNIQUE: Multidetector CT imaging of the abdomen and pelvis was performed using the standard protocol following bolus administration of intravenous contrast. RADIATION DOSE REDUCTION: This exam was performed according to the departmental dose-optimization program which includes automated exposure control, adjustment of the mA and/or kV according to patient size and/or use of iterative reconstruction technique. CONTRAST:  OMNIPAQUE IOHEXOL 300 MG/ML  SOLN COMPARISON:  None Available. FINDINGS: Lower chest: No acute abnormality. Hepatobiliary: Gallbladder is well distended. Some pericholecystic fluid or wall edema is noted. Dilatation of the intrahepatic ductal system is noted. The liver shows scattered small cysts. Common bile duct is mildly prominent although no obstructing stone is seen. Pancreas: Pancreas shows no focal mass. Mild dilatation of the common bile duct is seen Spleen: Normal in size without focal abnormality. Adrenals/Urinary Tract: Adrenal glands are within normal limits. 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 No calculi or obstructive changes are noted. Normal excretion is noted on delayed exam. Bladder is well distended. Stomach/Bowel: The appendix is not visualized consistent with a prior surgical history. No obstructive or inflammatory changes of the colon are seen. Small bowel and  stomach are within normal limits. Vascular/Lymphatic: Aortic atherosclerosis. No enlarged abdominal or pelvic lymph nodes. Reproductive: Prostate is mildly prominent. Other: No abdominal wall hernia or abnormality. No abdominopelvic ascites. 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 IMPRESSION: Mildly dilated gallbladder  with evidence of pericholecystic fluid/gallbladder wall edema. Intrahepatic biliary ductal dilatation is noted without definitive obstructing lesion. Correlate with laboratory values. Ultrasound may be helpful for further evaluation of gallbladder. 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 No other focal abnormality is noted. Electronically Signed   By: Alcide Clever M.D.   On: 01/09/2023 20:13    Impression/Plan: Abdominal pain with mildly dilated pancreatic duct and biliary ducts suggesting an ampullary lesion. No pancreatic mass seen. LFTs normal so sign of ongoing biliary obstruction. Agree that EUS is the next step and have discussed with Dr. Dulce Sellar who will do outpt EUS. No urgency to do EUS as inpt at this time. [facility] will arrange outpt EUS for near future. Advance diet as tolerated. Will sign off. Call if questions.    LOS: 1 day   Shirley Friar  01/10/2023, 2:59 PM  Questions please call [phone]

## 2023-01-10 NOTE — Progress Notes (Signed)
[facility] Progress Note Patient: Aaron Stevens ZOX:[id number] DOB: 1955-02-21 DOA: 01/09/2023  DOS: the patient was seen and examined on 01/10/2023  Aaron Stevens is a 67 y.o. male with medical history significant for emphysema, tobacco use, chronic pain syndrome who is admitted with concern for possible choledocholithiasis and acute cholecystitis.   Assessment and plan. Acute cholecystitis with cholelithiasis. Choledocholithiasis ruled out. RUQ [place] with possible 7 mm distal CBD stone and appearance of gallbladder concerning for acute cholecystitis.  LFTs are within normal limits.  Has leukocytosis of 14.7. MRCP is negative for significant cholecystitis.  Will get HIDA scan. Choledocholithiasis ruled out as well. Cholelithiasis seen. Concern for ampullary lesion.  GI was consulted recommend outpatient EUS. Currently on IV antibiotic. Will advance diet and monitor. Likely will need general surgery involved as well   COPD: Wheezing throughout the lung fields but no current respiratory symptoms.  Ongoing tobacco use.  Not using inhalers as an outpatient. -Placed on scheduled Brovana/Pulmicort for now -DuoNebs as needed -Supplemental O2 as needed   Chronic pain syndrome: Continue home gabapentin.   Tobacco use: Patient reports cutting down from 1.5 PPD to 0.5 PPD.  Nicotine patch provided.  Subjective: Abdominal pain still present but improving.  No nausea no vomiting.  Passing gas.  Physical Exam: General: in Mild distress, No Rash Cardiovascular: S1 and S2 Present, No Murmur Respiratory: Good respiratory effort, Bilateral Air entry present. No Crackles, bilateral wheezes Abdomen: Bowel Sound present, RUQ tenderness Extremities: No edema Neuro: Alert and oriented x3, no new focal deficit  Data Reviewed: I have Reviewed nursing notes, Vitals, and Lab results. Since last encounter, pertinent lab results CBC and BMP   . I have ordered test including CBC and BMP   . I have discussed pt's care plan and test results with GI  .  Ordered HIDA scan  Disposition: Status is: Inpatient Remains inpatient appropriate because: Monitor for improvement in abdominal pain.  SCDs Start: 01/10/23 0025   Family Communication: No one at bedside Level of care: Med-Surg   Vitals:   01/10/23 1136 01/10/23 1204 01/10/23 1550 01/10/23 1635  BP:  135/63  126/63  Pulse:  93  (!) 102  Resp:  18  18  Temp:  98.5 F (36.9 C)    TempSrc:  Oral    SpO2: 96% 96% 93% 92%  Weight:      Height:         Author: Lynden Oxford, MD 01/10/2023 7:41 PM  Please look on [email or web address] to find out who is on call.

## 2023-01-10 NOTE — ED Notes (Signed)
ED TO INPATIENT HANDOFF REPORT  ED Nurse Name and Phone #: Reatha Harps Name/Age/Gender Aaron Stevens 67 y.o. male Room/Bed: WA18/WA18  Code Status   Code Status: Not on file  Home/SNF/Other Home Patient oriented to: self, place, time, and situation Is this baseline? Yes   Triage Complete: Triage complete  Chief Complaint Choledocholithiasis with acute cholecystitis [K80.42]  Triage Note C/o mid back pain radiating into abd after eating last night with nausea.  Denies sob/cp    Allergies Allergies  Allergen Reactions   Sulfa Antibiotics Anaphylaxis and Other (See Comments)   Bee Venom Other (See Comments)    Level of Care/Admitting Diagnosis ED Disposition     ED Disposition  Admit   Condition  --   Comment  Hospital Area: [facility] [[id number]]  Level of Care: Med-Surg [16]  May admit patient to [facility] or [facility] if equivalent level of care is available:: No  Covid Evaluation: Asymptomatic - no recent exposure (last 10 days) testing not required  Diagnosis: Choledocholithiasis with acute cholecystitis [657846]  Admitting Physician: Charlsie Quest [[id number]]  Attending Physician: Charlsie Quest [[id number]]  Certification:: I certify this patient will need inpatient services for at least 2 midnights  Expected Medical Readiness: 01/11/2023          B Medical/Surgery History Past Medical History:  Diagnosis Date   Atherosclerosis of artery    Emphysema of lung (HCC)    Pneumonia    Past Surgical History:  Procedure Laterality Date   APPENDECTOMY     KNEE SURGERY       A IV Location/Drains/Wounds Patient Lines/Drains/Airways Status     Active Line/Drains/Airways     Name Placement date Placement time Site Days   Peripheral IV 01/09/23 20 G Left Antecubital 01/09/23  1938  Antecubital  1            Intake/Output Last 24 hours No intake or output data in the 24 hours ending 01/10/23  0004  Labs/Imaging Results for orders placed or performed during the hospital encounter of 01/09/23 (from the past 48 hour(s))  Basic metabolic panel     Status: Abnormal   Collection Time: 01/09/23  3:43 PM  Result Value Ref Range   Sodium 133 (L) 135 - 145 mmol/L   Potassium 5.0 3.5 - 5.1 mmol/L   Chloride 94 (L) 98 - 111 mmol/L   CO2 29 22 - 32 mmol/L   Glucose, Bld 123 (H) 70 - 99 mg/dL    Comment: Glucose reference range applies only to samples taken after fasting for at least 8 hours.   BUN 11 8 - 23 mg/dL   Creatinine, Ser 1.02 0.61 - 1.24 mg/dL   Calcium 9.5 8.9 - 72.5 mg/dL   GFR, Estimated >36 >64 mL/min    Comment: (NOTE) Calculated using the CKD-EPI Creatinine Equation (2021)    Anion gap 10 5 - 15    Comment: Performed at [facility], [address], [place], [place] [address]  CBC     Status: Abnormal   Collection Time: 01/09/23  3:43 PM  Result Value Ref Range   WBC 14.7 (H) 4.0 - 10.5 K/uL   RBC 4.91 4.22 - 5.81 MIL/uL   Hemoglobin 15.3 13.0 - 17.0 g/dL   HCT 42.5 95.6 - 38.7 %   MCV 94.5 80.0 - 100.0 fL   MCH 31.2 26.0 - 34.0 pg   MCHC 33.0 30.0 - 36.0 g/dL  RDW 13.4 11.5 - 15.5 %   Platelets 330 150 - 400 K/uL   nRBC 0.0 0.0 - 0.2 %    Comment: Performed at [facility], [address], [place], [place] [address]  Lipase, blood     Status: None   Collection Time: 01/09/23  3:43 PM  Result Value Ref Range   Lipase 28 11 - 51 U/L    Comment: Performed at [facility], [address], [place], [place] [address]  Hepatic function panel     Status: None   Collection Time: 01/09/23  3:43 PM  Result Value Ref Range   Total Protein 7.5 6.5 - 8.1 g/dL   Albumin 4.3 3.5 - 5.0 g/dL   AST 16 15 - 41 U/L   ALT 16 0 - 44 U/L   Alkaline Phosphatase 124 38 - 126 U/L   Total Bilirubin 0.6 <1.2 mg/dL   Bilirubin, Direct 0.1 0.0 - 0.2 mg/dL   Indirect Bilirubin 0.5 0.3 - 0.9 mg/dL    Comment: Performed at  [facility], [address], [place], [place] [address]  Urinalysis, Routine w reflex microscopic -Urine, Clean Catch     Status: Abnormal   Collection Time: 01/09/23  7:00 PM  Result Value Ref Range   Color, Urine YELLOW YELLOW   APPearance HAZY (A) CLEAR   Specific Gravity, Urine 1.012 1.005 - 1.030   pH 7.0 5.0 - 8.0   Glucose, UA NEGATIVE NEGATIVE mg/dL   Hgb urine dipstick NEGATIVE NEGATIVE   Bilirubin Urine NEGATIVE NEGATIVE   Ketones, ur NEGATIVE NEGATIVE mg/dL   Protein, ur NEGATIVE NEGATIVE mg/dL   Nitrite NEGATIVE NEGATIVE   Leukocytes,Ua NEGATIVE NEGATIVE    Comment: Performed at [facility], [address] [place], [place] [address]   US Abdomen Limited RUQ (LIVER/GB)  Result Date: 01/09/2023 CLINICAL DATA:  Right upper quadrant pain EXAM: ULTRASOUND ABDOMEN LIMITED RIGHT UPPER QUADRANT COMPARISON:  CT abdomen/pelvis dated 01/09/2023 FINDINGS: Gallbladder: Cholelithiasis with irregular gallbladder wall thickening/edema, most prominent in the gallbladder fundus. No gallbladder dilatation. Negative sonographic Murphy's sign. Common bile duct: Diameter: 13 mm.  Possible 7 mm distal CBD stone. Liver: 1.9 cm simple left hepatic lobe cyst, benign. Within normal limits for parenchymal echogenicity. Portal vein is patent on color Doppler imaging with normal direction of blood flow towards the liver. Other: None. IMPRESSION: Cholelithiasis with irregular gallbladder wall thickening/edema. Negative sonographic Murphy's sign. However, this appearance remains concerning for acute cholecystitis. Dilated common bile duct measuring 13 mm. Possible choledocholithiasis with a 7 mm distal CBD stone. Electronically Signed   By: Charline Bills M.D.   On: 01/09/2023 22:23   CT ABDOMEN PELVIS W CONTRAST  Result Date: 01/09/2023 CLINICAL DATA:  Acute abdominal pain and nausea, initial encounter EXAM: CT ABDOMEN AND PELVIS WITH CONTRAST TECHNIQUE:  Multidetector CT imaging of the abdomen and pelvis was performed using the standard protocol following bolus administration of intravenous contrast. RADIATION DOSE REDUCTION: This exam was performed according to the departmental dose-optimization program which includes automated exposure control, adjustment of the mA and/or kV according to patient size and/or use of iterative reconstruction technique. CONTRAST:  OMNIPAQUE IOHEXOL 300 MG/ML  SOLN COMPARISON:  None Available. FINDINGS: Lower chest: No acute abnormality. Hepatobiliary: Gallbladder is well distended. Some pericholecystic fluid or wall edema is noted. Dilatation of the intrahepatic ductal system is noted. The liver shows scattered small cysts. Common bile duct is mildly prominent although no obstructing stone is  seen. Pancreas: Pancreas shows no focal mass. Mild dilatation of the common bile duct is seen Spleen: Normal in size without focal abnormality. Adrenals/Urinary Tract: Adrenal glands are within normal limits. 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 No calculi or obstructive changes are noted. Normal excretion is noted on delayed exam. Bladder is well distended. Stomach/Bowel: The appendix is not visualized consistent with a prior surgical history. No obstructive or inflammatory changes of the colon are seen. Small bowel and stomach are within normal limits. Vascular/Lymphatic: Aortic atherosclerosis. No enlarged abdominal or pelvic lymph nodes. Reproductive: Prostate is mildly prominent. Other: No abdominal wall hernia or abnormality. No abdominopelvic ascites. 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 IMPRESSION: Mildly dilated gallbladder with evidence of pericholecystic fluid/gallbladder wall edema. Intrahepatic biliary ductal dilatation is noted without definitive obstructing lesion. Correlate with laboratory values. Ultrasound may be helpful  for further evaluation of gallbladder. 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 No other focal abnormality is noted. Electronically Signed   By: Alcide Clever M.D.   On: 01/09/2023 20:13    Pending Labs Unresulted Labs (From admission, onward)    None       Vitals/Pain Today's Vitals   01/09/23 1754 01/09/23 1845 01/09/23 1930 01/09/23 2051  BP: (!) 151/79 126/71 122/75 126/70  Pulse: 95 89 85 88  Resp: 18 17 20 19   Temp: 98.1 F (36.7 C)   98.2 F (36.8 C)  TempSrc: Oral   Oral  SpO2: 99% 99% 99% 95%  Weight:      Height:      PainSc:        Isolation Precautions No active isolations  Medications Medications  nicotine (NICODERM CQ - dosed in mg/24 hours) patch 21 mg (21 mg Transdermal Patch Applied 01/09/23 2332)  oxyCODONE-acetaminophen (PERCOCET/ROXICET) 5-325 MG per tablet 1 tablet (1 tablet Oral Given 01/09/23 1835)  iohexol (OMNIPAQUE) 300 MG/ML solution 100 mL (100 mLs Intravenous Contrast Given 01/09/23 1954)  morphine (PF) 4 MG/ML injection 6 mg (6 mg Intravenous Given 01/09/23 2332)  ondansetron (ZOFRAN) injection 4 mg (4 mg Intravenous Given 01/09/23 2332)    Mobility walks     Focused Assessments Cardiac Assessment Handoff:  Cardiac Rhythm: Normal sinus rhythm No results found for: "CKTOTAL", "CKMB", "CKMBINDEX", "TROPONINI" No results found for: "DDIMER" Does the Patient currently have chest pain? No    R Recommendations: See Admitting Provider Note  Report given to:   Additional Notes:

## 2023-01-11 ENCOUNTER — Inpatient Hospital Stay (HOSPITAL_COMMUNITY): Payer: Medicare Other

## 2023-01-11 ENCOUNTER — Other Ambulatory Visit: Payer: Self-pay | Admitting: Gastroenterology

## 2023-01-11 ENCOUNTER — Encounter (HOSPITAL_COMMUNITY): Payer: Self-pay | Admitting: Internal Medicine

## 2023-01-11 DIAGNOSIS — K8042 Calculus of bile duct with acute cholecystitis without obstruction: Secondary | ICD-10-CM | POA: Diagnosis not present

## 2023-01-11 LAB — COMPREHENSIVE METABOLIC PANEL
ALT: 11 U/L (ref 0–44)
AST: 14 U/L — ABNORMAL LOW (ref 15–41)
Albumin: 3 g/dL — ABNORMAL LOW (ref 3.5–5.0)
Alkaline Phosphatase: 90 U/L (ref 38–126)
Anion gap: 9 (ref 5–15)
BUN: 9 mg/dL (ref 8–23)
CO2: 22 mmol/L (ref 22–32)
Calcium: 8.1 mg/dL — ABNORMAL LOW (ref 8.9–10.3)
Chloride: 102 mmol/L (ref 98–111)
Creatinine, Ser: 0.76 mg/dL (ref 0.61–1.24)
GFR, Estimated: >60 mL/min (ref >60)
Glucose, Bld: 104 mg/dL — ABNORMAL HIGH (ref 70–99)
Potassium: 4 mmol/L (ref 3.5–5.1)
Sodium: 133 mmol/L — ABNORMAL LOW (ref 135–145)
Total Bilirubin: 0.5 mg/dL (ref <1.2)
Total Protein: 5.6 g/dL — ABNORMAL LOW (ref 6.5–8.1)

## 2023-01-11 LAB — CBC WITH DIFFERENTIAL/PLATELET
Abs Immature Granulocytes: 0.05 10*3/uL (ref 0.00–0.07)
Basophils Absolute: 0 10*3/uL (ref 0.0–0.1)
Basophils Relative: 0 %
Eosinophils Absolute: 0 10*3/uL (ref 0.0–0.5)
Eosinophils Relative: 0 %
HCT: 38.6 % — ABNORMAL LOW (ref 39.0–52.0)
Hemoglobin: 12.6 g/dL — ABNORMAL LOW (ref 13.0–17.0)
Immature Granulocytes: 0 %
Lymphocytes Relative: 16 %
Lymphs Abs: 1.9 10*3/uL (ref 0.7–4.0)
MCH: 31.1 pg (ref 26.0–34.0)
MCHC: 32.6 g/dL (ref 30.0–36.0)
MCV: 95.3 fL (ref 80.0–100.0)
Monocytes Absolute: 1.1 10*3/uL — ABNORMAL HIGH (ref 0.1–1.0)
Monocytes Relative: 9 %
Neutro Abs: 9.1 10*3/uL — ABNORMAL HIGH (ref 1.7–7.7)
Neutrophils Relative %: 75 %
Platelets: 222 10*3/uL (ref 150–400)
RBC: 4.05 MIL/uL — ABNORMAL LOW (ref 4.22–5.81)
RDW: 13.7 % (ref 11.5–15.5)
WBC: 12.2 10*3/uL — ABNORMAL HIGH (ref 4.0–10.5)
nRBC: 0 % (ref 0.0–0.2)

## 2023-01-11 LAB — MAGNESIUM: Magnesium: 1.9 mg/dL (ref 1.7–2.4)

## 2023-01-11 MED ORDER — MORPHINE SULFATE (PF) 2 MG/ML IV SOLN
INTRAVENOUS | Status: AC
  Filled 2023-01-11: qty 2

## 2023-01-11 MED ORDER — NICOTINE 21 MG/24HR TD PT24
21.0000 mg | MEDICATED_PATCH | Freq: Every day | TRANSDERMAL | Status: DC
  Administered 2023-01-11 – 2023-01-14 (×4): 21 mg via TRANSDERMAL
  Filled 2023-01-11 (×4): qty 1

## 2023-01-11 MED ORDER — MORPHINE SULFATE (PF) 4 MG/ML IV SOLN
2.5000 mg | Freq: Once | INTRAVENOUS | Status: AC
  Administered 2023-01-11: 2.5 mg via INTRAVENOUS

## 2023-01-11 MED ORDER — LACTATED RINGERS IV SOLN: INTRAVENOUS | Status: DC

## 2023-01-11 MED ORDER — TECHNETIUM TC 99M MEBROFENIN IV KIT
5.4000 | PACK | Freq: Once | INTRAVENOUS | Status: AC
  Administered 2023-01-11: 5.4 via INTRAVENOUS

## 2023-01-11 NOTE — Progress Notes (Signed)
Pt confirmed not having anything to eat or drink since midnight. Pt is scheduled for nuclear med study today.

## 2023-01-11 NOTE — Consult Note (Signed)
Consult Note  Aaron Stevens [birth year]  [id number].    Requesting MD: Dr. Allena Katz Chief Complaint/Reason for Consult: cholelithiasis, abdominal pain  HPI:  67 y.o. male with medical history significant for emphysema, atherosclerosis, chronic pain syndrome who presented to [facility] ED on 12/10 with acute onset abdominal pain radiating into his back. Pain began at night and progressively worsened prompting presentation to ED. Pain radiated to is back. He had associated chills. He denied n/v, constipation, diarrhea, chills. He has has similar symptoms for the last month and notes they have progressively worsened.  Work up in ED significant for leukocytosis and cholelithiasis with gallbladder wall thickening and Intrahepatic biliary ductal dilatation  GI following. General surgery asked to see regarding abdominal pain and cholelithiasis  At time of my exam abdominal pain is somewhat improved, however he still reports pain with PO intake.  Substance use: current every day cigarette smoker Allergies: sulfa abx - anaphylaxis Blood thinners: none Past Surgeries: appendectomy   ROS: Reviewed and as above  History reviewed. No pertinent family history.  Past Medical History:  Diagnosis Date   Atherosclerosis of artery    Emphysema of lung (HCC)    Pneumonia     Past Surgical History:  Procedure Laterality Date   APPENDECTOMY     KNEE SURGERY      Social History:  reports that he has been smoking cigarettes. He has never used smokeless tobacco. He reports that he does not drink alcohol and does not use drugs.  Allergies:  Allergies  Allergen Reactions   Sulfa Antibiotics Anaphylaxis and Other (See Comments)   Bee Venom Other (See Comments)    Medications Prior to Admission  Medication Sig Dispense Refill   albuterol (VENTOLIN HFA) 108 (90 Base) MCG/ACT inhaler Inhale 1-2 puffs into the lungs every 6 (six) hours as needed for wheezing or shortness of breath. 1 each 0    gabapentin (NEURONTIN) 300 MG capsule Take 300 mg by mouth at bedtime.     OVER THE COUNTER MEDICATION Take 1 Bar by mouth every other day. THC Candy     Spacer/Aero-Holding Chambers DEVI 1 each by Does not apply route as needed. 1 each 0   albuterol (VENTOLIN HFA) 108 (90 Base) MCG/ACT inhaler Inhale 1-2 puffs into the lungs every 6 (six) hours as needed for wheezing or shortness of breath. (Patient not taking: Reported on 01/09/2023) 8 g 0   gabapentin (NEURONTIN) 300 MG capsule Take 1 capsule (300 mg total) by mouth 3 (three) times daily for 5 days. 15 capsule 0   hydrocortisone cream 1 % Apply to affected area 2 times daily (Patient not taking: Reported on 01/09/2023) 1.5 g 0   tiotropium (SPIRIVA) 18 MCG inhalation capsule Place 1 capsule (18 mcg total) into inhaler and inhale daily. (Patient not taking: Reported on 01/09/2023) 30 capsule 1    Blood pressure 115/60, pulse (!) 108, temperature 99.5 F (37.5 C), temperature source Oral, resp. rate 16, height 5\' 6"  (1.676 m), weight 55.7 kg, SpO2 91%. Physical Exam: General: pleasant, WD, cachectic male who is laying in bed in NAD HEENT: head is normocephalic, atraumatic.  Sclera are anicteric.  Pupils equal and round. EOMs intact.  Ears and nose without any masses or lesions.  Mouth is pink and moist Heart: regular, rate, and rhythm.  Normal s1,s2. No obvious murmurs, gallops, or rubs noted.  Palpable radial and pedal pulses bilaterally Lungs: CTAB, no wheezes, rhonchi, or rales noted.  Respiratory effort nonlabored  Abd: soft, ttp in RUQ, ND, +BS, no masses, hernias, or organomegaly MSK: all 4 extremities are symmetrical with no cyanosis, clubbing, or edema. Skin: warm and dry with no masses, lesions, or rashes Neuro: Cranial nerves 2-12 grossly intact, sensation is normal throughout Psych: A&Ox3 with an appropriate affect.    Results for orders placed or performed during the hospital encounter of 01/09/23 (from the past 48 hours)  Basic  metabolic panel     Status: Abnormal   Collection Time: 01/09/23  3:43 PM  Result Value Ref Range   Sodium 133 (L) 135 - 145 mmol/L   Potassium 5.0 3.5 - 5.1 mmol/L   Chloride 94 (L) 98 - 111 mmol/L   CO2 29 22 - 32 mmol/L   Glucose, Bld 123 (H) 70 - 99 mg/dL    Comment: Glucose reference range applies only to samples taken after fasting for at least 8 hours.   BUN 11 8 - 23 mg/dL   Creatinine, Ser 3.08 0.61 - 1.24 mg/dL   Calcium 9.5 8.9 - 65.7 mg/dL   GFR, Estimated >84 >69 mL/min    Comment: (NOTE) Calculated using the CKD-EPI Creatinine Equation (2021)    Anion gap 10 5 - 15    Comment: Performed at [facility], [address] [place], [place] [address]  CBC     Status: Abnormal   Collection Time: 01/09/23  3:43 PM  Result Value Ref Range   WBC 14.7 (H) 4.0 - 10.5 K/uL   RBC 4.91 4.22 - 5.81 MIL/uL   Hemoglobin 15.3 13.0 - 17.0 g/dL   HCT 84.1 32.4 - 40.1 %   MCV 94.5 80.0 - 100.0 fL   MCH 31.2 26.0 - 34.0 pg   MCHC 33.0 30.0 - 36.0 g/dL   RDW 02.7 25.3 - 66.4 %   Platelets 330 150 - 400 K/uL   nRBC 0.0 0.0 - 0.2 %    Comment: Performed at [facility], [address], [place], [place] [address]  Lipase, blood     Status: None   Collection Time: 01/09/23  3:43 PM  Result Value Ref Range   Lipase 28 11 - 51 U/L    Comment: Performed at [facility], [address] [place], [place] [address]  Hepatic function panel     Status: None   Collection Time: 01/09/23  3:43 PM  Result Value Ref Range   Total Protein 7.5 6.5 - 8.1 g/dL   Albumin 4.3 3.5 - 5.0 g/dL   AST 16 15 - 41 U/L   ALT 16 0 - 44 U/L   Alkaline Phosphatase 124 38 - 126 U/L   Total Bilirubin 0.6 <1.2 mg/dL   Bilirubin, Direct 0.1 0.0 - 0.2 mg/dL   Indirect Bilirubin 0.5 0.3 - 0.9 mg/dL    Comment: Performed at [facility], [address], [place], [place] [address]  Urinalysis, Routine w reflex microscopic -Urine, Clean Catch      Status: Abnormal   Collection Time: 01/09/23  7:00 PM  Result Value Ref Range   Color, Urine YELLOW YELLOW   APPearance HAZY (A) CLEAR   Specific Gravity, Urine 1.012 1.005 - 1.030   pH 7.0 5.0 - 8.0   Glucose, UA NEGATIVE NEGATIVE mg/dL   Hgb urine dipstick NEGATIVE NEGATIVE   Bilirubin Urine NEGATIVE NEGATIVE   Ketones, ur NEGATIVE NEGATIVE mg/dL   Protein, ur NEGATIVE NEGATIVE mg/dL   Nitrite NEGATIVE NEGATIVE   Leukocytes,Ua NEGATIVE  NEGATIVE    Comment: Performed at [facility], [address] [place], [place] [address]  HIV Antibody (routine testing w rflx)     Status: None   Collection Time: 01/10/23  4:34 AM  Result Value Ref Range   HIV Screen 4th Generation wRfx Non Reactive Non Reactive    Comment: Performed at [facility], [address], [place], [place] [address]  Comprehensive metabolic panel     Status: Abnormal   Collection Time: 01/10/23  4:34 AM  Result Value Ref Range   Sodium 131 (L) 135 - 145 mmol/L   Potassium 4.1 3.5 - 5.1 mmol/L   Chloride 98 98 - 111 mmol/L   CO2 24 22 - 32 mmol/L   Glucose, Bld 109 (H) 70 - 99 mg/dL    Comment: Glucose reference range applies only to samples taken after fasting for at least 8 hours.   BUN 10 8 - 23 mg/dL   Creatinine, Ser 2.95 0.61 - 1.24 mg/dL   Calcium 8.7 (L) 8.9 - 10.3 mg/dL   Total Protein 6.5 6.5 - 8.1 g/dL   Albumin 3.6 3.5 - 5.0 g/dL   AST 15 15 - 41 U/L   ALT 15 0 - 44 U/L   Alkaline Phosphatase 103 38 - 126 U/L   Total Bilirubin 0.5 <1.2 mg/dL   GFR, Estimated >62 >13 mL/min    Comment: (NOTE) Calculated using the CKD-EPI Creatinine Equation (2021)    Anion gap 9 5 - 15    Comment: Performed at [facility], [address], [place], [place] [address]  CBC     Status: Abnormal   Collection Time: 01/10/23  4:34 AM  Result Value Ref Range   WBC 13.9 (H) 4.0 - 10.5 K/uL   RBC 4.49 4.22 - 5.81 MIL/uL   Hemoglobin 13.7 13.0 - 17.0 g/dL   HCT 84.6 96.2 -  95.2 %   MCV 94.7 80.0 - 100.0 fL   MCH 30.5 26.0 - 34.0 pg   MCHC 32.2 30.0 - 36.0 g/dL   RDW 84.1 32.4 - 40.1 %   Platelets 302 150 - 400 K/uL   nRBC 0.0 0.0 - 0.2 %    Comment: Performed at [facility], [address], [place], [place] [address]  CBC with Differential/Platelet     Status: Abnormal   Collection Time: 01/11/23  4:53 AM  Result Value Ref Range   WBC 12.2 (H) 4.0 - 10.5 K/uL   RBC 4.05 (L) 4.22 - 5.81 MIL/uL   Hemoglobin 12.6 (L) 13.0 - 17.0 g/dL   HCT 36.6 (L) 44.0 - 34.7 %   MCV 95.3 80.0 - 100.0 fL   MCH 31.1 26.0 - 34.0 pg   MCHC 32.6 30.0 - 36.0 g/dL   RDW 42.5 95.6 - 38.7 %   Platelets 222 150 - 400 K/uL   nRBC 0.0 0.0 - 0.2 %   Neutrophils Relative % 75 %   Neutro Abs 9.1 (H) 1.7 - 7.7 K/uL   Lymphocytes Relative 16 %   Lymphs Abs 1.9 0.7 - 4.0 K/uL   Monocytes Relative 9 %   Monocytes Absolute 1.1 (H) 0.1 - 1.0 K/uL   Eosinophils Relative 0 %   Eosinophils Absolute 0.0 0.0 - 0.5 K/uL   Basophils Relative 0 %   Basophils Absolute 0.0 0.0 - 0.1 K/uL   Immature Granulocytes 0 %   Abs Immature Granulocytes 0.05 0.00 - 0.07 K/uL  Comment: Performed at [facility], [address], [place], [place] [address]  Comprehensive metabolic panel     Status: Abnormal   Collection Time: 01/11/23  4:53 AM  Result Value Ref Range   Sodium 133 (L) 135 - 145 mmol/L   Potassium 4.0 3.5 - 5.1 mmol/L   Chloride 102 98 - 111 mmol/L   CO2 22 22 - 32 mmol/L   Glucose, Bld 104 (H) 70 - 99 mg/dL    Comment: Glucose reference range applies only to samples taken after fasting for at least 8 hours.   BUN 9 8 - 23 mg/dL   Creatinine, Ser 9.62 0.61 - 1.24 mg/dL   Calcium 8.1 (L) 8.9 - 10.3 mg/dL   Total Protein 5.6 (L) 6.5 - 8.1 g/dL   Albumin 3.0 (L) 3.5 - 5.0 g/dL   AST 14 (L) 15 - 41 U/L   ALT 11 0 - 44 U/L   Alkaline Phosphatase 90 38 - 126 U/L   Total Bilirubin 0.5 <1.2 mg/dL   GFR, Estimated >95 >28 mL/min    Comment:  (NOTE) Calculated using the CKD-EPI Creatinine Equation (2021)    Anion gap 9 5 - 15    Comment: Performed at [facility], [address], [place], [place] [address]  Magnesium     Status: None   Collection Time: 01/11/23  4:53 AM  Result Value Ref Range   Magnesium 1.9 1.7 - 2.4 mg/dL    Comment: Performed at [facility], [address] [place], [place] [address]   MR ABDOMEN MRCP W WO CONTAST Result Date: 01/10/2023 CLINICAL DATA:  67 year old male with history of right upper quadrant abdominal pain. Possible choledocholithiasis. EXAM: MRI ABDOMEN WITHOUT AND WITH CONTRAST (INCLUDING MRCP) TECHNIQUE: Multiplanar multisequence MR imaging of the abdomen was performed both before and after the administration of intravenous contrast. Heavily T2-weighted images of the biliary and pancreatic ducts were obtained, and three-dimensional MRCP images were rendered by post processing. CONTRAST:  6mL GADAVIST GADOBUTROL 1 MMOL/ML IV SOLN COMPARISON:  No prior abdominal MRI. CT of the abdomen and pelvis 01/09/2023. Right upper quadrant abdominal ultrasound 01/09/2023. FINDINGS: Lower chest: Unremarkable. Hepatobiliary: Multiple small T1 hypointense, T2 hyperintense, nonenhancing lesions are scattered throughout the hepatic parenchyma. The largest of these measure up to 1.5 cm in diameter in segment 3 (axial image 11 of series 22). These lesions are compatible with small cysts and/or biliary hamartomas. No aggressive appearing hepatic lesions are noted. MRCP images demonstrate mild intra and extrahepatic biliary ductal dilatation with the common bile duct measuring up to 8 mm in the porta hepatis. No definite filling defect within the common bile duct to suggest choledocholithiasis. There are numerous filling defects within the gallbladder, compatible with small gallstones. Gallbladder wall appears thickened and edematous. Extensive mural thickening and cystic change is also  noted, predominantly in the fundus of the gallbladder, presumably reflective of adenomyomatosis. No definite focal soft tissue enhancement in this region to clearly indicate the presence of a gallbladder mass. Pancreas: No definite pancreatic mass. Mild diffuse dilatation of the main pancreatic duct which measures up to 4 mm. No peripancreatic fluid collections or inflammatory changes. Spleen:  Unremarkable. Adrenals/Urinary Tract: Horseshoe kidney (normal anatomical variant) incidentally noted. No aggressive appearing renal lesions. No hydroureteronephrosis in the visualized portions of the abdomen. Bilateral adrenal glands are normal in appearance. Stomach/Bowel: Visualized portions are unremarkable. Vascular/Lymphatic: Aortic atherosclerosis, without evidence of aneurysm in the abdominal vasculature. No lymphadenopathy noted in the  abdomen. Other: No significant volume of ascites noted in the visualized portions of the peritoneal cavity. Musculoskeletal: No aggressive appearing osseous lesions are noted in the visualized portions of the skeleton. IMPRESSION: 1. 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 Overall, given the lack of sonographic Murphy's sign on recent right upper quadrant abdominal ultrasound, acute cholecystitis is not favored. 2. However, there is mild intra and extrahepatic biliary ductal dilatation, in addition to mild pancreatic ductal dilatation. No obstructing choledocholithiasis noted. No definite pancreatic head mass. However, given the presence of the "double duct sign" (albeit very mild), the possibility of an obstructing ampullary lesion should be considered, and further evaluation with endoscopic ultrasound is recommended if clinically appropriate. 3. Horseshoe kidney (normal anatomical variant) incidentally noted. Electronically Signed   By: Trudie Reed M.D.   On: 01/10/2023  07:23   MR 3D Recon At Scanner Result Date: 01/10/2023 CLINICAL DATA:  67 year old male with history of right upper quadrant abdominal pain. Possible choledocholithiasis. EXAM: MRI ABDOMEN WITHOUT AND WITH CONTRAST (INCLUDING MRCP) TECHNIQUE: Multiplanar multisequence MR imaging of the abdomen was performed both before and after the administration of intravenous contrast. Heavily T2-weighted images of the biliary and pancreatic ducts were obtained, and three-dimensional MRCP images were rendered by post processing. CONTRAST:  6mL GADAVIST GADOBUTROL 1 MMOL/ML IV SOLN COMPARISON:  No prior abdominal MRI. CT of the abdomen and pelvis 01/09/2023. Right upper quadrant abdominal ultrasound 01/09/2023. FINDINGS: Lower chest: Unremarkable. Hepatobiliary: Multiple small T1 hypointense, T2 hyperintense, nonenhancing lesions are scattered throughout the hepatic parenchyma. The largest of these measure up to 1.5 cm in diameter in segment 3 (axial image 11 of series 22). These lesions are compatible with small cysts and/or biliary hamartomas. No aggressive appearing hepatic lesions are noted. MRCP images demonstrate mild intra and extrahepatic biliary ductal dilatation with the common bile duct measuring up to 8 mm in the porta hepatis. No definite filling defect within the common bile duct to suggest choledocholithiasis. There are numerous filling defects within the gallbladder, compatible with small gallstones. Gallbladder wall appears thickened and edematous. Extensive mural thickening and cystic change is also noted, predominantly in the fundus of the gallbladder, presumably reflective of adenomyomatosis. No definite focal soft tissue enhancement in this region to clearly indicate the presence of a gallbladder mass. Pancreas: No definite pancreatic mass. Mild diffuse dilatation of the main pancreatic duct which measures up to 4 mm. No peripancreatic fluid collections or inflammatory changes. Spleen:  Unremarkable.  Adrenals/Urinary Tract: Horseshoe kidney (normal anatomical variant) incidentally noted. No aggressive appearing renal lesions. No hydroureteronephrosis in the visualized portions of the abdomen. Bilateral adrenal glands are normal in appearance. Stomach/Bowel: Visualized portions are unremarkable. Vascular/Lymphatic: Aortic atherosclerosis, without evidence of aneurysm in the abdominal vasculature. No lymphadenopathy noted in the abdomen. Other: No significant volume of ascites noted in the visualized portions of the peritoneal cavity. Musculoskeletal: No aggressive appearing osseous lesions are noted in the visualized portions of the skeleton. IMPRESSION: 1. 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 Overall, given the lack of sonographic Murphy's sign on recent right upper quadrant abdominal ultrasound, acute cholecystitis is not favored. 2. However, there is mild intra and extrahepatic biliary ductal dilatation, in addition to mild pancreatic ductal dilatation. No obstructing choledocholithiasis noted. No definite pancreatic head mass. However, given the presence of the "double duct sign" (albeit very mild), the possibility  of an obstructing ampullary lesion should be considered, and further evaluation with endoscopic ultrasound is recommended if clinically appropriate. 3. Horseshoe kidney (normal anatomical variant) incidentally noted. Electronically Signed   By: Trudie Reed M.D.   On: 01/10/2023 07:23   US Abdomen Limited RUQ (LIVER/GB) Result Date: 01/09/2023 CLINICAL DATA:  Right upper quadrant pain EXAM: ULTRASOUND ABDOMEN LIMITED RIGHT UPPER QUADRANT COMPARISON:  CT abdomen/pelvis dated 01/09/2023 FINDINGS: Gallbladder: Cholelithiasis with irregular gallbladder wall thickening/edema, most prominent in the gallbladder fundus. No gallbladder dilatation. Negative sonographic Murphy's sign.  Common bile duct: Diameter: 13 mm.  Possible 7 mm distal CBD stone. Liver: 1.9 cm simple left hepatic lobe cyst, benign. Within normal limits for parenchymal echogenicity. Portal vein is patent on color Doppler imaging with normal direction of blood flow towards the liver. Other: None. IMPRESSION: Cholelithiasis with irregular gallbladder wall thickening/edema. Negative sonographic Murphy's sign. However, this appearance remains concerning for acute cholecystitis. Dilated common bile duct measuring 13 mm. Possible choledocholithiasis with a 7 mm distal CBD stone. Electronically Signed   By: Charline Bills M.D.   On: 01/09/2023 22:23   CT ABDOMEN PELVIS W CONTRAST Result Date: 01/09/2023 CLINICAL DATA:  Acute abdominal pain and nausea, initial encounter EXAM: CT ABDOMEN AND PELVIS WITH CONTRAST TECHNIQUE: Multidetector CT imaging of the abdomen and pelvis was performed using the standard protocol following bolus administration of intravenous contrast. RADIATION DOSE REDUCTION: This exam was performed according to the departmental dose-optimization program which includes automated exposure control, adjustment of the mA and/or kV according to patient size and/or use of iterative reconstruction technique. CONTRAST:  OMNIPAQUE IOHEXOL 300 MG/ML  SOLN COMPARISON:  None Available. FINDINGS: Lower chest: No acute abnormality. Hepatobiliary: Gallbladder is well distended. Some pericholecystic fluid or wall edema is noted. Dilatation of the intrahepatic ductal system is noted. The liver shows scattered small cysts. Common bile duct is mildly prominent although no obstructing stone is seen. Pancreas: Pancreas shows no focal mass. Mild dilatation of the common bile duct is seen Spleen: Normal in size without focal abnormality. Adrenals/Urinary Tract: Adrenal glands are within normal limits. 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 No calculi or obstructive changes are noted.  Normal excretion is noted on delayed exam. Bladder is well distended. Stomach/Bowel: The appendix is not visualized consistent with a prior surgical history. No obstructive or inflammatory changes of the colon are seen. Small bowel and stomach are within normal limits. Vascular/Lymphatic: Aortic atherosclerosis. No enlarged abdominal or pelvic lymph nodes. Reproductive: Prostate is mildly prominent. Other: No abdominal wall hernia or abnormality. No abdominopelvic ascites. 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 IMPRESSION: Mildly dilated gallbladder with evidence of pericholecystic fluid/gallbladder wall edema. Intrahepatic biliary ductal dilatation is noted without definitive obstructing lesion. Correlate with laboratory values. Ultrasound may be helpful for further evaluation of gallbladder. 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 No other focal abnormality is noted. Electronically Signed   By: Alcide Clever M.D.   On: 01/09/2023 20:13      Assessment/Plan Cholelithiasis RUQ abdominal pain Possible ampullary lesion  Patient seen and examined and relevant labs and imaging personally reviewed. Multiple imaging studies done (CT, [place], MRCP) with findings of cholelithiasis, dilated GB with pericholecystic fluid, GB wall thickening, intra and extra hepatic biliary ductal dilatation as well as mild pancreatic ductal dilatation, NO CBD stone but question of obstructing ampullary lesion. LFTs and T bili overall WNL. He does have  RUQ abdominal pain.  GI following  and looks like planning for outpatient EUS for further evaluation of ampullary lesion. HIDA ordered per [facility] and agree with plan - will follow for results. If positive, in light of possible ampullary lesion would not plan cholecystectomy until that is further evaluated and would recommend antibiotics +/- percutaneous cholecystotomy.  FEN: CLD ID: [id number] VTE: okay for chemical prophylaxis  from surgical standpoint  I reviewed Consultant GI notes, hospitalist notes, last 24 h vitals and pain scores, last 48 h intake and output, last 24 h labs and trends, and last 24 h imaging results.   Trixie Deis, [facility] Surgery 01/11/2023, 11:12 AM Please see Amion for pager number during day hours 7:00am-4:30pm

## 2023-01-11 NOTE — Progress Notes (Signed)
Mobility Specialist - Progress Note   01/11/23 0919  Mobility  Activity Ambulated independently in hallway  Level of Assistance Independent  Assistive Device None  Distance Ambulated (ft) 500 ft  Activity Response Tolerated well  Mobility Referral Yes  Mobility visit 1 Mobility  Mobility Specialist Start Time (ACUTE ONLY) 0914  Mobility Specialist Stop Time (ACUTE ONLY) [id number]  Mobility Specialist Time Calculation (min) (ACUTE ONLY) 4 min   Pt received in bed and agreeable to mobility. No complaints during session. Pt to bed after session with all needs met.    [facility]

## 2023-01-11 NOTE — Progress Notes (Signed)
[facility] Progress Note Patient: Aaron Stevens ZOX:[id number] DOB: 02/19/1955 DOA: 01/09/2023  DOS: the patient was seen and examined on 01/11/2023  Aaron Stevens is a 67 y.o. male with medical history significant for emphysema, tobacco use, chronic pain syndrome who is admitted with concern for possible choledocholithiasis and acute cholecystitis.   Assessment and plan. Acute cholecystitis with cholelithiasis. Choledocholithiasis ruled out. RUQ [place] with possible 7 mm distal CBD stone and appearance of gallbladder concerning for acute cholecystitis.  LFTs are within normal limits.  Has leukocytosis of 14.7. MRCP is negative for significant cholecystitis.  Choledocholithiasis ruled out as well. Cholelithiasis seen. Concern for ampullary lesion.  GI was consulted recommend outpatient EUS. Currently on IV antibiotic. General surgery was consulted after HIDA scan being positive. Current recommendation is for GI to consider patient for inpatient EUS.  Highly appreciate GI assistance.  N.p.o. after midnight for potential EUS on 12/13.   COPD: Wheezing throughout the lung fields but no current respiratory symptoms.  Ongoing tobacco use.  Not using inhalers as an outpatient. -Placed on scheduled Brovana/Pulmicort for now unable to use systemic steroids. -DuoNebs as needed -Supplemental O2 as needed   Chronic pain syndrome: Continue home gabapentin.   Tobacco use: Patient reports cutting down from 1.5 PPD to 0.5 PPD.  Nicotine patch provided.  Subjective: Abdominal pain improving.  No nausea no vomiting.  Has some cough and shortness of breath.  Physical Exam: Moderate distress No rash. Abdomen present. Bilateral expiratory wheezing present.  No respiratory distress seen.  Data Reviewed: I have Reviewed nursing notes, Vitals, and Lab results. Reviewed CBC and CMP.  Reordered CBC and CMP.  Discussed with GI as well as general surgery.  Disposition: Status is:  Inpatient Remains inpatient appropriate because: Monitor for improvement in abdominal pain.  SCDs Start: 01/10/23 0025   Family Communication: Wife at bedside Level of care: Med-Surg   Vitals:   01/11/23 0539 01/11/23 0739 01/11/23 1148 01/11/23 1535  BP: 115/60     Pulse: (!) 108     Resp: 16     Temp: 99.5 F (37.5 C)     TempSrc: Oral     SpO2: 92% 91% 95% 93%  Weight:      Height:         Author: Lynden Oxford, MD 01/11/2023 7:05 PM  Please look on [email or web address] to find out who is on call.

## 2023-01-12 ENCOUNTER — Inpatient Hospital Stay (HOSPITAL_COMMUNITY): Payer: Medicare Other

## 2023-01-12 ENCOUNTER — Encounter (HOSPITAL_COMMUNITY): Payer: Self-pay | Admitting: Internal Medicine

## 2023-01-12 DIAGNOSIS — K8042 Calculus of bile duct with acute cholecystitis without obstruction: Secondary | ICD-10-CM | POA: Diagnosis not present

## 2023-01-12 HISTORY — PX: IR PERC CHOLECYSTOSTOMY: IMG2326

## 2023-01-12 LAB — CBC WITH DIFFERENTIAL/PLATELET
Abs Immature Granulocytes: 0.05 10*3/uL (ref 0.00–0.07)
Basophils Absolute: 0 10*3/uL (ref 0.0–0.1)
Basophils Relative: 0 %
Eosinophils Absolute: 0.1 10*3/uL (ref 0.0–0.5)
Eosinophils Relative: 1 %
HCT: 38.4 % — ABNORMAL LOW (ref 39.0–52.0)
Hemoglobin: 12.4 g/dL — ABNORMAL LOW (ref 13.0–17.0)
Immature Granulocytes: 0 %
Lymphocytes Relative: 11 %
Lymphs Abs: 1.5 10*3/uL (ref 0.7–4.0)
MCH: 30.3 pg (ref 26.0–34.0)
MCHC: 32.3 g/dL (ref 30.0–36.0)
MCV: 93.9 fL (ref 80.0–100.0)
Monocytes Absolute: 1 10*3/uL (ref 0.1–1.0)
Monocytes Relative: 8 %
Neutro Abs: 10.2 10*3/uL — ABNORMAL HIGH (ref 1.7–7.7)
Neutrophils Relative %: 80 %
Platelets: 227 10*3/uL (ref 150–400)
RBC: 4.09 MIL/uL — ABNORMAL LOW (ref 4.22–5.81)
RDW: 13.8 % (ref 11.5–15.5)
WBC: 12.8 10*3/uL — ABNORMAL HIGH (ref 4.0–10.5)
nRBC: 0 % (ref 0.0–0.2)

## 2023-01-12 LAB — COMPREHENSIVE METABOLIC PANEL
ALT: 12 U/L (ref 0–44)
AST: 15 U/L (ref 15–41)
Albumin: 2.9 g/dL — ABNORMAL LOW (ref 3.5–5.0)
Alkaline Phosphatase: 86 U/L (ref 38–126)
Anion gap: 7 (ref 5–15)
BUN: 10 mg/dL (ref 8–23)
CO2: 25 mmol/L (ref 22–32)
Calcium: 8.2 mg/dL — ABNORMAL LOW (ref 8.9–10.3)
Chloride: 102 mmol/L (ref 98–111)
Creatinine, Ser: 0.72 mg/dL (ref 0.61–1.24)
GFR, Estimated: >60 mL/min (ref >60)
Glucose, Bld: 109 mg/dL — ABNORMAL HIGH (ref 70–99)
Potassium: 3.8 mmol/L (ref 3.5–5.1)
Sodium: 134 mmol/L — ABNORMAL LOW (ref 135–145)
Total Bilirubin: 0.4 mg/dL (ref <1.2)
Total Protein: 5.6 g/dL — ABNORMAL LOW (ref 6.5–8.1)

## 2023-01-12 LAB — MAGNESIUM: Magnesium: 2 mg/dL (ref 1.7–2.4)

## 2023-01-12 MED ORDER — LIDOCAINE-EPINEPHRINE 1 %-1:100000 IJ SOLN
20.0000 mL | Freq: Once | INTRAMUSCULAR | Status: AC
  Administered 2023-01-12: 5 mL via INTRADERMAL

## 2023-01-12 MED ORDER — HYDROCODONE-ACETAMINOPHEN 5-325 MG PO TABS
1.0000 | ORAL_TABLET | ORAL | Status: DC | PRN
  Administered 2023-01-12: 1 via ORAL
  Filled 2023-01-12: qty 1

## 2023-01-12 MED ORDER — MIDAZOLAM HCL 2 MG/2ML IJ SOLN
INTRAMUSCULAR | Status: AC
  Filled 2023-01-12: qty 2

## 2023-01-12 MED ORDER — MIDAZOLAM HCL 2 MG/2ML IJ SOLN
INTRAMUSCULAR | Status: AC | PRN
  Administered 2023-01-12 (×2): 1 mg via INTRAVENOUS

## 2023-01-12 MED ORDER — SENNOSIDES-DOCUSATE SODIUM 8.6-50 MG PO TABS
2.0000 | ORAL_TABLET | Freq: Two times a day (BID) | ORAL | Status: DC
Start: 2023-01-12 — End: 2023-01-14
  Administered 2023-01-12 – 2023-01-14 (×3): 2 via ORAL
  Filled 2023-01-12 (×4): qty 2

## 2023-01-12 MED ORDER — FENTANYL CITRATE (PF) 100 MCG/2ML IJ SOLN
INTRAMUSCULAR | Status: AC | PRN
  Administered 2023-01-12: 50 ug via INTRAVENOUS

## 2023-01-12 MED ORDER — FENTANYL CITRATE (PF) 100 MCG/2ML IJ SOLN
INTRAMUSCULAR | Status: AC
  Filled 2023-01-12: qty 2

## 2023-01-12 MED ORDER — SODIUM CHLORIDE 0.9% FLUSH
5.0000 mL | Freq: Three times a day (TID) | INTRAVENOUS | Status: DC
  Administered 2023-01-12 – 2023-01-14 (×6): 5 mL

## 2023-01-12 MED ORDER — IOHEXOL 300 MG/ML  SOLN
50.0000 mL | Freq: Once | INTRAMUSCULAR | Status: AC | PRN
  Administered 2023-01-12: 15 mL

## 2023-01-12 MED ORDER — MORPHINE SULFATE (PF) 2 MG/ML IV SOLN
2.0000 mg | Freq: Once | INTRAVENOUS | Status: AC
  Administered 2023-01-12: 2 mg via INTRAVENOUS
  Filled 2023-01-12: qty 1

## 2023-01-12 MED ORDER — LIDOCAINE-EPINEPHRINE 1 %-1:100000 IJ SOLN
INTRAMUSCULAR | Status: AC
  Filled 2023-01-12: qty 1

## 2023-01-12 MED ORDER — OXYCODONE HCL 5 MG PO TABS
5.0000 mg | ORAL_TABLET | ORAL | Status: DC | PRN
  Administered 2023-01-12 – 2023-01-14 (×7): 5 mg via ORAL
  Filled 2023-01-12 (×7): qty 1

## 2023-01-12 NOTE — Plan of Care (Signed)

## 2023-01-12 NOTE — Progress Notes (Signed)
Mobility Specialist - Progress Note   01/12/23 0951  Mobility  Activity Ambulated independently in hallway  Level of Assistance Independent  Assistive Device None  Distance Ambulated (ft) 480 ft  Range of Motion/Exercises Active  Activity Response Tolerated well  Mobility Referral Yes  Mobility visit 1 Mobility  Mobility Specialist Start Time (ACUTE ONLY) 0940  Mobility Specialist Stop Time (ACUTE ONLY) 0951  Mobility Specialist Time Calculation (min) (ACUTE ONLY) 11 min   Pt was found on recliner chair and agreeable to ambulate. Per family having R sided pain. At [facility] beginning rated his pain 5/10. At EOS returned to bed with all needs met. Call bell in reach and family in room.  Billey Chang Mobility Specialist

## 2023-01-12 NOTE — Progress Notes (Signed)
Progress Note     Subjective: Still having significant RUQ pain.  Wife at bedside Objective: Vital signs in last 24 hours: Temp:  [98.1 F (36.7 C)-98.6 F (37 C)] 98.1 F (36.7 C) (12/13 0503) Pulse Rate:  [93-107] 93 (12/13 0503) Resp:  [18] 18 (12/13 0503) BP: (108-118)/(54-55) 108/55 (12/13 0503) SpO2:  [93 %-96 %] 94 % (12/13 0856) Last BM Date : 01/09/23  Intake/Output from previous day: 12/12 0701 - 12/13 0700 In: 960.3 [P.O.:240; I.V.:520.3; IV Piggyback:200] Out: 350 [Urine:350] Intake/Output this shift: No intake/output data recorded.  PE: General: pleasant, WD, male who is laying in bed in NAD Heart: regular, rate, and rhythm Lungs: Respiratory effort nonlabored Abd: soft, significant RUQ TTP with guarding MSK: all 4 extremities are symmetrical with no cyanosis, clubbing, or edema. Skin: warm and dry Psych: A&Ox3 with an appropriate affect.    Lab Results:  Recent Labs    01/11/23 0453 01/12/23 0432  WBC 12.2* 12.8*  HGB 12.6* 12.4*  HCT 38.6* 38.4*  PLT 222 227   BMET Recent Labs    01/11/23 0453 01/12/23 0432  NA 133* 134*  K 4.0 3.8  CL 102 102  CO2 22 25  GLUCOSE 104* 109*  BUN 9 10  CREATININE 0.76 0.72  CALCIUM 8.1* 8.2*   PT/INR No results for input(s): "LABPROT", "INR" in the last 72 hours. CMP     Component Value Date/Time   NA 134 (L) 01/12/2023 0432   K 3.8 01/12/2023 0432   CL 102 01/12/2023 0432   CO2 25 01/12/2023 0432   GLUCOSE 109 (H) 01/12/2023 0432   BUN 10 01/12/2023 0432   CREATININE 0.72 01/12/2023 0432   CALCIUM 8.2 (L) 01/12/2023 0432   PROT 5.6 (L) 01/12/2023 0432   ALBUMIN 2.9 (L) 01/12/2023 0432   AST 15 01/12/2023 0432   ALT 12 01/12/2023 0432   ALKPHOS 86 01/12/2023 0432   BILITOT 0.4 01/12/2023 0432   GFRNONAA >60 01/12/2023 0432   GFRAA >60 06/11/2018 2016   Lipase     Component Value Date/Time   LIPASE 28 01/09/2023 1543       Studies/Results: DG Chest 2 View Result Date:  01/11/2023 CLINICAL DATA:  Short of breath, emphysema EXAM: CHEST - 2 VIEW COMPARISON:  12/11/2021 FINDINGS: Frontal and lateral views of the chest are obtained on 3 images. The cardiac silhouette is stable. Continued hyperinflation and emphysema. No acute airspace disease, effusion, or pneumothorax. No acute bony abnormalities. IMPRESSION: 1. Stable emphysema.  No acute process. Electronically Signed   By: Sharlet Salina M.D.   On: 01/11/2023 20:33   NM Hepatobiliary Liver Func Result Date: 01/11/2023 CLINICAL DATA:  Concern for cholecystitis. EXAM: NUCLEAR MEDICINE HEPATOBILIARY IMAGING TECHNIQUE: Sequential images of the abdomen were obtained out to 60 minutes following intravenous administration of radiopharmaceutical. An additional 30 minutes of scintigraphic imaging was obtained post intravenous injection of 2.5 mg of morphine. RADIOPHARMACEUTICALS:  5.4 mCi Tc-37m  Choletec IV COMPARISON:  MRI January 10, 2023. FINDINGS: Prompt uptake and biliary excretion of activity by the liver is seen. Subtle rim of increased radiotracer activity along the gallbladder fossa. Gallbladder activity is not identified pre or post morphine administration. Biliary activity passes into small bowel, consistent with patent common bile duct. IMPRESSION: Scintigraphic findings compatible with acute cholecystitis. These results will be called to the ordering clinician or representative by the Radiologist Assistant, and communication documented in the PACS or [employer]. Electronically Signed   By: Christell Constant.D.  On: 01/11/2023 15:02    Anti-infectives: Anti-infectives (From admission, onward)    Start     Dose/Rate Route Frequency Ordered Stop   01/10/23 0030  cefTRIAXone (ROCEPHIN) 2 g in sodium chloride 0.9 % 100 mL IVPB        2 g 200 mL/hr over 30 Minutes Intravenous Every 24 hours 01/10/23 0026     01/10/23 0030  metroNIDAZOLE (FLAGYL) IVPB 500 mg        500 mg 100 mL/hr over 60 Minutes Intravenous  Every 12 hours 01/10/23 0026          Assessment/Plan Cholecystitis with cholelithiasis RUQ abdominal pain Possible ampullary lesion   - HIDA positive - still with significant RUQ pain. Given degree of pain and timeline of symptoms without improvement despite bowel rest/abx gallbladder likely with severe inflammatory changes and think percutaneous cholecystotomy favorable to laparoscopic cholecystectomy acutely. Would plan for interval lap chole in 6-8 weeks pending GI work up and findings regarding possible ampullary lesion  FEN: NPO ID: [email or web address] VTE: okay for LMWH or subcutaneous heparin from surgical standpoint. Hold for IR eval  I reviewed hospitalist notes, last 24 h vitals and pain scores, last 48 h intake and output, last 24 h labs and trends, and last 24 h imaging results.    LOS: 3 days   Eric Form, [facility] 01/12/2023, 10:33 AM Please see Amion for pager number during day hours 7:00am-4:30pm

## 2023-01-12 NOTE — TOC CM/SW Note (Signed)
Transition of Care [facility]) - Inpatient Brief Assessment   Patient Details  Name: Galvin Mccallie MRN: [id number] Date of Birth: [birth year]  [facility] CM/SW Contact:    Darleene Cleaver, LCSW Phone Number: 01/12/2023, 5:24 PM   Clinical Narrative:  Patient does not have any SDOH needs, he has insurance, and a PCP.  No anticipated TOC needs.   Transition of Care Asessment: Insurance and Status: Insurance coverage has been reviewed Patient has primary care physician: Yes Home environment has been reviewed: Yes Prior level of function:: Indep Prior/Current Home Services: No current home services Social Drivers of Health Review: SDOH reviewed no interventions necessary Readmission risk has been reviewed: Yes Transition of care needs: no transition of care needs at this time

## 2023-01-12 NOTE — Procedures (Signed)
  Procedure:  US/fluoro guided cholecystostomy catheter placement 83f Preprocedure diagnosis: The primary encounter diagnosis was RUQ pain. A diagnosis of Generalized abdominal pain was also pertinent to this visit. Postprocedure diagnosis: same EBL:    minimal Complications:   none immediate  See full dictation in [employer].  Thora Lance MD Main # [phone] Pager  [phone] Mobile [phone]

## 2023-01-12 NOTE — Progress Notes (Signed)
[facility] Progress Note Patient: Aaron Stevens WJX:[id number] DOB: 05-29-1955 DOA: 01/09/2023  DOS: the patient was seen and examined on 01/12/2023  Aaron Stevens is a 67 y.o. male with medical history significant for emphysema, tobacco use, chronic pain syndrome who is admitted with concern for possible choledocholithiasis and acute cholecystitis.  GI and general surgery were consulted.  As well as IR. Assessment and plan. Acute cholecystitis with cholelithiasis. Choledocholithiasis ruled out. RUQ [place] with possible 7 mm distal CBD stone and appearance of gallbladder concerning for acute cholecystitis.  LFTs are within normal limits.  MRCP is negative for choledocholithiasis. Reported to have concern for ampullary lesion given dilated ducts. HIDA scan was positive for acute cholecystitis. GI and general surgery both were consulted. GI recommended outpatient EUS initially. General surgery initially recommended direct lap chole but later on due to his abdominal pain recommended to place a PERC drain. Cultures are currently sent from the drain. Underwent drain placement on 12/13. GI recommending outpatient EUS again. Will advance diet and monitor. Continue IV antibiotic for now.   Acute exacerbation of COPD: Continues to have wheezing since admission. Has been on antibiotics since admission. Given his abdominal infection unable to use IV or systemic steroids. Will continue with budesonide, Brovana, and DuoNebs. Cough supplements as well.  Active smoker. Patient smokes half a pack a day. Currently has nicotine patch. Would recommend nicotine gum as well. Recommend to quit smoking.   Chronic pain syndrome: Continue home gabapentin.   Subjective: Abdominal pain improving.  No nausea no vomiting.  Has some cough and shortness of breath.  Physical Exam: Moderate distress No rash. Abdomen present. Bilateral expiratory wheezing present.  No respiratory distress  seen.  Data Reviewed: I have Reviewed nursing notes, Vitals, and Lab results. Reviewed CBC and CMP.  Reordered CBC and CMP.  Discussed with GI as well as general surgery.  Disposition: Status is: Inpatient Remains inpatient appropriate because: Monitor for improvement in abdominal pain.  SCDs Start: 01/10/23 0025   Family Communication: Wife at bedside Level of care: Med-Surg   Vitals:   01/12/23 1340 01/12/23 1412 01/12/23 1423 01/12/23 1812  BP: (!) 140/70 131/69 128/68 136/67  Pulse: 98 96 97 96  Resp: (!) 24 16 14 16   Temp:  98 F (36.7 C) 98.6 F (37 C) 98.5 F (36.9 C)  TempSrc:  Oral Oral Oral  SpO2: 96% 93% 95% 93%  Weight:      Height:         Author: Lynden Oxford, MD 01/12/2023 7:04 PM  Please look on [email or web address] to find out who is on call.

## 2023-01-12 NOTE — Consult Note (Signed)
Chief Complaint: Acute cholecystitis. Request is for cholecystostomy tube placement  Referring Physician(s): Trixie Deis PA  Supervising Physician: Oley Balm  Patient Status: [facility] - In-pt  History of Present Illness: Aaron Stevens is a 67 y.o. male 67 y.o. male, Smoker. history of emphysema. Presented to the ED at [facility] on 12.10.24 with abdominal pain that radiated to his back. Found to have leukocytosis and cholelithiasis. HIDA from 12.12.24 reads cintigraphic findings compatible with acute cholecystitis. Team is concerned for ampullary lesion (double duct sign) seen on MRI from 12.11.24. GI is following and plans outpatient EUS for further evaluation. Due to this possible ampullary lesion patient is not deemed a surgical candidate. Team is requesting a cholecystostomy tube placement   Patient alert and laying in bed,calm. Endorses right flank pain. Denies any fevers, headache, chest pain, SOB, cough, nausea, vomiting or bleeding.   WBC is 12.8. No cultures. afebrile. Allergic to sulfa. Patient has been NPO since midnight.   Return precautions and treatment recommendations and follow-up discussed with the patient and his wife at bedside. Both who are agreeable with the plan.   Past Medical History:  Diagnosis Date   Atherosclerosis of artery    Emphysema of lung (HCC)    Pneumonia     Past Surgical History:  Procedure Laterality Date   APPENDECTOMY     KNEE SURGERY      Allergies: Sulfa antibiotics and Bee venom  Medications: Prior to Admission medications   Medication Sig Start Date End Date Taking? Authorizing Provider  albuterol (VENTOLIN HFA) 108 (90 Base) MCG/ACT inhaler Inhale 1-2 puffs into the lungs every 6 (six) hours as needed for wheezing or shortness of breath. 10/29/22  Yes Barrett, Jamie N, PA-C  gabapentin (NEURONTIN) 300 MG capsule Take 300 mg by mouth at bedtime.   Yes [provider]  OVER THE COUNTER MEDICATION Take 1 Bar by  mouth every other day. THC Candy   Yes [provider]  Spacer/Aero-Holding Chambers DEVI 1 each by Does not apply route as needed. 06/13/22  Yes Cathlyn Parsons, NP  albuterol (VENTOLIN HFA) 108 (90 Base) MCG/ACT inhaler Inhale 1-2 puffs into the lungs every 6 (six) hours as needed for wheezing or shortness of breath. Patient not taking: Reported on 01/09/2023 06/09/22   Carlisle Beers, FNP  gabapentin (NEURONTIN) 300 MG capsule Take 1 capsule (300 mg total) by mouth 3 (three) times daily for 5 days. 06/09/22 06/14/22  Carlisle Beers, FNP  hydrocortisone cream 1 % Apply to affected area 2 times daily Patient not taking: Reported on 01/09/2023 10/29/22   Barrett, Horald Chestnut, PA-C  tiotropium (SPIRIVA) 18 MCG inhalation capsule Place 1 capsule (18 mcg total) into inhaler and inhale daily. Patient not taking: Reported on 01/09/2023 06/13/22   Cathlyn Parsons, NP     History reviewed. No pertinent family history.  Social History   Socioeconomic History   Marital status: Married    Spouse name: Not on file   Number of children: Not on file   Years of education: Not on file   Highest education level: Not on file  Occupational History   Not on file  Tobacco Use   Smoking status: Every Day    Current packs/day: 0.50    Types: Cigarettes   Smokeless tobacco: Never  Vaping Use   Vaping status: Never Used  Substance and Sexual Activity   Alcohol use: No   Drug use: No   Sexual activity: Not on file  Other Topics Concern   Not on file  Social History Narrative   ** Merged History Encounter **       Social Drivers of Health   Financial Resource Strain: Not on file  Food Insecurity: No Food Insecurity (01/10/2023)   Hunger Vital Sign    Worried About Running Out of Food in the Last Year: Never true    Ran Out of Food in the Last Year: Never true  Transportation Needs: No Transportation Needs (01/10/2023)   PRAPARE - Administrator, Civil Service (Medical):  No    Lack of Transportation (Non-Medical): No  Physical Activity: Not on file  Stress: Not on file  Social Connections: Unknown (06/10/2021)   Received from [facility]   Social Network    Social Network: Not on file     Review of Systems: A 12 point ROS discussed and pertinent positives are indicated in the HPI above.  All other systems are negative.  Review of Systems  Constitutional:  Negative for fever.  HENT:  Negative for congestion.   Respiratory:  Negative for cough and shortness of breath.   Cardiovascular:  Negative for chest pain.  Gastrointestinal:  Positive for abdominal pain (right flank pain).  Neurological:  Negative for headaches.  Psychiatric/Behavioral:  Negative for behavioral problems and confusion.     Vital Signs: BP (!) 108/55 (BP Location: Right Arm)   Pulse 93   Temp 98.1 F (36.7 C) (Oral)   Resp 18   Ht 5\' 6"  (1.676 m)   Wt 122 lb 14.4 oz (55.7 kg)   SpO2 94%   BMI 19.84 kg/m   Advance Care Plan: The advanced care plan/surrogate decision maker was discussed at the time of visit and documented in the medical record.wife     Physical Exam Vitals and nursing note reviewed.  Constitutional:      Appearance: He is well-developed. He is ill-appearing.  HENT:     Head: Normocephalic.     Mouth/Throat:     Mouth: Mucous membranes are dry.  Cardiovascular:     Rate and Rhythm: Normal rate and regular rhythm.  Pulmonary:     Effort: Pulmonary effort is normal.  Musculoskeletal:        General: Normal range of motion.     Cervical back: Normal range of motion.  Skin:    General: Skin is dry.  Neurological:     General: No focal deficit present.     Mental Status: He is alert and oriented to person, place, and time.  Psychiatric:        Mood and Affect: Mood normal.        Behavior: Behavior normal.        Thought Content: Thought content normal.        Judgment: Judgment normal.     Imaging: DG Chest 2 View Result  Date: 01/11/2023 CLINICAL DATA:  Short of breath, emphysema EXAM: CHEST - 2 VIEW COMPARISON:  12/11/2021 FINDINGS: Frontal and lateral views of the chest are obtained on 3 images. The cardiac silhouette is stable. Continued hyperinflation and emphysema. No acute airspace disease, effusion, or pneumothorax. No acute bony abnormalities. IMPRESSION: 1. Stable emphysema.  No acute process. Electronically Signed   By: Sharlet Salina M.D.   On: 01/11/2023 20:33   NM Hepatobiliary Liver Func Result Date: 01/11/2023 CLINICAL DATA:  Concern for cholecystitis. EXAM: NUCLEAR MEDICINE HEPATOBILIARY IMAGING TECHNIQUE: Sequential images of the abdomen were obtained out to 60 minutes  following intravenous administration of radiopharmaceutical. An additional 30 minutes of scintigraphic imaging was obtained post intravenous injection of 2.5 mg of morphine. RADIOPHARMACEUTICALS:  5.4 mCi Tc-45m  Choletec IV COMPARISON:  MRI January 10, 2023. FINDINGS: Prompt uptake and biliary excretion of activity by the liver is seen. Subtle rim of increased radiotracer activity along the gallbladder fossa. Gallbladder activity is not identified pre or post morphine administration. Biliary activity passes into small bowel, consistent with patent common bile duct. IMPRESSION: Scintigraphic findings compatible with acute cholecystitis. These results will be called to the ordering clinician or representative by the Radiologist Assistant, and communication documented in the PACS or [employer]. Electronically Signed   By: Maudry Mayhew M.D.   On: 01/11/2023 15:02   MR ABDOMEN MRCP W WO CONTAST Result Date: 01/10/2023 CLINICAL DATA:  67 year old male with history of right upper quadrant abdominal pain. Possible choledocholithiasis. EXAM: MRI ABDOMEN WITHOUT AND WITH CONTRAST (INCLUDING MRCP) TECHNIQUE: Multiplanar multisequence MR imaging of the abdomen was performed both before and after the administration of intravenous contrast.  Heavily T2-weighted images of the biliary and pancreatic ducts were obtained, and three-dimensional MRCP images were rendered by post processing. CONTRAST:  6mL GADAVIST GADOBUTROL 1 MMOL/ML IV SOLN COMPARISON:  No prior abdominal MRI. CT of the abdomen and pelvis 01/09/2023. Right upper quadrant abdominal ultrasound 01/09/2023. FINDINGS: Lower chest: Unremarkable. Hepatobiliary: Multiple small T1 hypointense, T2 hyperintense, nonenhancing lesions are scattered throughout the hepatic parenchyma. The largest of these measure up to 1.5 cm in diameter in segment 3 (axial image 11 of series 22). These lesions are compatible with small cysts and/or biliary hamartomas. No aggressive appearing hepatic lesions are noted. MRCP images demonstrate mild intra and extrahepatic biliary ductal dilatation with the common bile duct measuring up to 8 mm in the porta hepatis. No definite filling defect within the common bile duct to suggest choledocholithiasis. There are numerous filling defects within the gallbladder, compatible with small gallstones. Gallbladder wall appears thickened and edematous. Extensive mural thickening and cystic change is also noted, predominantly in the fundus of the gallbladder, presumably reflective of adenomyomatosis. No definite focal soft tissue enhancement in this region to clearly indicate the presence of a gallbladder mass. Pancreas: No definite pancreatic mass. Mild diffuse dilatation of the main pancreatic duct which measures up to 4 mm. No peripancreatic fluid collections or inflammatory changes. Spleen:  Unremarkable. Adrenals/Urinary Tract: Horseshoe kidney (normal anatomical variant) incidentally noted. No aggressive appearing renal lesions. No hydroureteronephrosis in the visualized portions of the abdomen. Bilateral adrenal glands are normal in appearance. Stomach/Bowel: Visualized portions are unremarkable. Vascular/Lymphatic: Aortic atherosclerosis, without evidence of aneurysm in the  abdominal vasculature. No lymphadenopathy noted in the abdomen. Other: No significant volume of ascites noted in the visualized portions of the peritoneal cavity. Musculoskeletal: No aggressive appearing osseous lesions are noted in the visualized portions of the skeleton. IMPRESSION: 1. 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 Overall, given the lack of sonographic Murphy's sign on recent right upper quadrant abdominal ultrasound, acute cholecystitis is not favored. 2. However, there is mild intra and extrahepatic biliary ductal dilatation, in addition to mild pancreatic ductal dilatation. No obstructing choledocholithiasis noted. No definite pancreatic head mass. However, given the presence of the "double duct sign" (albeit very mild), the possibility of an obstructing ampullary lesion should be considered, and further evaluation with endoscopic ultrasound is recommended if clinically appropriate. 3. Horseshoe kidney (normal anatomical variant) incidentally noted. Electronically  Signed   By: Trudie Reed M.D.   On: 01/10/2023 07:23   MR 3D Recon At Scanner Result Date: 01/10/2023 CLINICAL DATA:  67 year old male with history of right upper quadrant abdominal pain. Possible choledocholithiasis. EXAM: MRI ABDOMEN WITHOUT AND WITH CONTRAST (INCLUDING MRCP) TECHNIQUE: Multiplanar multisequence MR imaging of the abdomen was performed both before and after the administration of intravenous contrast. Heavily T2-weighted images of the biliary and pancreatic ducts were obtained, and three-dimensional MRCP images were rendered by post processing. CONTRAST:  6mL GADAVIST GADOBUTROL 1 MMOL/ML IV SOLN COMPARISON:  No prior abdominal MRI. CT of the abdomen and pelvis 01/09/2023. Right upper quadrant abdominal ultrasound 01/09/2023. FINDINGS: Lower chest: Unremarkable. Hepatobiliary: Multiple small T1  hypointense, T2 hyperintense, nonenhancing lesions are scattered throughout the hepatic parenchyma. The largest of these measure up to 1.5 cm in diameter in segment 3 (axial image 11 of series 22). These lesions are compatible with small cysts and/or biliary hamartomas. No aggressive appearing hepatic lesions are noted. MRCP images demonstrate mild intra and extrahepatic biliary ductal dilatation with the common bile duct measuring up to 8 mm in the porta hepatis. No definite filling defect within the common bile duct to suggest choledocholithiasis. There are numerous filling defects within the gallbladder, compatible with small gallstones. Gallbladder wall appears thickened and edematous. Extensive mural thickening and cystic change is also noted, predominantly in the fundus of the gallbladder, presumably reflective of adenomyomatosis. No definite focal soft tissue enhancement in this region to clearly indicate the presence of a gallbladder mass. Pancreas: No definite pancreatic mass. Mild diffuse dilatation of the main pancreatic duct which measures up to 4 mm. No peripancreatic fluid collections or inflammatory changes. Spleen:  Unremarkable. Adrenals/Urinary Tract: Horseshoe kidney (normal anatomical variant) incidentally noted. No aggressive appearing renal lesions. No hydroureteronephrosis in the visualized portions of the abdomen. Bilateral adrenal glands are normal in appearance. Stomach/Bowel: Visualized portions are unremarkable. Vascular/Lymphatic: Aortic atherosclerosis, without evidence of aneurysm in the abdominal vasculature. No lymphadenopathy noted in the abdomen. Other: No significant volume of ascites noted in the visualized portions of the peritoneal cavity. Musculoskeletal: No aggressive appearing osseous lesions are noted in the visualized portions of the skeleton. IMPRESSION: 1. Cholelithiasis with mild diffuse gallbladder wall thickening and edema, in addition to extensive changes of  adenomyomatosis in the gallbladder fundus. Overall, given the lack of sonographic Murphy's sign on recent right upper quadrant abdominal ultrasound, acute cholecystitis is not favored. 2. However, there is mild intra and extrahepatic biliary ductal dilatation, in addition to mild pancreatic ductal dilatation. No obstructing choledocholithiasis noted. No definite pancreatic head mass. However, given the presence of the "double duct sign" (albeit very mild), the possibility of an obstructing ampullary lesion should be considered, and further evaluation with endoscopic ultrasound is recommended if clinically appropriate. 3. Horseshoe kidney (normal anatomical variant) incidentally noted. Electronically Signed   By: Trudie Reed M.D.   On: 01/10/2023 07:23   US Abdomen Limited RUQ (LIVER/GB) Result Date: 01/09/2023 CLINICAL DATA:  Right upper quadrant pain EXAM: ULTRASOUND ABDOMEN LIMITED RIGHT UPPER QUADRANT COMPARISON:  CT abdomen/pelvis dated 01/09/2023 FINDINGS: Gallbladder: Cholelithiasis with irregular gallbladder wall thickening/edema, most prominent in the gallbladder fundus. No gallbladder dilatation. Negative sonographic Murphy's sign. Common bile duct: Diameter: 13 mm.  Possible 7 mm distal CBD stone. Liver: 1.9 cm simple left hepatic lobe cyst, benign. Within normal limits for parenchymal echogenicity. Portal vein is patent on color Doppler imaging with normal direction of blood flow towards the liver. Other: None. IMPRESSION: Cholelithiasis with  irregular gallbladder wall thickening/edema. Negative sonographic Murphy's sign. However, this appearance remains concerning for acute cholecystitis. Dilated common bile duct measuring 13 mm. Possible choledocholithiasis with a 7 mm distal CBD stone. Electronically Signed   By: Charline Bills M.D.   On: 01/09/2023 22:23   CT ABDOMEN PELVIS W CONTRAST Result Date: 01/09/2023 CLINICAL DATA:  Acute abdominal pain and nausea, initial encounter EXAM: CT  ABDOMEN AND PELVIS WITH CONTRAST TECHNIQUE: Multidetector CT imaging of the abdomen and pelvis was performed using the standard protocol following bolus administration of intravenous contrast. RADIATION DOSE REDUCTION: This exam was performed according to the departmental dose-optimization program which includes automated exposure control, adjustment of the mA and/or kV according to patient size and/or use of iterative reconstruction technique. CONTRAST:  OMNIPAQUE IOHEXOL 300 MG/ML  SOLN COMPARISON:  None Available. FINDINGS: Lower chest: No acute abnormality. Hepatobiliary: Gallbladder is well distended. Some pericholecystic fluid or wall edema is noted. Dilatation of the intrahepatic ductal system is noted. The liver shows scattered small cysts. Common bile duct is mildly prominent although no obstructing stone is seen. Pancreas: Pancreas shows no focal mass. Mild dilatation of the common bile duct is seen Spleen: Normal in size without focal abnormality. Adrenals/Urinary Tract: Adrenal glands are within normal limits. 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 No calculi or obstructive changes are noted. Normal excretion is noted on delayed exam. Bladder is well distended. Stomach/Bowel: The appendix is not visualized consistent with a prior surgical history. No obstructive or inflammatory changes of the colon are seen. Small bowel and stomach are within normal limits. Vascular/Lymphatic: Aortic atherosclerosis. No enlarged abdominal or pelvic lymph nodes. Reproductive: Prostate is mildly prominent. Other: No abdominal wall hernia or abnormality. No abdominopelvic ascites. 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 IMPRESSION: Mildly dilated gallbladder with evidence of pericholecystic fluid/gallbladder wall edema. Intrahepatic biliary ductal dilatation is noted without definitive obstructing lesion. Correlate with  laboratory values. Ultrasound may be helpful for further evaluation of gallbladder. 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 No other focal abnormality is noted. Electronically Signed   By: Alcide Clever M.D.   On: 01/09/2023 20:13    Labs:  CBC: Recent Labs    01/09/23 1543 01/10/23 0434 01/11/23 0453 01/12/23 0432  WBC 14.7* 13.9* 12.2* 12.8*  HGB 15.3 13.7 12.6* 12.4*  HCT 46.4 42.5 38.6* 38.4*  PLT 330 302 222 227    COAGS: No results for input(s): "INR", "APTT" in the last 8760 hours.  BMP: Recent Labs    01/09/23 1543 01/10/23 0434 01/11/23 0453 01/12/23 0432  NA 133* 131* 133* 134*  K 5.0 4.1 4.0 3.8  CL 94* 98 102 102  CO2 29 24 22 25   GLUCOSE 123* 109* 104* 109*  BUN 11 10 9 10   CALCIUM 9.5 8.7* 8.1* 8.2*  CREATININE 0.81 0.75 0.76 0.72  GFRNONAA >60 >60 >60 >60    LIVER FUNCTION TESTS: Recent Labs    01/09/23 1543 01/10/23 0434 01/11/23 0453 01/12/23 0432  BILITOT 0.6 0.5 0.5 0.4  AST 16 15 14* 15  ALT 16 15 11 12   ALKPHOS 124 103 90 86  PROT 7.5 6.5 5.6* 5.6*  ALBUMIN 4.3 3.6 3.0* 2.9*     Assessment and Plan:  67 y.o. male, Smoker. history of emphysema. Presented to [facility] ED at [facility] on 12.10.24 with abdominal pain that radiated to his back. Found to have leukocytosis and cholelithiasis. HIDA from 12.12.24 reads cintigraphic findings  compatible with acute cholecystitis. Team is concerned for ampullary lesion (double duct sign) seen on MRI from 12.11.24. GI is following and plans outpatient EUS for further evaluation. Due to this possible ampullary lesion patient is not deemed a surgical candidate. Team is requesting a cholecystomy tube placement  PLAN: IR Percutaneous Cholecystostomy Tube Placement  Risks and benefits discussed with the patient including bleeding, infection, damage to adjacent structures, bowel perforation/fistula connection, and sepsis.  All of the patient's questions were answered, patient is agreeable to proceed. Consent signed and  in chart.   Thank you for this interesting consult.  I greatly enjoyed meeting Owain Wark and look forward to participating in their care.  A copy of this report was sent to the requesting provider on this date.  Electronically Signed: Alene Mires, NP 01/12/2023, 10:44 AM   I spent a total of 40 Minutes    in face to face in clinical consultation, greater than 50% of which was counseling/coordinating care for cholecystomy tube placement

## 2023-01-13 DIAGNOSIS — K8042 Calculus of bile duct with acute cholecystitis without obstruction: Secondary | ICD-10-CM | POA: Diagnosis not present

## 2023-01-13 LAB — COMPREHENSIVE METABOLIC PANEL
ALT: 26 U/L (ref 0–44)
AST: 22 U/L (ref 15–41)
Albumin: 2.8 g/dL — ABNORMAL LOW (ref 3.5–5.0)
Alkaline Phosphatase: 80 U/L (ref 38–126)
Anion gap: 7 (ref 5–15)
BUN: 12 mg/dL (ref 8–23)
CO2: 24 mmol/L (ref 22–32)
Calcium: 8.1 mg/dL — ABNORMAL LOW (ref 8.9–10.3)
Chloride: 101 mmol/L (ref 98–111)
Creatinine, Ser: 0.72 mg/dL (ref 0.61–1.24)
GFR, Estimated: >60 mL/min (ref >60)
Glucose, Bld: 99 mg/dL (ref 70–99)
Potassium: 3.7 mmol/L (ref 3.5–5.1)
Sodium: 132 mmol/L — ABNORMAL LOW (ref 135–145)
Total Bilirubin: 0.6 mg/dL (ref <1.2)
Total Protein: 5.6 g/dL — ABNORMAL LOW (ref 6.5–8.1)

## 2023-01-13 LAB — CBC WITH DIFFERENTIAL/PLATELET
Abs Immature Granulocytes: 0.05 10*3/uL (ref 0.00–0.07)
Basophils Absolute: 0 10*3/uL (ref 0.0–0.1)
Basophils Relative: 0 %
Eosinophils Absolute: 0.1 10*3/uL (ref 0.0–0.5)
Eosinophils Relative: 1 %
HCT: 40.3 % (ref 39.0–52.0)
Hemoglobin: 12.9 g/dL — ABNORMAL LOW (ref 13.0–17.0)
Immature Granulocytes: 0 %
Lymphocytes Relative: 15 %
Lymphs Abs: 1.8 10*3/uL (ref 0.7–4.0)
MCH: 30.7 pg (ref 26.0–34.0)
MCHC: 32 g/dL (ref 30.0–36.0)
MCV: 96 fL (ref 80.0–100.0)
Monocytes Absolute: 1 10*3/uL (ref 0.1–1.0)
Monocytes Relative: 8 %
Neutro Abs: 8.9 10*3/uL — ABNORMAL HIGH (ref 1.7–7.7)
Neutrophils Relative %: 76 %
Platelets: 259 10*3/uL (ref 150–400)
RBC: 4.2 MIL/uL — ABNORMAL LOW (ref 4.22–5.81)
RDW: 13.5 % (ref 11.5–15.5)
WBC: 11.9 10*3/uL — ABNORMAL HIGH (ref 4.0–10.5)
nRBC: 0 % (ref 0.0–0.2)

## 2023-01-13 LAB — MAGNESIUM: Magnesium: 2.1 mg/dL (ref 1.7–2.4)

## 2023-01-13 MED ORDER — AMOXICILLIN-POT CLAVULANATE 875-125 MG PO TABS
1.0000 | ORAL_TABLET | Freq: Two times a day (BID) | ORAL | Status: DC
  Administered 2023-01-13 – 2023-01-14 (×3): 1 via ORAL
  Filled 2023-01-13 (×3): qty 1

## 2023-01-13 MED ORDER — ENOXAPARIN SODIUM 40 MG/0.4ML IJ SOSY
40.0000 mg | PREFILLED_SYRINGE | INTRAMUSCULAR | Status: DC
  Administered 2023-01-13 – 2023-01-14 (×2): 40 mg via SUBCUTANEOUS
  Filled 2023-01-13 (×2): qty 0.4

## 2023-01-13 MED ORDER — METHOCARBAMOL 500 MG PO TABS
500.0000 mg | ORAL_TABLET | Freq: Three times a day (TID) | ORAL | Status: DC
  Administered 2023-01-13 – 2023-01-14 (×4): 500 mg via ORAL
  Filled 2023-01-13 (×4): qty 1

## 2023-01-13 MED ORDER — IPRATROPIUM-ALBUTEROL 0.5-2.5 (3) MG/3ML IN SOLN
3.0000 mL | Freq: Two times a day (BID) | RESPIRATORY_TRACT | Status: DC
Start: 2023-01-13 — End: 2023-01-14
  Administered 2023-01-13 – 2023-01-14 (×2): 3 mL via RESPIRATORY_TRACT
  Filled 2023-01-13 (×3): qty 3

## 2023-01-13 NOTE — Progress Notes (Signed)
Subjective/Chief Complaint: No complaints. Feels better   Objective: Vital signs in last 24 hours: Temp:  [98 F (36.7 C)-98.6 F (37 C)] 98.4 F (36.9 C) (12/14 0541) Pulse Rate:  [81-98] 81 (12/14 0541) Resp:  [14-24] 17 (12/14 0541) BP: (116-151)/(59-80) 125/68 (12/14 0541) SpO2:  [90 %-98 %] 90 % (12/14 0927) Last BM Date : 01/09/23  Intake/Output from previous day: 12/13 0701 - 12/14 0700 In: 1720.9 [P.O.:1080; I.V.:331.5; IV Piggyback:299.5] Out: 640 [Urine:450; Drains:190] Intake/Output this shift: Total I/O In: 120 [P.O.:120] Out: -   General appearance: alert and cooperative Resp: clear to auscultation bilaterally Cardio: regular rate and rhythm GI: Soft, mild right upper quadrant tenderness  Lab Results:  Recent Labs    01/12/23 0432 01/13/23 0525  WBC 12.8* 11.9*  HGB 12.4* 12.9*  HCT 38.4* 40.3  PLT 227 259   BMET Recent Labs    01/12/23 0432 01/13/23 0525  NA 134* 132*  K 3.8 3.7  CL 102 101  CO2 25 24  GLUCOSE 109* 99  BUN 10 12  CREATININE 0.72 0.72  CALCIUM 8.2* 8.1*   PT/INR No results for input(s): "LABPROT", "INR" in the last 72 hours. ABG No results for input(s): "PHART", "HCO3" in the last 72 hours.  Invalid input(s): "PCO2", "PO2"  Studies/Results: IR Perc Cholecystostomy Result Date: 01/12/2023 CLINICAL DATA:  Acute cholecystitis EXAM: PERCUTANEOUS CHOLECYSTOSTOMY TUBE PLACEMENT WITH ULTRASOUND AND FLUOROSCOPIC GUIDANCE FLUOROSCOPY: Radiation Exposure Index (as provided by the fluoroscopic device): 1 mGy air Kerma TECHNIQUE: The procedure, risks (including but not limited to bleeding, infection, organ damage ), benefits, and alternatives were explained to the patient. Questions regarding the procedure were encouraged and answered. The patient understands and consents to the procedure. Survey ultrasound of the abdomen was performed and an appropriate skin entry site was identified. Skin site was marked, prepped with  chlorhexidine, and draped in usual sterile fashion, and infiltrated locally with 1% lidocaine. Intravenous Fentanyl and Versed 2mg  were administered by RN during a total moderate (conscious) sedation time of 10 minutes; the patient's level of consciousness and physiological / cardiorespiratory status were monitored continuously by radiology RN under my direct supervision. As antibiotic prophylaxis, Flagyl 500 mg was ordered pre-procedure and administered intravenously within one hour of incision. Under real-time ultrasound guidance, gallbladder was accessed using a transhepatic approach with a 21-gauge needle. Ultrasound image documentation was saved. Bile returned through the hub. Needle was exchanged over a 018 guidewire for transitional dilator which allowed placement of 035 J wire. Over this, a 10.2 French pigtail catheter was advanced and formed centrally in the gallbladder lumen. Small contrast injection confirmed appropriate position. Catheter secured externally with 0 Prolene suture and placed external drain bag. Patient tolerated the procedure well. COMPLICATIONS: COMPLICATIONS none IMPRESSION: 1. Technically successful percutaneous cholecystostomy tube placement with ultrasound and fluoroscopic guidance. Electronically Signed   By: Corlis Leak M.D.   On: 01/12/2023 15:49   DG Chest 2 View Result Date: 01/11/2023 CLINICAL DATA:  Short of breath, emphysema EXAM: CHEST - 2 VIEW COMPARISON:  12/11/2021 FINDINGS: Frontal and lateral views of the chest are obtained on 3 images. The cardiac silhouette is stable. Continued hyperinflation and emphysema. No acute airspace disease, effusion, or pneumothorax. No acute bony abnormalities. IMPRESSION: 1. Stable emphysema.  No acute process. Electronically Signed   By: Sharlet Salina M.D.   On: 01/11/2023 20:33   NM Hepatobiliary Liver Func Result Date: 01/11/2023 CLINICAL DATA:  Concern for cholecystitis. EXAM: NUCLEAR MEDICINE HEPATOBILIARY IMAGING  TECHNIQUE:  Sequential images of the abdomen were obtained out to 60 minutes following intravenous administration of radiopharmaceutical. An additional 30 minutes of scintigraphic imaging was obtained post intravenous injection of 2.5 mg of morphine. RADIOPHARMACEUTICALS:  5.4 mCi Tc-22m  Choletec IV COMPARISON:  MRI January 10, 2023. FINDINGS: Prompt uptake and biliary excretion of activity by the liver is seen. Subtle rim of increased radiotracer activity along the gallbladder fossa. Gallbladder activity is not identified pre or post morphine administration. Biliary activity passes into small bowel, consistent with patent common bile duct. IMPRESSION: Scintigraphic findings compatible with acute cholecystitis. These results will be called to the ordering clinician or representative by the Radiologist Assistant, and communication documented in the PACS or [employer]. Electronically Signed   By: Maudry Mayhew M.D.   On: 01/11/2023 15:02    Anti-infectives: Anti-infectives (From admission, onward)    Start     Dose/Rate Route Frequency Ordered Stop   01/13/23 1200  amoxicillin-clavulanate (AUGMENTIN) 875-125 MG per tablet 1 tablet        1 tablet Oral Every 12 hours 01/13/23 1102     01/10/23 0030  cefTRIAXone (ROCEPHIN) 2 g in sodium chloride 0.9 % 100 mL IVPB  Status:  Discontinued        2 g 200 mL/hr over 30 Minutes Intravenous Every 24 hours 01/10/23 0026 01/13/23 1102   01/10/23 0030  metroNIDAZOLE (FLAGYL) IVPB 500 mg  Status:  Discontinued        500 mg 100 mL/hr over 60 Minutes Intravenous Every 12 hours 01/10/23 0026 01/13/23 1102       Assessment/Plan: s/p * No surgery found * Advance diet Continue IV antibiotics until white count normalizes.  Continue percutaneous cholecystostomy tube Ampullary mass evaluation per gastroenterology  LOS: 4 days    Aaron Stevens 01/13/2023

## 2023-01-13 NOTE — Progress Notes (Signed)
Mobility Specialist - Progress Note   01/13/23 0927  Oxygen Therapy  SpO2 90 %  O2 Device Room Air  Patient Activity (if Appropriate) Ambulating  Mobility  Activity Ambulated independently in hallway  Level of Assistance Independent  Assistive Device None  Distance Ambulated (ft) 480 ft  Activity Response Tolerated well  Mobility Referral Yes  Mobility visit 1 Mobility  Mobility Specialist Start Time (ACUTE ONLY) 0912  Mobility Specialist Stop Time (ACUTE ONLY) 0926  Mobility Specialist Time Calculation (min) (ACUTE ONLY) 14 min   Pt received in bed and agreeable to mobility. No complaints during session. Pt to bed after session with all needs met.    Pre-mobility: 111 HR, 92% SpO2 (RA) During mobility: 103 HR, 90% SpO2 (RA) Post-mobility: 109 HR, 90% SPO2 (RA)  Chief Technology Officer

## 2023-01-13 NOTE — Progress Notes (Signed)
[facility] Progress Note Patient: Barret Palmisano ZOX:[id number] DOB: Sep 11, 1955 DOA: 01/09/2023  DOS: the patient was seen and examined on 01/13/2023  Faizaan Mabra is a 67 y.o. male with medical history significant for emphysema, tobacco use, chronic pain syndrome who is admitted with concern for possible choledocholithiasis and acute cholecystitis.  GI and general surgery were consulted.  As well as IR. Underwent percutaneous drain placement.  Assessment and plan. Acute cholecystitis with cholelithiasis. Choledocholithiasis ruled out. RUQ [place] with possible 7 mm distal CBD stone and appearance of gallbladder concerning for acute cholecystitis.  LFTs are within normal limits.  MRCP is negative for choledocholithiasis. Reported to have concern for ampullary lesion given dilated ducts. HIDA scan was positive for acute cholecystitis. GI and general surgery both were consulted. GI recommended outpatient EUS initially. General surgery initially recommended direct lap chole but later on due to his abdominal pain recommended to place a PERC drain. Cultures are currently sent from the drain. Underwent drain placement on 12/13. GI recommending outpatient EUS again. Will advance diet and monitor. Continue antibiotic for now.   Acute exacerbation of COPD: Continues to have wheezing since admission. Has been on antibiotics since admission. Given his abdominal infection unable to use IV or systemic steroids. Will continue with budesonide, Brovana, and DuoNebs. Cough supplements as well.  Active smoker. Patient smokes half a pack a day. Currently has nicotine patch. Would recommend nicotine gum as well. Recommend to quit smoking.   Chronic pain syndrome: Continue home gabapentin.   Subjective: Abdominal pain intermittently present.  No nausea no vomiting.  Passing gas.  Oral intake improving.  Physical Exam: In mild distress from No rash Abdominal pain present  improving. Bowel sound present. No edema. Clear to auscultation today.  Bilateral expiratory wheeze appears to have resolved.  Data Reviewed: I have Reviewed nursing notes, Vitals, and Lab results. Reviewed CBC and CMP.  Reordered CBC and CMP.  Disposition: Status is: Inpatient Remains inpatient appropriate because: Monitor for improvement in abdominal pain.  enoxaparin (LOVENOX) injection 40 mg Start: 01/13/23 1200 SCDs Start: 01/10/23 0025   Family Communication: Wife at bedside Level of care: Med-Surg   Vitals:   01/13/23 0745 01/13/23 0748 01/13/23 0927 01/13/23 1410  BP:    126/61  Pulse:    81  Resp:    18  Temp:    98.9 F (37.2 C)  TempSrc:    Oral  SpO2: 97% 97% 90% 97%  Weight:      Height:         Author: Lynden Oxford, MD 01/13/2023 7:08 PM  Please look on [email or web address] to find out who is on call.

## 2023-01-13 NOTE — Progress Notes (Signed)
PT Cancellation/Screen Note  Patient Details Name: Aaron Stevens MRN: [id number] DOB: [birth year]   Cancelled Treatment:    Reason Eval/Treat Not Completed: PT screened, no needs identified, will sign off. Spoke with patient who denies need for PT services. Pt has been walking daily with mobility specialists without difficulty. No acute PT needs at this time.  Faye Ramsay, PT Acute Rehabilitation  Office: [phone]

## 2023-01-13 NOTE — Plan of Care (Signed)
  Problem: Education: Goal: Knowledge of General Education information will improve Description Including pain rating scale, medication(s)/side effects and non-pharmacologic comfort measures Outcome: Progressing   

## 2023-01-14 ENCOUNTER — Other Ambulatory Visit: Payer: Self-pay | Admitting: Student

## 2023-01-14 DIAGNOSIS — K8042 Calculus of bile duct with acute cholecystitis without obstruction: Secondary | ICD-10-CM | POA: Diagnosis not present

## 2023-01-14 LAB — CBC
HCT: 40 % (ref 39.0–52.0)
Hemoglobin: 13.8 g/dL (ref 13.0–17.0)
MCH: 32.3 pg (ref 26.0–34.0)
MCHC: 34.5 g/dL (ref 30.0–36.0)
MCV: 93.7 fL (ref 80.0–100.0)
Platelets: 311 10*3/uL (ref 150–400)
RBC: 4.27 MIL/uL (ref 4.22–5.81)
RDW: 13.6 % (ref 11.5–15.5)
WBC: 9.8 10*3/uL (ref 4.0–10.5)
nRBC: 0 % (ref 0.0–0.2)

## 2023-01-14 MED ORDER — TIOTROPIUM BROMIDE MONOHYDRATE 18 MCG IN CAPS: 18.0000 ug | ORAL_CAPSULE | Freq: Every day | RESPIRATORY_TRACT | 0 refills | Status: DC

## 2023-01-14 MED ORDER — NICOTINE POLACRILEX 4 MG MT GUM: 4.0000 mg | CHEWING_GUM | OROMUCOSAL | 0 refills | Status: DC | PRN

## 2023-01-14 MED ORDER — AMOXICILLIN-POT CLAVULANATE 875-125 MG PO TABS: 1.0000 | ORAL_TABLET | Freq: Two times a day (BID) | ORAL | 0 refills | Status: DC

## 2023-01-14 MED ORDER — ENSURE ENLIVE PO LIQD: 237.0000 mL | Freq: Two times a day (BID) | ORAL | 0 refills | Status: DC

## 2023-01-14 MED ORDER — METHOCARBAMOL 500 MG PO TABS: 500.0000 mg | ORAL_TABLET | Freq: Three times a day (TID) | ORAL | 0 refills | Status: DC

## 2023-01-14 MED ORDER — ALBUTEROL SULFATE HFA 108 (90 BASE) MCG/ACT IN AERS: 2.0000 | INHALATION_SPRAY | Freq: Four times a day (QID) | RESPIRATORY_TRACT | 0 refills | Status: DC | PRN

## 2023-01-14 MED ORDER — SODIUM CHLORIDE FLUSH 0.9 % IV SOLN
5.0000 mL | Freq: Every day | INTRAVENOUS | 1 refills | Status: DC
  Filled 2023-01-14: qty 300, 30d supply, fill #0

## 2023-01-14 MED ORDER — DOCUSATE SODIUM 100 MG PO CAPS: 100.0000 mg | ORAL_CAPSULE | Freq: Two times a day (BID) | ORAL | 0 refills | Status: DC

## 2023-01-14 MED ORDER — NICOTINE 21 MG/24HR TD PT24: 21.0000 mg | MEDICATED_PATCH | TRANSDERMAL | 1 refills | Status: DC

## 2023-01-14 MED ORDER — OXYCODONE-ACETAMINOPHEN 5-325 MG PO TABS: 1.0000 | ORAL_TABLET | Freq: Four times a day (QID) | ORAL | 0 refills | Status: DC | PRN

## 2023-01-14 MED ORDER — MOMETASONE FURO-FORMOTEROL FUM 200-5 MCG/ACT IN AERO: 2.0000 | INHALATION_SPRAY | Freq: Two times a day (BID) | RESPIRATORY_TRACT | 0 refills | Status: DC

## 2023-01-14 NOTE — Progress Notes (Signed)
Referring Physician(s): Trixie Deis PA-C  Supervising Physician: Oley Balm  Patient Status:  Aaron Stevens - In-pt  Chief Complaint:  Acute cholecystitis S/p perc chole by Dr. Deanne Coffer on 01/12/23.   Subjective:  Patient sitting in a recliner, NAD, states that he is doing great. Spouse at bedside. Denies abd pain, N/V, fever. Going home today. Drain care and f/u discussed in detail, also discussed symptoms and signs of malfunctioning perc chole drain and when to call IR and/or go to ED. Both verbalized understanding.   Allergies: Sulfa antibiotics and Bee venom  Medications: Prior to Admission medications   Medication Sig Start Date End Date Taking? Authorizing Provider  albuterol (VENTOLIN HFA) 108 (90 Base) MCG/ACT inhaler Inhale 1-2 puffs into the lungs every 6 (six) hours as needed for wheezing or shortness of breath. 10/29/22  Yes Barrett, Jamie N, PA-C  gabapentin (NEURONTIN) 300 MG capsule Take 300 mg by mouth at bedtime.   Yes [provider]  OVER THE COUNTER MEDICATION Take 1 Bar by mouth every other day. THC Candy   Yes [provider]  Spacer/Aero-Holding Chambers DEVI 1 each by Does not apply route as needed. 06/13/22  Yes Cathlyn Parsons, NP  albuterol (VENTOLIN HFA) 108 (90 Base) MCG/ACT inhaler Inhale 1-2 puffs into the lungs every 6 (six) hours as needed for wheezing or shortness of breath. Patient not taking: Reported on 01/09/2023 06/09/22   Carlisle Beers, FNP  gabapentin (NEURONTIN) 300 MG capsule Take 1 capsule (300 mg total) by mouth 3 (three) times daily for 5 days. 06/09/22 06/14/22  Carlisle Beers, FNP  hydrocortisone cream 1 % Apply to affected area 2 times daily Patient not taking: Reported on 01/09/2023 10/29/22   Barrett, Horald Chestnut, PA-C  tiotropium (SPIRIVA) 18 MCG inhalation capsule Place 1 capsule (18 mcg total) into inhaler and inhale daily. Patient not taking: Reported on 01/09/2023 06/13/22   Cathlyn Parsons, NP      Vital Signs: BP 126/75 (BP Location: Right Arm)   Pulse (!) 46 Comment: Primary nurse notified  Temp 97.7 F (36.5 C) (Oral)   Resp 18   Ht 5\' 6"  (1.676 m)   Wt 122 lb 14.4 oz (55.7 kg)   SpO2 95%   BMI 19.84 kg/m   Physical Exam Vitals reviewed.  Constitutional:      General: He is not in acute distress.    Appearance: He is not ill-appearing.  HENT:     Head: Normocephalic and atraumatic.  Pulmonary:     Effort: Pulmonary effort is normal.  Abdominal:     General: Abdomen is flat.     Palpations: Abdomen is soft.     Comments: Positive RUQ drain to a gravity. Site is unremarkable with no erythema, edema, tenderness, bleeding or drainage. Suture and stat lock in place. Dressing is clean, dry, and intact. ~10 ml of  serosanguinous fluid noted in the bag. Drain aspirates and flushes well, aspirates simple bile.     Musculoskeletal:     Cervical back: Neck supple.  Skin:    General: Skin is warm and dry.     Coloration: Skin is not jaundiced or pale.  Neurological:     Mental Status: He is alert and oriented to person, place, and time.  Psychiatric:        Mood and Affect: Mood normal.        Behavior: Behavior normal.     Imaging: IR Perc Cholecystostomy Result Date: 01/12/2023 CLINICAL DATA:  Acute cholecystitis EXAM: PERCUTANEOUS CHOLECYSTOSTOMY TUBE PLACEMENT WITH ULTRASOUND AND FLUOROSCOPIC GUIDANCE FLUOROSCOPY: Radiation Exposure Index (as provided by the fluoroscopic device): 1 mGy air Kerma TECHNIQUE: The procedure, risks (including but not limited to bleeding, infection, organ damage ), benefits, and alternatives were explained to the patient. Questions regarding the procedure were encouraged and answered. The patient understands and consents to the procedure. Survey ultrasound of the abdomen was performed and an appropriate skin entry site was identified. Skin site was marked, prepped with chlorhexidine, and draped in usual sterile fashion, and infiltrated  locally with 1% lidocaine. Intravenous Fentanyl and Versed 2mg  were administered by RN during a total moderate (conscious) sedation time of 10 minutes; the patient's level of consciousness and physiological / cardiorespiratory status were monitored continuously by radiology RN under my direct supervision. As antibiotic prophylaxis, Flagyl 500 mg was ordered pre-procedure and administered intravenously within one hour of incision. Under real-time ultrasound guidance, gallbladder was accessed using a transhepatic approach with a 21-gauge needle. Ultrasound image documentation was saved. Bile returned through the hub. Needle was exchanged over a 018 guidewire for transitional dilator which allowed placement of 035 J wire. Over this, a 10.2 French pigtail catheter was advanced and formed centrally in the gallbladder lumen. Small contrast injection confirmed appropriate position. Catheter secured externally with 0 Prolene suture and placed external drain bag. Patient tolerated the procedure well. COMPLICATIONS: COMPLICATIONS none IMPRESSION: 1. Technically successful percutaneous cholecystostomy tube placement with ultrasound and fluoroscopic guidance. Electronically Signed   By: Corlis Leak M.D.   On: 01/12/2023 15:49   DG Chest 2 View Result Date: 01/11/2023 CLINICAL DATA:  Short of breath, emphysema EXAM: CHEST - 2 VIEW COMPARISON:  12/11/2021 FINDINGS: Frontal and lateral views of the chest are obtained on 3 images. The cardiac silhouette is stable. Continued hyperinflation and emphysema. No acute airspace disease, effusion, or pneumothorax. No acute bony abnormalities. IMPRESSION: 1. Stable emphysema.  No acute process. Electronically Signed   By: Sharlet Salina M.D.   On: 01/11/2023 20:33   NM Hepatobiliary Liver Func Result Date: 01/11/2023 CLINICAL DATA:  Concern for cholecystitis. EXAM: NUCLEAR MEDICINE HEPATOBILIARY IMAGING TECHNIQUE: Sequential images of the abdomen were obtained out to 60 minutes  following intravenous administration of radiopharmaceutical. An additional 30 minutes of scintigraphic imaging was obtained post intravenous injection of 2.5 mg of morphine. RADIOPHARMACEUTICALS:  5.4 mCi Tc-7m  Choletec IV COMPARISON:  MRI January 10, 2023. FINDINGS: Prompt uptake and biliary excretion of activity by the liver is seen. Subtle rim of increased radiotracer activity along the gallbladder fossa. Gallbladder activity is not identified pre or post morphine administration. Biliary activity passes into small bowel, consistent with patent common bile duct. IMPRESSION: Scintigraphic findings compatible with acute cholecystitis. These results will be called to the ordering clinician or representative by the Radiologist Assistant, and communication documented in the PACS or [employer]. Electronically Signed   By: Maudry Mayhew M.D.   On: 01/11/2023 15:02    Labs:  CBC: Recent Labs    01/11/23 0453 01/12/23 0432 01/13/23 0525 01/14/23 0851  WBC 12.2* 12.8* 11.9* 9.8  HGB 12.6* 12.4* 12.9* 13.8  HCT 38.6* 38.4* 40.3 40.0  PLT 222 227 259 311    COAGS: No results for input(s): "INR", "APTT" in the last 8760 hours.  BMP: Recent Labs    01/10/23 0434 01/11/23 0453 01/12/23 0432 01/13/23 0525  NA 131* 133* 134* 132*  K 4.1 4.0 3.8 3.7  CL 98 102 102 101  CO2 24 22  25 24  GLUCOSE 109* 104* 109* 99  BUN 10 9 10 12   CALCIUM 8.7* 8.1* 8.2* 8.1*  CREATININE 0.75 0.76 0.72 0.72  GFRNONAA >60 >60 >60 >60    LIVER FUNCTION TESTS: Recent Labs    01/10/23 0434 01/11/23 0453 01/12/23 0432 01/13/23 0525  BILITOT 0.5 0.5 0.4 0.6  AST 15 14* 15 22  ALT 15 11 12 26   ALKPHOS 103 90 86 80  PROT 6.5 5.6* 5.6* 5.6*  ALBUMIN 3.6 3.0* 2.9* 2.8*    Assessment and Plan:  67 y.o. male with emphysema, tobacco use, chronic pain syndrome who was admitted due to acute cholecystitis with cholelithiasis, deemed not a surgical candidate at the moment due to possible ampullary  lesion. Patient is s/p perc chole placment by Dr. Deanne Coffer on 01/12/23.    VSS CBC wnl LFT yesterday wnl Cx pending  Output serosanguinous, aspirated simple bile.   Drain Location: RUQ Size: Fr size: 10 Fr Date of placement: 01/12/23  Currently to: Drain collection device: gravity 24 hour output:  Output by Drain (mL) 01/12/23 0701 - 01/12/23 1900 01/12/23 1901 - 01/13/23 0700 01/13/23 0701 - 01/13/23 1900 01/13/23 1901 - 01/14/23 0700 01/14/23 0701 - 01/14/23 1325  Closed System Drain 1 Lateral;Right RUQ Other (Comment) 10.2 Fr. 100 90  55     Interval imaging/drain manipulation:  None   Current examination: Flushes/aspirates easily.  Insertion site unremarkable. Suture and stat lock in place. Dressed appropriately.   Plan: Continue TID flushes with 5 cc NS. Record output Q shift. Dressing changes QD or PRN if soiled.  Call IR APP or on call IR MD if difficulty flushing or sudden change in drain output.  Repeat imaging/possible drain injection once output < 10 mL/QD (excluding flush material). Consideration for drain removal if output is < 10 mL/QD (excluding flush material), pending discussion with the providing surgical service.  Discharge planning: Patient will be reached by IR schedulers to set up the appointment for cholangiogram and perc chole exchange in 6-8 weeks.  Saline flush sent to [facility] community [facility], patient was asked to pick up the flush tomorrow.   IR will continue to follow - please call with questions or concerns.   Electronically Signed: Willette Brace, PA-C 01/14/2023, 12:35 PM   I spent a total of 15 Minutes at the the patient's bedside AND on the patient's Stevens floor or unit, greater than 50% of which was counseling/coordinating care for perc chole f/u.   This chart was dictated using voice recognition software.  Despite best efforts to proofread,  errors can occur which can change the documentation meaning.

## 2023-01-14 NOTE — Discharge Instructions (Signed)
Interventional Radiology Percutaneous Drain Placement After Care  This sheet gives you information about how to care for yourself after your procedure. Your health care provider may also give you more specific instructions. Your drain was placed by an interventional radiologist with [facility]. If you have questions or concerns, contact [facility] at [phone].  What is a percutaneous drain?  A drain is a small plastic tube (catheter) that goes into the fluid collection in your body through your skin.  How long will I need the drain?  Gallbladder drain will remain in place for at least 6 weeks. You will be scheduled for drain exchange in 6-8 weeks fro the time of the placement.  Interventional radiology will determine when it is time to remove the drain. It is important to follow up as directed so that the drain can be removed as soon as it is safe to do so.  What can I expect after the procedure? After the procedure, it is common to have:  A small amount of bruising and discomfort in the area where the drainage tube (catheter) was placed. Sleepiness and fatigue. This should go away after the medicines you were given have worn off.  Follow these instructions at home:  Insertion site care Check your insertion site when you change the bandage. Check for: More redness, swelling, or pain. More fluid or blood. Warmth. Pus or a bad smell. When caring for your insertion site: Wash your hands with soap and water for at least 20 seconds before and after you change your bandage (dressing). If soap and water are not available, use hand sanitizer. You do not need to change your dressing everyday if it is clean and dry. Change your dressing every 3 days or as needed when it is soiled, wet or becoming dislodged. You will need to change your dressing each time you shower. Leave stitches (sutures), skin glue, or adhesive strips in place. These skin closures may need to stay in  place for 2 weeks or longer. If adhesive strip edges start to loosen and curl up, you may trim the loose edges. Do not remove adhesive strips completely unless your health care provider tells you to do so.  Activity Rest at home for 1-2 days after your procedure. For the first 48 hours do not lift anything more than 10 lbs (about a gallon of milk). You may perform moderate activities/exercise. Please avoid strenuous activities during this time. Avoid any activities which may pull on your drain as this can cause your drain to become dislodged. If you were given a sedative during the procedure, it can affect you for several hours. Do not drive or operate machinery until your health care provider says that it is safe.  General instructions For mild pain take over-the-counter medications as needed for pain such as Tylenol or Advil. If you are experiencing severe pain please call our office as this may indicate an issue with your drain.  If you were prescribed an antibiotic medicine, take it as told by your health care provider. Do not stop using the antibiotic even if you start to feel better. You may shower 24 hours after the drain is placed. To do this cover the insertion site with a water tight material such as saran wrap and seal the edges with tape, you may also purchase waterproof dressings at your local drug store. Shower as usual and then remove the water tight dressing and any gauze/tape underneath it once you have exited the shower  and dried off. Allow the area to air dry or pat dry with a clean towel. Once the skin is completely dry place a new gauze dressing. It is important to keep the site dry at all times to prevent infection. Do not submerge the drain - this means you cannot take baths, swim, use a hot tub, etc. until the drain is removed.  Do not use any products that contain nicotine or tobacco, such as cigarettes, e-cigarettes, and chewing tobacco. If you need help quitting, ask your  health care provider. Keep all follow-up visits as told by your health care provider. This is important.  Contact a health care provider if: You have any of these signs of infection: More redness, swelling, or pain around your incision area. More fluid or blood coming from your incision area. Warmth coming from your incision area. Pus or a bad smell coming from your incision area. You have fluid leaking from around your catheter (instead of through your catheter). You are unable to flush the drain. You have a fever or chills. You have pain that does not get better with medicine. You have not been contacted to schedule a drain follow up appointment within 10 days of discharge from the hospital.  Please call [facility] at [phone] with any questions or concerns.  Get help right away if: Your catheter comes out. You suddenly stop having drainage from your catheter. You suddenly have blood in the fluid that is draining from your catheter. You become dizzy or you faint. You develop a rash. You have nausea or vomiting. You have difficulty breathing or you feel short of breath. You develop chest pain. You have problems with your speech or vision. You have trouble balancing or moving your arms or legs.  Summary It is common to have a small amount of bruising and discomfort in the area where the drainage tube (catheter) was placed. You may also have minor discomfort with movement while the drain is in place. Flush the drain once per day with 5 mL of 0.9% normal saline (unless you were told otherwise by your healthcare provider).  Record the amount of drainage from the bag every time you empty it. Change the dressing every 3 days or earlier if soiled/wet. Keep the skin dry under the dressing. You may shower with the drain in place. Do not submerge the drain (no baths, swimming, hot tubs, etc.). Contact [facility] at [phone] if you have more redness, swelling,  or pain around your incision area or if you have pain that does not get better with medicine.  This information is not intended to replace advice given to you by your health care provider. Make sure you discuss any questions you have with your health care provider. Document Revised: 04/21/2021 Document Reviewed: 01/11/2019 Elsevier Patient Education  2023 [employer].

## 2023-01-14 NOTE — Progress Notes (Signed)
Subjective/Chief Complaint: Feels better   Objective: Vital signs in last 24 hours: Temp:  [97.7 F (36.5 C)-98.9 F (37.2 C)] 97.7 F (36.5 C) (12/15 0557) Pulse Rate:  [46-92] 46 (12/15 0557) Resp:  [16-18] 18 (12/15 0557) BP: (126-127)/(61-75) 126/75 (12/15 0557) SpO2:  [90 %-97 %] 94 % (12/15 0557) Last BM Date : 01/13/23  Intake/Output from previous day: 12/14 0701 - 12/15 0700 In: 1090 [P.O.:1080] Out: 55 [Drains:55] Intake/Output this shift: No intake/output data recorded.  General appearance: alert and cooperative Resp: clear to auscultation bilaterally Cardio: regular rate and rhythm GI: soft, minimal tenderness  Lab Results:  Recent Labs    01/12/23 0432 01/13/23 0525  WBC 12.8* 11.9*  HGB 12.4* 12.9*  HCT 38.4* 40.3  PLT 227 259   BMET Recent Labs    01/12/23 0432 01/13/23 0525  NA 134* 132*  K 3.8 3.7  CL 102 101  CO2 25 24  GLUCOSE 109* 99  BUN 10 12  CREATININE 0.72 0.72  CALCIUM 8.2* 8.1*   PT/INR No results for input(s): "LABPROT", "INR" in the last 72 hours. ABG No results for input(s): "PHART", "HCO3" in the last 72 hours.  Invalid input(s): "PCO2", "PO2"  Studies/Results: IR Perc Cholecystostomy Result Date: 01/12/2023 CLINICAL DATA:  Acute cholecystitis EXAM: PERCUTANEOUS CHOLECYSTOSTOMY TUBE PLACEMENT WITH ULTRASOUND AND FLUOROSCOPIC GUIDANCE FLUOROSCOPY: Radiation Exposure Index (as provided by the fluoroscopic device): 1 mGy air Kerma TECHNIQUE: The procedure, risks (including but not limited to bleeding, infection, organ damage ), benefits, and alternatives were explained to the patient. Questions regarding the procedure were encouraged and answered. The patient understands and consents to the procedure. Survey ultrasound of the abdomen was performed and an appropriate skin entry site was identified. Skin site was marked, prepped with chlorhexidine, and draped in usual sterile fashion, and infiltrated locally with 1%  lidocaine. Intravenous Fentanyl and Versed 2mg  were administered by RN during a total moderate (conscious) sedation time of 10 minutes; the patient's level of consciousness and physiological / cardiorespiratory status were monitored continuously by radiology RN under my direct supervision. As antibiotic prophylaxis, Flagyl 500 mg was ordered pre-procedure and administered intravenously within one hour of incision. Under real-time ultrasound guidance, gallbladder was accessed using a transhepatic approach with a 21-gauge needle. Ultrasound image documentation was saved. Bile returned through the hub. Needle was exchanged over a 018 guidewire for transitional dilator which allowed placement of 035 J wire. Over this, a 10.2 French pigtail catheter was advanced and formed centrally in the gallbladder lumen. Small contrast injection confirmed appropriate position. Catheter secured externally with 0 Prolene suture and placed external drain bag. Patient tolerated the procedure well. COMPLICATIONS: COMPLICATIONS none IMPRESSION: 1. Technically successful percutaneous cholecystostomy tube placement with ultrasound and fluoroscopic guidance. Electronically Signed   By: Corlis Leak M.D.   On: 01/12/2023 15:49    Anti-infectives: Anti-infectives (From admission, onward)    Start     Dose/Rate Route Frequency Ordered Stop   01/13/23 1200  amoxicillin-clavulanate (AUGMENTIN) 875-125 MG per tablet 1 tablet        1 tablet Oral Every 12 hours 01/13/23 1102     01/10/23 0030  cefTRIAXone (ROCEPHIN) 2 g in sodium chloride 0.9 % 100 mL IVPB  Status:  Discontinued        2 g 200 mL/hr over 30 Minutes Intravenous Every 24 hours 01/10/23 0026 01/13/23 1102   01/10/23 0030  metroNIDAZOLE (FLAGYL) IVPB 500 mg  Status:  Discontinued  500 mg 100 mL/hr over 60 Minutes Intravenous Every 12 hours 01/10/23 0026 01/13/23 1102       Assessment/Plan: s/p * No surgery found * Advance diet Continue perc chole tube.  Once wbc normalizes he can be switched to oral abx. He will need follow up in drain clinic. 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 He can see [place] back after Gi eval to see what type of surgery he may need  LOS: 5 days    Aaron Stevens 01/14/2023

## 2023-01-14 NOTE — Plan of Care (Signed)

## 2023-01-14 NOTE — Progress Notes (Signed)
Reviewed written d/c instructions w pt and his wife, reviewed/demonstrated biliary drain care as well. A week supply given to pt, they both verbalized good understanding of above. D/C via w/c w all belongings in stable condition.

## 2023-01-15 ENCOUNTER — Other Ambulatory Visit (HOSPITAL_COMMUNITY): Payer: Self-pay

## 2023-01-15 NOTE — Discharge Summary (Signed)
Physician Discharge Summary   Patient: Aaron Stevens MRN: [id number] DOB: 1955-05-09  Admit date:     01/09/2023  Discharge date: 01/14/2023  Discharge Physician: Lynden Oxford  PCP: Aida Puffer, MD  Recommendations at discharge: Follow-up with PCP. Follow-up with [facility]. Follow-up with general surgery as recommended. Follow-up with IR for drain management.   Follow-up Information     Little, James, MD. Schedule an appointment as soon as possible for a visit in 1 week(s).   Specialty: Family Medicine Contact information: [address] [place] [address] [phone]         Gastroenterology, Deboraha Sprang. Schedule an appointment as soon as possible for a visit in 1 month(s).   Contact information: [address] [place] [address] [phone]         Surgery, [place]. Schedule an appointment as soon as possible for a visit in 2 month(s).   Specialty: General Surgery Contact information: [address] [place] [address] [phone]         [facility]. Schedule an appointment as soon as possible for a visit in 2 week(s).   Specialty: Radiology Contact information: [address] [place] [address] [phone]        Oley Balm, MD Follow up.   Specialties: Interventional Radiology, Radiology Why: Gallbladder drain exchange in 6-8 weeks. If you have questions regarding the appointment, please call [phone]. Contact information: [address] [place] [address] [phone]                Discharge Diagnoses: Principal Problem:   Choledocholithiasis with acute cholecystitis Active Problems:   Emphysema of lung (HCC)   Tobacco use  Hospital Course: Aaron Stevens is a 67 y.o. male with medical history significant for emphysema, tobacco use, chronic pain syndrome who is admitted with concern for  possible choledocholithiasis and acute cholecystitis. Underwent percutaneous drain placement.   Assessment and plan. Acute cholecystitis with cholelithiasis. Choledocholithiasis ruled out. RUQ [place] with possible 7 mm distal CBD stone and appearance of gallbladder concerning for acute cholecystitis.  LFTs are within normal limits.  MRCP is negative for choledocholithiasis. Reported to have concern for ampullary lesion given dilated ducts. HIDA scan was positive for acute cholecystitis. GI and general surgery both were consulted. GI recommended outpatient EUS initially. General surgery initially recommended direct lap chole but later on due to his abdominal pain recommended to place a PERC drain. Cultures are currently sent from the drain. Underwent drain placement on 12/13. GI recommending outpatient EUS again. Will advance diet and monitor. Continue antibiotic for now.   Acute exacerbation of COPD: Continues to have wheezing since admission. Has been on antibiotics since admission. Given his abdominal infection unable to use IV or systemic steroids. Will continue with budesonide, Brovana, and DuoNebs. Cough supplements as well.   Active smoker. Patient smokes half a pack a day. Currently has nicotine patch. Would recommend nicotine gum as well. Recommend to quit smoking.   Chronic pain syndrome: Continue home gabapentin. As needed pain medication also given.  Pain control - [employer] Controlled Substance Reporting System database was reviewed. and patient was instructed, not to drive, operate heavy machinery, perform activities at heights, swimming or participation in water activities or provide baby-sitting services while on Pain, Sleep and Anxiety Medications; until their outpatient Physician has advised to do so again. Also recommended to not to take more than prescribed Pain, Sleep  and Anxiety Medications.  Consultants:  General surgery IR GI  Procedures performed:   Percutaneous drain placement by IR.  DISCHARGE MEDICATION: Allergies as of 01/14/2023       Reactions   Sulfa Antibiotics Anaphylaxis, Other (See Comments)   Bee Venom Other (See Comments)        Medication List     TAKE these medications    albuterol 108 (90 Base) MCG/ACT inhaler Commonly known as: VENTOLIN HFA Inhale 2 puffs into the lungs every 6 (six) hours as needed for wheezing or shortness of breath. What changed:  how much to take Another medication with the same name was removed. Continue taking this medication, and follow the directions you see here.   amoxicillin-clavulanate 875-125 MG tablet Commonly known as: AUGMENTIN Take 1 tablet by mouth 2 (two) times daily for 14 days.   docusate sodium 100 MG capsule Commonly known as: Colace Take 1 capsule (100 mg total) by mouth 2 (two) times daily.   feeding supplement Liqd Take 237 mLs by mouth 2 (two) times daily between meals.   gabapentin 300 MG capsule Commonly known as: NEURONTIN Take 300 mg by mouth at bedtime.   gabapentin 300 MG capsule Commonly known as: Neurontin Take 1 capsule (300 mg total) by mouth 3 (three) times daily for 5 days.   hydrocortisone cream 1 % Apply to affected area 2 times daily   methocarbamol 500 MG tablet Commonly known as: ROBAXIN Take 1 tablet (500 mg total) by mouth 3 (three) times daily.   mometasone-formoterol 200-5 MCG/ACT Aero Commonly known as: DULERA Inhale 2 puffs into the lungs in the morning and at bedtime.   nicotine 21 mg/24hr patch Commonly known as: NICODERM CQ - dosed in mg/24 hours Place 1 patch (21 mg total) onto the skin daily.   nicotine polacrilex 4 MG gum Commonly known as: Nicorette Take 1 each (4 mg total) by mouth as needed for smoking cessation.   OVER THE COUNTER MEDICATION Take 1 Bar by mouth every other day. THC Candy   oxyCODONE-acetaminophen 5-325 MG tablet Commonly known as: Percocet Take 1 tablet by mouth every 6 (six) hours as  needed for severe pain (pain score 7-10).   Spacer/Aero-Holding Rudean Curt 1 each by Does not apply route as needed.   tiotropium 18 MCG inhalation capsule Commonly known as: SPIRIVA Place 1 capsule (18 mcg total) into inhaler and inhale daily.               Discharge Care Instructions  (From admission, onward)           Start     Ordered   01/14/23 0000  Discharge wound care:       Comments: CHOLECYSTOSTOMY DRAIN  connected to gravity bag. Record output every time you empty. Flush with saline as needed and every time you empty it.   01/14/23 1319           Disposition: Home Diet recommendation: Cardiac diet  Discharge Exam: Vitals:   01/13/23 2211 01/14/23 0557 01/14/23 0825 01/14/23 0828  BP: 127/67 126/75    Pulse: 92 (!) 46    Resp: 16 18    Temp: 98.6 F (37 C) 97.7 F (36.5 C)    TempSrc: Oral Oral    SpO2: 96% 94% 95% 95%  Weight:      Height:       General: Appear in mild distress; no visible Abnormal Neck Mass Or lumps, Conjunctiva normal Cardiovascular: S1 and S2 Present, no  Murmur, Respiratory: good respiratory effort, Bilateral Air entry present and no Crackles, bilateral expiratory  wheezes Abdomen: Bowel Sound present, Non tender  Extremities: no Pedal edema Neurology: alert and oriented to time, place, and person  Filed Weights   01/09/23 1439  Weight: 55.7 kg   Condition at discharge: stable  The results of significant diagnostics from this hospitalization (including imaging, microbiology, ancillary and laboratory) are listed below for reference.   Imaging Studies: IR Perc Cholecystostomy Result Date: 01/12/2023 CLINICAL DATA:  Acute cholecystitis EXAM: PERCUTANEOUS CHOLECYSTOSTOMY TUBE PLACEMENT WITH ULTRASOUND AND FLUOROSCOPIC GUIDANCE FLUOROSCOPY: Radiation Exposure Index (as provided by the fluoroscopic device): 1 mGy air Kerma TECHNIQUE: The procedure, risks (including but not limited to bleeding, infection, organ damage ),  benefits, and alternatives were explained to the patient. Questions regarding the procedure were encouraged and answered. The patient understands and consents to the procedure. Survey ultrasound of the abdomen was performed and an appropriate skin entry site was identified. Skin site was marked, prepped with chlorhexidine, and draped in usual sterile fashion, and infiltrated locally with 1% lidocaine. Intravenous Fentanyl and Versed 2mg  were administered by RN during a total moderate (conscious) sedation time of 10 minutes; the patient's level of consciousness and physiological / cardiorespiratory status were monitored continuously by radiology RN under my direct supervision. As antibiotic prophylaxis, Flagyl 500 mg was ordered pre-procedure and administered intravenously within one hour of incision. Under real-time ultrasound guidance, gallbladder was accessed using a transhepatic approach with a 21-gauge needle. Ultrasound image documentation was saved. Bile returned through the hub. Needle was exchanged over a 018 guidewire for transitional dilator which allowed placement of 035 J wire. Over this, a 10.2 French pigtail catheter was advanced and formed centrally in the gallbladder lumen. Small contrast injection confirmed appropriate position. Catheter secured externally with 0 Prolene suture and placed external drain bag. Patient tolerated the procedure well. COMPLICATIONS: COMPLICATIONS none IMPRESSION: 1. Technically successful percutaneous cholecystostomy tube placement with ultrasound and fluoroscopic guidance. Electronically Signed   By: Corlis Leak M.D.   On: 01/12/2023 15:49   DG Chest 2 View Result Date: 01/11/2023 CLINICAL DATA:  Short of breath, emphysema EXAM: CHEST - 2 VIEW COMPARISON:  12/11/2021 FINDINGS: Frontal and lateral views of the chest are obtained on 3 images. The cardiac silhouette is stable. Continued hyperinflation and emphysema. No acute airspace disease, effusion, or  pneumothorax. No acute bony abnormalities. IMPRESSION: 1. Stable emphysema.  No acute process. Electronically Signed   By: Sharlet Salina M.D.   On: 01/11/2023 20:33   NM Hepatobiliary Liver Func Result Date: 01/11/2023 CLINICAL DATA:  Concern for cholecystitis. EXAM: NUCLEAR MEDICINE HEPATOBILIARY IMAGING TECHNIQUE: Sequential images of the abdomen were obtained out to 60 minutes following intravenous administration of radiopharmaceutical. An additional 30 minutes of scintigraphic imaging was obtained post intravenous injection of 2.5 mg of morphine. RADIOPHARMACEUTICALS:  5.4 mCi Tc-74m  Choletec IV COMPARISON:  MRI January 10, 2023. FINDINGS: Prompt uptake and biliary excretion of activity by the liver is seen. Subtle rim of increased radiotracer activity along the gallbladder fossa. Gallbladder activity is not identified pre or post morphine administration. Biliary activity passes into small bowel, consistent with patent common bile duct. IMPRESSION: Scintigraphic findings compatible with acute cholecystitis. These results will be called to the ordering clinician or representative by the Radiologist Assistant, and communication documented in the PACS or [employer]. Electronically Signed   By: Maudry Mayhew M.D.   On: 01/11/2023 15:02   MR ABDOMEN MRCP W WO CONTAST Result  Date: 01/10/2023 CLINICAL DATA:  67 year old male with history of right upper quadrant abdominal pain. Possible choledocholithiasis. EXAM: MRI ABDOMEN WITHOUT AND WITH CONTRAST (INCLUDING MRCP) TECHNIQUE: Multiplanar multisequence MR imaging of the abdomen was performed both before and after the administration of intravenous contrast. Heavily T2-weighted images of the biliary and pancreatic ducts were obtained, and three-dimensional MRCP images were rendered by post processing. CONTRAST:  6mL GADAVIST GADOBUTROL 1 MMOL/ML IV SOLN COMPARISON:  No prior abdominal MRI. CT of the abdomen and pelvis 01/09/2023. Right upper quadrant  abdominal ultrasound 01/09/2023. FINDINGS: Lower chest: Unremarkable. Hepatobiliary: Multiple small T1 hypointense, T2 hyperintense, nonenhancing lesions are scattered throughout the hepatic parenchyma. The largest of these measure up to 1.5 cm in diameter in segment 3 (axial image 11 of series 22). These lesions are compatible with small cysts and/or biliary hamartomas. No aggressive appearing hepatic lesions are noted. MRCP images demonstrate mild intra and extrahepatic biliary ductal dilatation with the common bile duct measuring up to 8 mm in the porta hepatis. No definite filling defect within the common bile duct to suggest choledocholithiasis. There are numerous filling defects within the gallbladder, compatible with small gallstones. Gallbladder wall appears thickened and edematous. Extensive mural thickening and cystic change is also noted, predominantly in the fundus of the gallbladder, presumably reflective of adenomyomatosis. No definite focal soft tissue enhancement in this region to clearly indicate the presence of a gallbladder mass. Pancreas: No definite pancreatic mass. Mild diffuse dilatation of the main pancreatic duct which measures up to 4 mm. No peripancreatic fluid collections or inflammatory changes. Spleen:  Unremarkable. Adrenals/Urinary Tract: Horseshoe kidney (normal anatomical variant) incidentally noted. No aggressive appearing renal lesions. No hydroureteronephrosis in the visualized portions of the abdomen. Bilateral adrenal glands are normal in appearance. Stomach/Bowel: Visualized portions are unremarkable. Vascular/Lymphatic: Aortic atherosclerosis, without evidence of aneurysm in the abdominal vasculature. No lymphadenopathy noted in the abdomen. Other: No significant volume of ascites noted in the visualized portions of the peritoneal cavity. Musculoskeletal: No aggressive appearing osseous lesions are noted in the visualized portions of the skeleton. IMPRESSION: 1.  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 Overall, given the lack of sonographic Murphy's sign on recent right upper quadrant abdominal ultrasound, acute cholecystitis is not favored. 2. However, there is mild intra and extrahepatic biliary ductal dilatation, in addition to mild pancreatic ductal dilatation. No obstructing choledocholithiasis noted. No definite pancreatic head mass. However, given the presence of the "double duct sign" (albeit very mild), the possibility of an obstructing ampullary lesion should be considered, and further evaluation with endoscopic ultrasound is recommended if clinically appropriate. 3. Horseshoe kidney (normal anatomical variant) incidentally noted. Electronically Signed   By: Trudie Reed M.D.   On: 01/10/2023 07:23   MR 3D Recon At Scanner Result Date: 01/10/2023 CLINICAL DATA:  67 year old male with history of right upper quadrant abdominal pain. Possible choledocholithiasis. EXAM: MRI ABDOMEN WITHOUT AND WITH CONTRAST (INCLUDING MRCP) TECHNIQUE: Multiplanar multisequence MR imaging of the abdomen was performed both before and after the administration of intravenous contrast. Heavily T2-weighted images of the biliary and pancreatic ducts were obtained, and three-dimensional MRCP images were rendered by post processing. CONTRAST:  6mL GADAVIST GADOBUTROL 1 MMOL/ML IV SOLN COMPARISON:  No prior abdominal MRI. CT of the abdomen and pelvis 01/09/2023. Right upper quadrant abdominal ultrasound 01/09/2023. FINDINGS: Lower chest: Unremarkable. Hepatobiliary: Multiple small T1 hypointense, T2 hyperintense, nonenhancing lesions are scattered throughout the hepatic parenchyma. The largest  of these measure up to 1.5 cm in diameter in segment 3 (axial image 11 of series 22). These lesions are compatible with small cysts and/or biliary hamartomas. No aggressive appearing  hepatic lesions are noted. MRCP images demonstrate mild intra and extrahepatic biliary ductal dilatation with the common bile duct measuring up to 8 mm in the porta hepatis. No definite filling defect within the common bile duct to suggest choledocholithiasis. There are numerous filling defects within the gallbladder, compatible with small gallstones. Gallbladder wall appears thickened and edematous. Extensive mural thickening and cystic change is also noted, predominantly in the fundus of the gallbladder, presumably reflective of adenomyomatosis. No definite focal soft tissue enhancement in this region to clearly indicate the presence of a gallbladder mass. Pancreas: No definite pancreatic mass. Mild diffuse dilatation of the main pancreatic duct which measures up to 4 mm. No peripancreatic fluid collections or inflammatory changes. Spleen:  Unremarkable. Adrenals/Urinary Tract: Horseshoe kidney (normal anatomical variant) incidentally noted. No aggressive appearing renal lesions. No hydroureteronephrosis in the visualized portions of the abdomen. Bilateral adrenal glands are normal in appearance. Stomach/Bowel: Visualized portions are unremarkable. Vascular/Lymphatic: Aortic atherosclerosis, without evidence of aneurysm in the abdominal vasculature. No lymphadenopathy noted in the abdomen. Other: No significant volume of ascites noted in the visualized portions of the peritoneal cavity. Musculoskeletal: No aggressive appearing osseous lesions are noted in the visualized portions of the skeleton. IMPRESSION: 1. 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 Overall, given the lack of sonographic Murphy's sign on recent right upper quadrant abdominal ultrasound, acute cholecystitis is not favored. 2. However, there is mild intra and extrahepatic biliary ductal dilatation, in addition to mild pancreatic ductal  dilatation. No obstructing choledocholithiasis noted. No definite pancreatic head mass. However, given the presence of the "double duct sign" (albeit very mild), the possibility of an obstructing ampullary lesion should be considered, and further evaluation with endoscopic ultrasound is recommended if clinically appropriate. 3. Horseshoe kidney (normal anatomical variant) incidentally noted. Electronically Signed   By: Trudie Reed M.D.   On: 01/10/2023 07:23   US Abdomen Limited RUQ (LIVER/GB) Result Date: 01/09/2023 CLINICAL DATA:  Right upper quadrant pain EXAM: ULTRASOUND ABDOMEN LIMITED RIGHT UPPER QUADRANT COMPARISON:  CT abdomen/pelvis dated 01/09/2023 FINDINGS: Gallbladder: Cholelithiasis with irregular gallbladder wall thickening/edema, most prominent in the gallbladder fundus. No gallbladder dilatation. Negative sonographic Murphy's sign. Common bile duct: Diameter: 13 mm.  Possible 7 mm distal CBD stone. Liver: 1.9 cm simple left hepatic lobe cyst, benign. Within normal limits for parenchymal echogenicity. Portal vein is patent on color Doppler imaging with normal direction of blood flow towards the liver. Other: None. IMPRESSION: Cholelithiasis with irregular gallbladder wall thickening/edema. Negative sonographic Murphy's sign. However, this appearance remains concerning for acute cholecystitis. Dilated common bile duct measuring 13 mm. Possible choledocholithiasis with a 7 mm distal CBD stone. Electronically Signed   By: Charline Bills M.D.   On: 01/09/2023 22:23   CT ABDOMEN PELVIS W CONTRAST Result Date: 01/09/2023 CLINICAL DATA:  Acute abdominal pain and nausea, initial encounter EXAM: CT ABDOMEN AND PELVIS WITH CONTRAST TECHNIQUE: Multidetector CT imaging of the abdomen and pelvis was performed using the standard protocol following bolus administration of intravenous contrast. RADIATION DOSE REDUCTION: This exam was performed according to the departmental dose-optimization program  which includes automated exposure control, adjustment of the mA and/or kV according to patient size and/or use of iterative reconstruction technique. CONTRAST:  OMNIPAQUE IOHEXOL 300  MG/ML  SOLN COMPARISON:  None Available. FINDINGS: Lower chest: No acute abnormality. Hepatobiliary: Gallbladder is well distended. Some pericholecystic fluid or wall edema is noted. Dilatation of the intrahepatic ductal system is noted. The liver shows scattered small cysts. Common bile duct is mildly prominent although no obstructing stone is seen. Pancreas: Pancreas shows no focal mass. Mild dilatation of the common bile duct is seen Spleen: Normal in size without focal abnormality. Adrenals/Urinary Tract: Adrenal glands are within normal limits. 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 No calculi or obstructive changes are noted. Normal excretion is noted on delayed exam. Bladder is well distended. Stomach/Bowel: The appendix is not visualized consistent with a prior surgical history. No obstructive or inflammatory changes of the colon are seen. Small bowel and stomach are within normal limits. Vascular/Lymphatic: Aortic atherosclerosis. No enlarged abdominal or pelvic lymph nodes. Reproductive: Prostate is mildly prominent. Other: No abdominal wall hernia or abnormality. No abdominopelvic ascites. 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 IMPRESSION: Mildly dilated gallbladder with evidence of pericholecystic fluid/gallbladder wall edema. Intrahepatic biliary ductal dilatation is noted without definitive obstructing lesion. Correlate with laboratory values. Ultrasound may be helpful for further evaluation of gallbladder. 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 No other focal abnormality is noted. Electronically Signed   By: Alcide Clever M.D.   On: 01/09/2023 20:13    Microbiology: Results for orders placed or performed during the  hospital encounter of 01/09/23  Aerobic/Anaerobic Culture w Gram Stain (surgical/deep wound)     Status: None (Preliminary result)   Collection Time: 01/12/23  1:59 PM   Specimen: BILE  Result Value Ref Range Status   Specimen Description   Final    BILE DRAIN Performed at [facility], [address] [place], [place] [address]    Special Requests   Final    NONE Performed at [facility], [address] [place], [place] [address]    Gram Stain   Final    FEW WBC PRESENT, PREDOMINANTLY PMN NO ORGANISMS SEEN    Culture   Final    NO GROWTH 3 DAYS NO ANAEROBES ISOLATED; CULTURE IN PROGRESS FOR 5 DAYS Performed at [facility], [address] [place], [place] [address]    Report Status PENDING  Incomplete   Labs: CBC: Recent Labs  Lab 01/10/23 0434 01/11/23 0453 01/12/23 0432 01/13/23 0525 01/14/23 0851  WBC 13.9* 12.2* 12.8* 11.9* 9.8  NEUTROABS  --  9.1* 10.2* 8.9*  --   HGB 13.7 12.6* 12.4* 12.9* 13.8  HCT 42.5 38.6* 38.4* 40.3 40.0  MCV 94.7 95.3 93.9 96.0 93.7  PLT 302 222 227 259 311   Basic Metabolic Panel: Recent Labs  Lab 01/09/23 1543 01/10/23 0434 01/11/23 0453 01/12/23 0432 01/13/23 0525  NA 133* 131* 133* 134* 132*  K 5.0 4.1 4.0 3.8 3.7  CL 94* 98 102 102 101  CO2 29 24 22 25 24   GLUCOSE 123* 109* 104* 109* 99  BUN 11 10 9 10 12   CREATININE 0.81 0.75 0.76 0.72 0.72  CALCIUM 9.5 8.7* 8.1* 8.2* 8.1*  MG  --   --  1.9 2.0 2.1   Liver Function Tests: Recent Labs  Lab 01/09/23 1543 01/10/23 0434 01/11/23 0453 01/12/23 0432 01/13/23 0525  AST 16 15 14* 15 22  ALT 16 15 11 12 26   ALKPHOS 124 103 90 86 80  BILITOT 0.6 0.5 0.5 0.4 0.6  PROT 7.5 6.5 5.6* 5.6* 5.6*  ALBUMIN 4.3 3.6 3.0* 2.9* 2.8*   CBG: No results for input(s): "GLUCAP" in the last 168 hours.  Discharge time spent: greater than 30 minutes.  Author: Lynden Oxford, MD  [facility] 01/14/2023

## 2023-01-17 LAB — AEROBIC/ANAEROBIC CULTURE W GRAM STAIN (SURGICAL/DEEP WOUND)

## 2023-01-25 ENCOUNTER — Ambulatory Visit: Payer: Self-pay | Admitting: Family Medicine

## 2023-01-25 ENCOUNTER — Other Ambulatory Visit (HOSPITAL_COMMUNITY): Payer: Self-pay | Admitting: Interventional Radiology

## 2023-01-25 ENCOUNTER — Ambulatory Visit (HOSPITAL_COMMUNITY)
Admission: RE | Admit: 2023-01-25 | Discharge: 2023-01-25 | Disposition: A | Discharge status: Home / Self Care | Payer: Medicare Other | Source: Ambulatory Visit | Attending: Student | Admitting: Student

## 2023-01-25 ENCOUNTER — Ambulatory Visit: Payer: Medicare Other | Admitting: Internal Medicine

## 2023-01-25 ENCOUNTER — Other Ambulatory Visit (HOSPITAL_COMMUNITY): Payer: Self-pay | Admitting: Student

## 2023-01-25 DIAGNOSIS — K8042 Calculus of bile duct with acute cholecystitis without obstruction: Secondary | ICD-10-CM

## 2023-01-25 DIAGNOSIS — R1084 Generalized abdominal pain: Secondary | ICD-10-CM

## 2023-01-25 DIAGNOSIS — R1011 Right upper quadrant pain: Secondary | ICD-10-CM

## 2023-01-25 HISTORY — PX: IR SINUS/FIST TUBE CHK-NON GI: IMG673

## 2023-01-25 MED ORDER — LIDOCAINE HCL 1 % IJ SOLN
INTRAMUSCULAR | Status: AC
  Filled 2023-01-25: qty 20

## 2023-01-25 MED ORDER — IOHEXOL 300 MG/ML  SOLN: 50.0000 mL | Freq: Once | INTRAMUSCULAR | Status: DC | PRN

## 2023-01-25 MED ORDER — IOHEXOL 300 MG/ML  SOLN
50.0000 mL | Freq: Once | INTRAMUSCULAR | Status: AC | PRN
  Administered 2023-01-25: 10 mL

## 2023-01-25 NOTE — Telephone Encounter (Signed)
Copied from CRM [id number]. Topic: Appointments - Appointment Cancel/Reschedule >> Jan 25, 2023  7:37 AM [place] A wrote: Patient spoke to doctor yesterday who informed her to call today to get earlier appt the 1:20pm that is scheduled for today. Patient's wife said that he is in pain.Agent transferred to Nurse

## 2023-01-25 NOTE — Telephone Encounter (Signed)
Copied from CRM [id number]. Topic: Appointments - Appointment Cancel/Reschedule >> Jan 25, 2023  7:37 AM [place] A wrote: Patient spoke to doctor yesterday who informed her to call today to get earlier appt the 1:20pm that is scheduled for today. Patient's wife said that he is in pain.Agent transferred to Nurse  Chief Complaint:        Patient had a drain placed on his stomach near gallbladder. The patient has  infection at that area.   This area is not having any issues or concerns. The wife fully understand his care. Her husband has an appointment with the surgery follow up at 2:00 pm. Patient was trying to have time to make both appointment.  Patient has an appointment  today to established  pateint at [facility] [place]. Patient requested appointment with due to paient  need to be established. [facility] is much closer. Advised no new patient appointment is available. RN Agent advised patient  to keep this appointment then request a transfer of care.    Disposition: [] ED /[] Urgent Care (no appt availability in office) / [x] Appointment(In office/virtual)/ []  [facility]/ [] Home Care/ [] Refused Recommended Disposition /[]  Mobile Bus/ []  Follow-up with PCP  Reason for Disposition  General information question, no triage required and triager able to answer question  Answer Assessment - Initial Assessment Questions 1. REASON FOR CALL or QUESTION: "What is your reason for calling today?" or "How can I best help you?" or "What question do you have that I can help answer?"      Patient had a drain placed on his stomach near gallbladder. The patient has  infection at that area.   This area is not having any issues or concerns. The wife fully understand his care. Her husband has an appointment with the surgery follow up at 2:00 pm. Patient was trying to have time to make both appointment.  Patient has an appointment  today to established  pateint at [facility] [place]. Patient  requested appointment with due to paient  need to be established. [facility] is much closer. Advised no new patient appointment is available. RN Agent advised patient  to keep this appointment then request a transfer of care.  Protocols used: Information Only Call - No Triage-A-AH

## 2023-01-26 ENCOUNTER — Ambulatory Visit (INDEPENDENT_AMBULATORY_CARE_PROVIDER_SITE_OTHER): Payer: Medicare Other | Admitting: Urgent Care

## 2023-01-26 ENCOUNTER — Encounter: Payer: Self-pay | Admitting: Urgent Care

## 2023-01-26 VITALS — BP 129/75 | HR 69 | Temp 98.2°F | Ht 67.0 in | Wt 121.1 lb

## 2023-01-26 DIAGNOSIS — Z13220 Encounter for screening for lipoid disorders: Secondary | ICD-10-CM

## 2023-01-26 DIAGNOSIS — R683 Clubbing of fingers: Secondary | ICD-10-CM

## 2023-01-26 DIAGNOSIS — K8042 Calculus of bile duct with acute cholecystitis without obstruction: Secondary | ICD-10-CM | POA: Diagnosis not present

## 2023-01-26 DIAGNOSIS — M79671 Pain in right foot: Secondary | ICD-10-CM

## 2023-01-26 DIAGNOSIS — R1011 Right upper quadrant pain: Secondary | ICD-10-CM

## 2023-01-26 DIAGNOSIS — J439 Emphysema, unspecified: Secondary | ICD-10-CM | POA: Diagnosis not present

## 2023-01-26 DIAGNOSIS — Z720 Tobacco use: Secondary | ICD-10-CM

## 2023-01-26 DIAGNOSIS — R634 Abnormal weight loss: Secondary | ICD-10-CM

## 2023-01-26 DIAGNOSIS — G8929 Other chronic pain: Secondary | ICD-10-CM | POA: Insufficient documentation

## 2023-01-26 DIAGNOSIS — K7689 Other specified diseases of liver: Secondary | ICD-10-CM | POA: Insufficient documentation

## 2023-01-26 MED ORDER — OXYCODONE-ACETAMINOPHEN 5-325 MG PO TABS: 1.0000 | ORAL_TABLET | Freq: Three times a day (TID) | ORAL | 0 refills | Status: DC | PRN

## 2023-01-26 MED ORDER — FLUTICASONE-SALMETEROL 113-14 MCG/ACT IN AEPB: 1.0000 | INHALATION_SPRAY | Freq: Two times a day (BID) | RESPIRATORY_TRACT | 4 refills | Status: DC

## 2023-01-26 NOTE — Assessment & Plan Note (Signed)
Choledocholithiasis Recent hospitalization for gallstone in the main biliary tree causing infection. Currently has a drain in place. Plan for gallbladder removal after infection resolves. (03/21/23 is current surgical date) -Continue current management plan with gastroenterology and surgery. -Check cholesterol levels today to assess for potential cause of gallstones. -refill pain medication. Side effects and appropriate use discussed. PDMP reviewed, low abuse potential. Pt understands to take for severe pain only.

## 2023-01-26 NOTE — Progress Notes (Signed)
New Patient Office Visit  Subjective:  Patient ID: Aaron Stevens, male    DOB: April 15, 1955  Age: 67 y.o. MRN: [id number]  CC:  Chief Complaint  Patient presents with   Establish Care    New pt est care. He has an infection in his gall bladder and needs something for pain.   Discussed the use of AI software for clinical note transcription with the patient who gave verbal consent to proceed.  HPI Aaron Stevens presents to establish care  The patient, with a recent diagnosis of choledocholithiasis, was hospitalized for approximately five to six days for management of a gallbladder infection. 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 The patient has been managing the pain with gabapentin, taking 900mg  at night. The patient reports some relief from the medication but has grown accustomed to the chronic pain.  The patient also reports having a drain for the gallbladder infection, which the guaze and dressings are changed daily. The patient reports more drainage at night and significant pain at the site of the drain. The patient was previously on oxycodone for pain management but ran out two days prior to the consultation. He takes it seldomly, no side effects.  The patient has a history of smoking but quit during the recent hospitalization and is currently using a nicotine patch. The patient was prescribed Dulera and Spiriva at hospital discharge for respiratory management but is not currently taking these due to cost. The patient reports no current respiratory symptoms, although his wife admits he had a chronic cough and wheezes.  The patient also reports a decrease in appetite and subsequent weight loss since the onset of the gallbladder infection. The patient attributes this to fear of eating due to the gallbladder issue. The patient has been  managing constipation with prune juice and stool softeners.  Outpatient Encounter Medications as of 01/26/2023  Medication Sig   albuterol (VENTOLIN HFA) 108 (90 Base) MCG/ACT inhaler Inhale 2 puffs into the lungs every 6 (six) hours as needed for wheezing or shortness of breath.   feeding supplement (ENSURE ENLIVE / ENSURE PLUS) LIQD Take 237 mLs by mouth 2 (two) times daily between meals.   Fluticasone-Salmeterol (AIRDUO RESPICLICK 113/14) 113-14 MCG/ACT AEPB Inhale 1 Inhalation into the lungs in the morning and at bedtime.   gabapentin (NEURONTIN) 300 MG capsule Take 300 mg by mouth at bedtime.   methocarbamol (ROBAXIN) 500 MG tablet Take 1 tablet (500 mg total) by mouth 3 (three) times daily.   nicotine (NICODERM CQ - DOSED IN MG/24 HOURS) 21 mg/24hr patch Place 1 patch (21 mg total) onto the skin daily.   nicotine polacrilex (NICORETTE) 4 MG gum Take 1 each (4 mg total) by mouth as needed for smoking cessation.   OVER THE COUNTER MEDICATION Take 1 Bar by mouth every other day. THC Candy   Spacer/Aero-Holding Chambers DEVI 1 each by Does not apply route as needed.   [DISCONTINUED] amoxicillin-clavulanate (AUGMENTIN) 875-125 MG tablet Take 1 tablet by mouth 2 (two) times daily for 14 days.   [DISCONTINUED] docusate sodium (COLACE) 100 MG capsule Take 1 capsule (100 mg total) by mouth 2 (two) times daily.   [DISCONTINUED] mometasone-formoterol (DULERA) 200-5 MCG/ACT AERO Inhale 2 puffs into the lungs in the morning and at bedtime.   [DISCONTINUED] oxyCODONE-acetaminophen (PERCOCET) 5-325 MG tablet Take 1 tablet by mouth every 6 (  six) hours as needed for severe pain (pain score 7-10).   [DISCONTINUED] tiotropium (SPIRIVA) 18 MCG inhalation capsule Place 1 capsule (18 mcg total) into inhaler and inhale daily.   oxyCODONE-acetaminophen (PERCOCET) 5-325 MG tablet Take 1 tablet by mouth every 8 (eight) hours as needed for severe pain (pain score 7-10).   [DISCONTINUED] gabapentin (NEURONTIN) 300 MG  capsule Take 1 capsule (300 mg total) by mouth 3 (three) times daily for 5 days.   [DISCONTINUED] hydrocortisone cream 1 % Apply to affected area 2 times daily (Patient not taking: Reported on 01/09/2023)   No facility-administered encounter medications on file as of 01/26/2023.    Past Medical History:  Diagnosis Date   Atherosclerosis of artery    Emphysema of lung (HCC)    Pneumonia     Past Surgical History:  Procedure Laterality Date   APPENDECTOMY     IR PERC CHOLECYSTOSTOMY  01/12/2023   IR SINUS/FIST TUBE CHK-NON GI  01/25/2023   KNEE SURGERY      History reviewed. No pertinent family history.  Social History   Socioeconomic History   Marital status: Married    Spouse name: Not on file   Number of children: Not on file   Years of education: Not on file   Highest education level: Not on file  Occupational History   Not on file  Tobacco Use   Smoking status: Every Day    Current packs/day: 0.50    Types: Cigarettes   Smokeless tobacco: Never  Vaping Use   Vaping status: Never Used  Substance and Sexual Activity   Alcohol use: No   Drug use: No   Sexual activity: Not Currently    Partners: Female  Other Topics Concern   Not on file  Social History Narrative   ** Merged History Encounter **       Social Drivers of Health   Financial Resource Strain: Not on file  Food Insecurity: No Food Insecurity (01/10/2023)   Hunger Vital Sign    Worried About Running Out of Food in the Last Year: Never true    Ran Out of Food in the Last Year: Never true  Transportation Needs: No Transportation Needs (01/10/2023)   PRAPARE - Administrator, Civil Service (Medical): No    Lack of Transportation (Non-Medical): No  Physical Activity: Not on file  Stress: Not on file  Social Connections: Unknown (06/10/2021)   [facility]   Social Network    Social Network: Not on file  Intimate Partner Violence: Not At Risk (01/10/2023)    Humiliation, Afraid, Rape, and Kick questionnaire    Fear of Current or Ex-Partner: No    Emotionally Abused: No    Physically Abused: No    Sexually Abused: No    ROS: as noted in HPI  Objective:  BP 129/75   Pulse 69   Temp 98.2 F (36.8 C) (Oral)   Ht 5\' 7"  (1.702 m)   Wt 121 lb 1.9 oz (54.9 kg)   SpO2 100%   BMI 18.97 kg/m   Physical Exam Vitals and nursing note reviewed. Exam conducted with a chaperone present (wife).  Constitutional:      General: He is not in acute distress.    Appearance: Normal appearance. He is normal weight. He is ill-appearing (chronically ill, cachectic). He is not toxic-appearing or diaphoretic.  HENT:     Head: Normocephalic and atraumatic.     Nose: Nose normal.  Mouth/Throat:     Pharynx: Oropharynx is clear. No oropharyngeal exudate or posterior oropharyngeal erythema.  Eyes:     General: No scleral icterus.       Right eye: No discharge.        Left eye: No discharge.     Extraocular Movements: Extraocular movements intact.     Pupils: Pupils are equal, round, and reactive to light.  Cardiovascular:     Rate and Rhythm: Normal rate and regular rhythm.  Pulmonary:     Effort: Pulmonary effort is normal. No respiratory distress.     Breath sounds: No stridor. Wheezing and rhonchi present. No rales.  Abdominal:     General: Abdomen is flat. Bowel sounds are normal. There is no distension.     Palpations: Abdomen is soft. There is no mass.     Tenderness: There is no guarding or rebound.     Comments: Drain in place to R upper abdomen, no surrounding erythema, warmth or discharge.  Musculoskeletal:     Cervical back: Normal range of motion and neck supple. No tenderness.  Lymphadenopathy:     Cervical: No cervical adenopathy.  Skin:    General: Skin is warm and dry.     Coloration: Skin is not jaundiced.     Findings: No erythema or rash.  Neurological:     General: No focal deficit present.     Mental Status: He is alert  and oriented to person, place, and time.  Psychiatric:        Mood and Affect: Mood normal.     Last CBC Lab Results  Component Value Date   WBC 9.8 01/14/2023   HGB 13.8 01/14/2023   HCT 40.0 01/14/2023   MCV 93.7 01/14/2023   MCH 32.3 01/14/2023   RDW 13.6 01/14/2023   PLT 311 01/14/2023   Last metabolic panel Lab Results  Component Value Date   GLUCOSE 99 01/13/2023   NA 132 (L) 01/13/2023   K 3.7 01/13/2023   CL 101 01/13/2023   CO2 24 01/13/2023   BUN 12 01/13/2023   CREATININE 0.72 01/13/2023   GFRNONAA >60 01/13/2023   CALCIUM 8.1 (L) 01/13/2023   PROT 5.6 (L) 01/13/2023   ALBUMIN 2.8 (L) 01/13/2023   BILITOT 0.6 01/13/2023   ALKPHOS 80 01/13/2023   AST 22 01/13/2023   ALT 26 01/13/2023   ANIONGAP 7 01/13/2023   Last lipids No results found for: "CHOL", "HDL", "LDLCALC", "LDLDIRECT", "TRIG", "CHOLHDL" Last hemoglobin A1c No results found for: "HGBA1C" Last thyroid functions No results found for: "TSH", "T3TOTAL", "T4TOTAL", "THYROIDAB" Last vitamin D No results found for: "25OHVITD2", "25OHVITD3", "VD25OH" Last vitamin B12 and Folate No results found for: "VITAMINB12", "FOLATE"    Assessment & Plan:  Choledocholithiasis with acute cholecystitis Assessment & Plan: Choledocholithiasis Recent hospitalization for gallstone in the main biliary tree causing infection. Currently has a drain in place. Plan for gallbladder removal after infection resolves. (03/21/23 is current surgical date) -Continue current management plan with gastroenterology and surgery. -Check cholesterol levels today to assess for potential cause of gallstones. -refill pain medication. Side effects and appropriate use discussed. PDMP reviewed, low abuse potential. Pt understands to take for severe pain only.  Orders: -     oxyCODONE-Acetaminophen; Take 1 tablet by mouth every 8 (eight) hours as needed for severe pain (pain score 7-10).  Dispense: 21 tablet; Refill: 0 -     Lipid  panel -     TSH -     CBC  with Differential/Platelet -     Comprehensive metabolic panel  RUQ abdominal pain -     oxyCODONE-Acetaminophen; Take 1 tablet by mouth every 8 (eight) hours as needed for severe pain (pain score 7-10).  Dispense: 21 tablet; Refill: 0 -     Lipid panel -     TSH -     CBC with Differential/Platelet -     Comprehensive metabolic panel  Pulmonary emphysema, unspecified emphysema type (HCC) Assessment & Plan: Patient unable to afford prescribed Dulera inhaler. -Switch to Air Duo inhaler and provide information on GoodRx for potential cost savings. -go to [employer] and apply for savings/ samples/ free medications -Reassess respiratory status in 3-4 weeks.  Orders: -     Fluticasone-Salmeterol; Inhale 1 Inhalation into the lungs in the morning and at bedtime.  Dispense: 1 each; Refill: 4 -     CBC with Differential/Platelet -     Comprehensive metabolic panel  Screening cholesterol level -     Lipid panel -     CBC with Differential/Platelet -     Comprehensive metabolic panel  Weight loss, unintentional -     Lipid panel -     TSH -     CBC with Differential/Platelet -     Comprehensive metabolic panel  Other specified diseases of liver -     Lipid panel -     CBC with Differential/Platelet -     Comprehensive metabolic panel  Clubbing of nail  Tobacco use Assessment & Plan: Patient recently quit smoking while hospitalized. -Continue nicotine patch. Encourage patient to report any cravings or desire to smoke for potential medication management.   Chronic foot pain, right Assessment & Plan: History of work-related injury leading to chronic pain managed with Gabapentin 900mg  at night. -Continue Gabapentin 900mg  at night. Discussed potential for increased dosing if needed, but patient currently tolerating pain. -pt states this is prescribed from workers comp who is managing this condition, will not be getting refills  here     Return in about 4 weeks (around 02/23/2023).   Maretta Bees, PA

## 2023-01-26 NOTE — Assessment & Plan Note (Signed)
Patient unable to afford prescribed Dulera inhaler. -Switch to Air Duo inhaler and provide information on GoodRx for potential cost savings. -go to [employer] and apply for savings/ samples/ free medications -Reassess respiratory status in 3-4 weeks.

## 2023-01-26 NOTE — Assessment & Plan Note (Signed)
Patient recently quit smoking while hospitalized. -Continue nicotine patch. Encourage patient to report any cravings or desire to smoke for potential medication management.

## 2023-01-26 NOTE — Patient Instructions (Addendum)
We obtained labs today. Please only take the percocet for SEVERE pain. Taking too much too often can lead to low blood pressure, dizziness, constipation and addiction.  You may take extra-strength tylenol as needed for moderate pain. Do not exceed 3 grams of tylenol from all sources in a 24 hour period.  Please fill the airduo inhaler. Inhale one puff twice daily. If this does not help, we can increase the strength.  Please go to [email or web address] And see if you qualify for a patient savings for spiriva. This is an excellent medication for patients with COPD.  Please return in 3-4 weeks for a recheck, sooner if needed.

## 2023-01-26 NOTE — Assessment & Plan Note (Signed)
History of work-related injury leading to chronic pain managed with Gabapentin 900mg  at night. -Continue Gabapentin 900mg  at night. Discussed potential for increased dosing if needed, but patient currently tolerating pain. -pt states this is prescribed from workers comp who is managing this condition, will not be getting refills here

## 2023-01-27 LAB — CBC WITH DIFFERENTIAL/PLATELET
Absolute Lymphocytes: 2703 {cells}/uL (ref 850–3900)
Absolute Monocytes: 762 {cells}/uL (ref 200–950)
Basophils Absolute: 59 {cells}/uL (ref 0–200)
Basophils Relative: 0.6 %
Eosinophils Absolute: 267 {cells}/uL (ref 15–500)
Eosinophils Relative: 2.7 %
HCT: 41.3 % (ref 38.5–50.0)
Hemoglobin: 13.6 g/dL (ref 13.2–17.1)
MCH: 30.2 pg (ref 27.0–33.0)
MCHC: 32.9 g/dL (ref 32.0–36.0)
MCV: 91.8 fL (ref 80.0–100.0)
MPV: 8.9 fL (ref 7.5–12.5)
Monocytes Relative: 7.7 %
Neutro Abs: 6108 {cells}/uL (ref 1500–7800)
Neutrophils Relative %: 61.7 %
Platelets: 499 10*3/uL — ABNORMAL HIGH (ref 140–400)
RBC: 4.5 10*6/uL (ref 4.20–5.80)
RDW: 13 % (ref 11.0–15.0)
Total Lymphocyte: 27.3 %
WBC: 9.9 10*3/uL (ref 3.8–10.8)

## 2023-01-27 LAB — LIPID PANEL
Cholesterol: 161 mg/dL (ref <200)
HDL: 61 mg/dL (ref >=40)
LDL Cholesterol (Calc): 85 mg/dL
Non-HDL Cholesterol (Calc): 100 mg/dL (ref <130)
Total CHOL/HDL Ratio: 2.6 (calc) (ref <5.0)
Triglycerides: 68 mg/dL (ref <150)

## 2023-01-27 LAB — COMPREHENSIVE METABOLIC PANEL
AG Ratio: 1.7 (calc) (ref 1.0–2.5)
ALT: 21 U/L (ref 9–46)
AST: 17 U/L (ref 10–35)
Albumin: 3.9 g/dL (ref 3.6–5.1)
Alkaline phosphatase (APISO): 89 U/L (ref 35–144)
BUN: 14 mg/dL (ref 7–25)
CO2: 32 mmol/L (ref 20–32)
Calcium: 9.3 mg/dL (ref 8.6–10.3)
Chloride: 99 mmol/L (ref 98–110)
Creat: 0.79 mg/dL (ref 0.70–1.35)
Globulin: 2.3 g/dL (ref 1.9–3.7)
Glucose, Bld: 88 mg/dL (ref 65–99)
Potassium: 5.4 mmol/L — ABNORMAL HIGH (ref 3.5–5.3)
Sodium: 138 mmol/L (ref 135–146)
Total Bilirubin: 0.4 mg/dL (ref 0.2–1.2)
Total Protein: 6.2 g/dL (ref 6.1–8.1)

## 2023-01-27 LAB — TSH: TSH: 6.48 m[IU]/L — ABNORMAL HIGH (ref 0.40–4.50)

## 2023-02-05 ENCOUNTER — Telehealth: Payer: Self-pay

## 2023-02-05 ENCOUNTER — Telehealth: Payer: Self-pay | Admitting: Urgent Care

## 2023-02-05 NOTE — Telephone Encounter (Signed)
 Reason for CRM: pt called regarding his prescription refill. Stated he's having a hard time getting his refill methocarbamol  (ROBAXIN ) 500 MG tablet. Would like to speak with the provider. Please call pt back at [phone]   Called and spoke with pt. The medication the pt was asking a refill for is not currently prescribed by [facility]. I informed the pt the medication is prescribed by a Dr. Tobie.  Pt understood.

## 2023-02-05 NOTE — Telephone Encounter (Signed)
  Communication  Reason for CRM: Patient Would like to speak with doctor regarding tube in stomach and why it's taking so long to take it out // call back # [phone]

## 2023-02-05 NOTE — Telephone Encounter (Signed)
 Pt was called and notified that the medication he is requesting is not prescribed by PCP. Pt understood.

## 2023-02-09 ENCOUNTER — Other Ambulatory Visit: Payer: Self-pay | Admitting: Urgent Care

## 2023-02-09 ENCOUNTER — Ambulatory Visit: Payer: Self-pay | Admitting: Urgent Care

## 2023-02-09 DIAGNOSIS — K8042 Calculus of bile duct with acute cholecystitis without obstruction: Secondary | ICD-10-CM

## 2023-02-09 DIAGNOSIS — R1011 Right upper quadrant pain: Secondary | ICD-10-CM

## 2023-02-09 NOTE — Telephone Encounter (Signed)
 Duplicate request, already forwarded to office per protocol

## 2023-02-09 NOTE — Telephone Encounter (Signed)
 Copied from CRM [id number]. Topic: Clinical - Medication Refill >> Feb 09, 2023  7:54 AM Pinkey ORN wrote: Most Recent Primary Care Visit:  Provider: CRAIN, WHITNEY L  Department: [facility]  Visit Type: NEW PATIENT  Date: 01/26/2023  Medication: methocarbamol  (ROBAXIN ) 500 MG tablet , oxyCODONE -acetaminophen  (PERCOCET) 5-325 MG tablet  Has the patient contacted their pharmacy? No (Agent: If no, request that the patient contact the pharmacy for the refill. If patient does not wish to contact the pharmacy document the reason why and proceed with request.) (Agent: If yes, when and what did the pharmacy advise?)  Is this the correct pharmacy for this prescription? Yes If no, delete pharmacy and type the correct one.  This is the patient's preferred pharmacy:  [id number] - [place], [place] - [address] [place] [address] Phone: [phone] Fax: [phone]  [id number] - [place], [place] - [address] [place] [address] Phone: [phone] Fax: [phone]  [facility] - [place], [place] - [address] [place] [address] Phone: [phone] Fax: [phone]  [facility] - [facility] [address] [place] [address] Phone: [phone] Fax: [phone]   Has the prescription been filled recently? Yes  Is the patient out of the medication? Yes  Has the patient been seen for an appointment in the last year OR does the patient have an upcoming appointment? Yes  Can we respond through MyChart? Yes  Agent: Please be advised that Rx refills may take up to 3 business days. We ask that you follow-up with your pharmacy.

## 2023-02-10 ENCOUNTER — Inpatient Hospital Stay (HOSPITAL_COMMUNITY)
Admission: EM | Admit: 2023-02-10 | Discharge: 2023-02-18 | DRG: 418 | Disposition: A | Discharge status: Home / Self Care | Payer: Medicare Other | Attending: Student in an Organized Health Care Education/Training Program | Admitting: Student in an Organized Health Care Education/Training Program

## 2023-02-10 ENCOUNTER — Other Ambulatory Visit: Payer: Self-pay

## 2023-02-10 ENCOUNTER — Encounter (HOSPITAL_COMMUNITY): Payer: Self-pay

## 2023-02-10 ENCOUNTER — Emergency Department (HOSPITAL_COMMUNITY): Payer: Medicare Other

## 2023-02-10 DIAGNOSIS — Z8701 Personal history of pneumonia (recurrent): Secondary | ICD-10-CM | POA: Diagnosis not present

## 2023-02-10 DIAGNOSIS — K810 Acute cholecystitis: Secondary | ICD-10-CM | POA: Diagnosis not present

## 2023-02-10 DIAGNOSIS — D649 Anemia, unspecified: Secondary | ICD-10-CM | POA: Diagnosis present

## 2023-02-10 DIAGNOSIS — J439 Emphysema, unspecified: Secondary | ICD-10-CM | POA: Diagnosis present

## 2023-02-10 DIAGNOSIS — K8001 Calculus of gallbladder with acute cholecystitis with obstruction: Principal | ICD-10-CM | POA: Diagnosis present

## 2023-02-10 DIAGNOSIS — Z720 Tobacco use: Secondary | ICD-10-CM | POA: Diagnosis present

## 2023-02-10 DIAGNOSIS — R54 Age-related physical debility: Secondary | ICD-10-CM | POA: Diagnosis present

## 2023-02-10 DIAGNOSIS — Z681 Body mass index (BMI) 19 or less, adult: Secondary | ICD-10-CM

## 2023-02-10 DIAGNOSIS — K819 Cholecystitis, unspecified: Principal | ICD-10-CM | POA: Diagnosis present

## 2023-02-10 DIAGNOSIS — Z79899 Other long term (current) drug therapy: Secondary | ICD-10-CM

## 2023-02-10 DIAGNOSIS — K7689 Other specified diseases of liver: Secondary | ICD-10-CM | POA: Diagnosis present

## 2023-02-10 DIAGNOSIS — R64 Cachexia: Secondary | ICD-10-CM | POA: Diagnosis present

## 2023-02-10 DIAGNOSIS — F17210 Nicotine dependence, cigarettes, uncomplicated: Secondary | ICD-10-CM | POA: Diagnosis present

## 2023-02-10 DIAGNOSIS — K861 Other chronic pancreatitis: Secondary | ICD-10-CM | POA: Diagnosis present

## 2023-02-10 DIAGNOSIS — K660 Peritoneal adhesions (postprocedural) (postinfection): Secondary | ICD-10-CM | POA: Diagnosis present

## 2023-02-10 DIAGNOSIS — K8042 Calculus of bile duct with acute cholecystitis without obstruction: Secondary | ICD-10-CM

## 2023-02-10 DIAGNOSIS — K8020 Calculus of gallbladder without cholecystitis without obstruction: Secondary | ICD-10-CM | POA: Diagnosis present

## 2023-02-10 DIAGNOSIS — K838 Other specified diseases of biliary tract: Secondary | ICD-10-CM | POA: Diagnosis not present

## 2023-02-10 DIAGNOSIS — Z87891 Personal history of nicotine dependence: Secondary | ICD-10-CM | POA: Diagnosis not present

## 2023-02-10 DIAGNOSIS — J449 Chronic obstructive pulmonary disease, unspecified: Secondary | ICD-10-CM | POA: Diagnosis not present

## 2023-02-10 LAB — CBC WITH DIFFERENTIAL/PLATELET
Abs Immature Granulocytes: 0.06 10*3/uL (ref 0.00–0.07)
Basophils Absolute: 0.1 10*3/uL (ref 0.0–0.1)
Basophils Relative: 1 %
Eosinophils Absolute: 0.5 10*3/uL (ref 0.0–0.5)
Eosinophils Relative: 5 %
HCT: 39 % (ref 39.0–52.0)
Hemoglobin: 12.5 g/dL — ABNORMAL LOW (ref 13.0–17.0)
Immature Granulocytes: 1 %
Lymphocytes Relative: 25 %
Lymphs Abs: 2.8 10*3/uL (ref 0.7–4.0)
MCH: 30.6 pg (ref 26.0–34.0)
MCHC: 32.1 g/dL (ref 30.0–36.0)
MCV: 95.6 fL (ref 80.0–100.0)
Monocytes Absolute: 0.9 10*3/uL (ref 0.1–1.0)
Monocytes Relative: 9 %
Neutro Abs: 6.6 10*3/uL (ref 1.7–7.7)
Neutrophils Relative %: 59 %
Platelets: 262 10*3/uL (ref 150–400)
RBC: 4.08 MIL/uL — ABNORMAL LOW (ref 4.22–5.81)
RDW: 14.6 % (ref 11.5–15.5)
WBC: 11 10*3/uL — ABNORMAL HIGH (ref 4.0–10.5)
nRBC: 0 % (ref 0.0–0.2)

## 2023-02-10 LAB — COMPREHENSIVE METABOLIC PANEL
ALT: 30 U/L (ref 0–44)
AST: 32 U/L (ref 15–41)
Albumin: 3.6 g/dL (ref 3.5–5.0)
Alkaline Phosphatase: 74 U/L (ref 38–126)
Anion gap: 4 — ABNORMAL LOW (ref 5–15)
BUN: 16 mg/dL (ref 8–23)
CO2: 27 mmol/L (ref 22–32)
Calcium: 8.9 mg/dL (ref 8.9–10.3)
Chloride: 104 mmol/L (ref 98–111)
Creatinine, Ser: 0.52 mg/dL — ABNORMAL LOW (ref 0.61–1.24)
GFR, Estimated: >60 mL/min (ref >60)
Glucose, Bld: 82 mg/dL (ref 70–99)
Potassium: 4.8 mmol/L (ref 3.5–5.1)
Sodium: 135 mmol/L (ref 135–145)
Total Bilirubin: 1.1 mg/dL (ref 0.0–1.2)
Total Protein: 6.7 g/dL (ref 6.5–8.1)

## 2023-02-10 LAB — I-STAT CG4 LACTIC ACID, ED
Lactic Acid, Venous: 0.5 mmol/L (ref 0.5–1.9)
Lactic Acid, Venous: 0.8 mmol/L (ref 0.5–1.9)

## 2023-02-10 MED ORDER — ENOXAPARIN SODIUM 40 MG/0.4ML IJ SOSY: 40.0000 mg | PREFILLED_SYRINGE | INTRAMUSCULAR | Status: DC

## 2023-02-10 MED ORDER — FLUTICASONE-SALMETEROL 113-14 MCG/ACT IN AEPB: 1.0000 | INHALATION_SPRAY | Freq: Two times a day (BID) | RESPIRATORY_TRACT | Status: DC

## 2023-02-10 MED ORDER — IOHEXOL 300 MG/ML  SOLN
100.0000 mL | Freq: Once | INTRAMUSCULAR | Status: AC | PRN
  Administered 2023-02-10: 100 mL via INTRAVENOUS

## 2023-02-10 MED ORDER — PIPERACILLIN-TAZOBACTAM 3.375 G IVPB
3.3750 g | Freq: Once | INTRAVENOUS | Status: AC
  Administered 2023-02-10: 3.375 g via INTRAVENOUS
  Filled 2023-02-10: qty 50

## 2023-02-10 MED ORDER — METHOCARBAMOL 500 MG PO TABS
500.0000 mg | ORAL_TABLET | Freq: Three times a day (TID) | ORAL | Status: DC
  Administered 2023-02-10 – 2023-02-13 (×9): 500 mg via ORAL
  Filled 2023-02-10 (×9): qty 1

## 2023-02-10 MED ORDER — HYDROMORPHONE HCL 1 MG/ML IJ SOLN
0.5000 mg | INTRAMUSCULAR | Status: DC | PRN
  Administered 2023-02-12 – 2023-02-13 (×5): 0.5 mg via INTRAVENOUS
  Filled 2023-02-10 (×5): qty 0.5

## 2023-02-10 MED ORDER — ACETAMINOPHEN 325 MG PO TABS
650.0000 mg | ORAL_TABLET | Freq: Four times a day (QID) | ORAL | Status: DC | PRN
  Administered 2023-02-12 – 2023-02-18 (×2): 650 mg via ORAL
  Filled 2023-02-10 (×2): qty 2

## 2023-02-10 MED ORDER — ONDANSETRON HCL 4 MG/2ML IJ SOLN
4.0000 mg | Freq: Four times a day (QID) | INTRAMUSCULAR | Status: DC | PRN
  Administered 2023-02-11: 4 mg via INTRAVENOUS
  Filled 2023-02-10: qty 2

## 2023-02-10 MED ORDER — MOMETASONE FURO-FORMOTEROL FUM 100-5 MCG/ACT IN AERO
2.0000 | INHALATION_SPRAY | Freq: Two times a day (BID) | RESPIRATORY_TRACT | Status: DC
  Administered 2023-02-10 – 2023-02-18 (×16): 2 via RESPIRATORY_TRACT
  Filled 2023-02-10: qty 8.8

## 2023-02-10 MED ORDER — ONDANSETRON HCL 4 MG PO TABS: 4.0000 mg | ORAL_TABLET | Freq: Four times a day (QID) | ORAL | Status: DC | PRN

## 2023-02-10 MED ORDER — ACETAMINOPHEN 650 MG RE SUPP: 650.0000 mg | Freq: Four times a day (QID) | RECTAL | Status: DC | PRN

## 2023-02-10 MED ORDER — GABAPENTIN 100 MG PO CAPS
300.0000 mg | ORAL_CAPSULE | Freq: Every day | ORAL | Status: DC
Start: 2023-02-10 — End: 2023-02-18
  Administered 2023-02-10 – 2023-02-17 (×8): 300 mg via ORAL
  Filled 2023-02-10 (×8): qty 3

## 2023-02-10 MED ORDER — OXYCODONE HCL 5 MG PO TABS
5.0000 mg | ORAL_TABLET | Freq: Four times a day (QID) | ORAL | Status: DC | PRN
  Administered 2023-02-10 – 2023-02-14 (×9): 5 mg via ORAL
  Filled 2023-02-10 (×9): qty 1

## 2023-02-10 MED ORDER — HYDROMORPHONE HCL 1 MG/ML IJ SOLN
1.0000 mg | Freq: Once | INTRAMUSCULAR | Status: AC
  Administered 2023-02-10: 1 mg via INTRAVENOUS
  Filled 2023-02-10: qty 1

## 2023-02-10 MED ORDER — ALBUTEROL SULFATE (2.5 MG/3ML) 0.083% IN NEBU
2.5000 mg | INHALATION_SOLUTION | Freq: Four times a day (QID) | RESPIRATORY_TRACT | Status: DC | PRN
  Administered 2023-02-10: 2.5 mg via RESPIRATORY_TRACT
  Filled 2023-02-10: qty 3

## 2023-02-10 MED ORDER — ALBUTEROL SULFATE HFA 108 (90 BASE) MCG/ACT IN AERS: 2.0000 | INHALATION_SPRAY | Freq: Four times a day (QID) | RESPIRATORY_TRACT | Status: DC | PRN

## 2023-02-10 NOTE — ED Provider Notes (Signed)
 [place] EMERGENCY DEPARTMENT AT [facility] Provider Note   CSN: [id number] Arrival date & time: 02/10/23  1302     History  Chief Complaint  Patient presents with   Abdominal Pain    Drain issues    Aaron Stevens is a 67 y.o. male.  HPI Patient presents with his wife who assist with history. Patient has a notable history of acute cholecystitis last month.  Treatment was percutaneous drain, and he presents today with concerns about this.  He notes over the past few days he has had increasing pain in the area, with new redness around the insertion site, that is possible.  Notably, the patient's wife tried to flush the drain, per instructions, and whereas she was previously billed to do so, today she was unable to inject even 2 mL of solution without severe pain.  There does continue to be drainage from the device itself.  No new fever, vomiting, pain in other areas.    Home Medications Prior to Admission medications   Medication Sig Start Date End Date Taking? Authorizing Provider  albuterol  (VENTOLIN  HFA) 108 (90 Base) MCG/ACT inhaler Inhale 2 puffs into the lungs every 6 (six) hours as needed for wheezing or shortness of breath. 01/14/23   Tobie Yetta HERO, MD  feeding supplement (ENSURE ENLIVE / ENSURE PLUS) LIQD Take 237 mLs by mouth 2 (two) times daily between meals. 01/14/23   Tobie Yetta HERO, MD  Fluticasone -Salmeterol (AIRDUO RESPICLICK 113/14) 113-14 MCG/ACT AEPB Inhale 1 Inhalation into the lungs in the morning and at bedtime. 01/26/23   Crain, Whitney L, PA  gabapentin  (NEURONTIN ) 300 MG capsule Take 300 mg by mouth at bedtime.    [provider]  levETIRAcetam (KEPPRA) 250 MG tablet Take 250 mg by mouth every 8 (eight) hours.    [provider]  methocarbamol  (ROBAXIN ) 500 MG tablet Take 1 tablet (500 mg total) by mouth 3 (three) times daily. 01/14/23   Tobie Yetta HERO, MD  nicotine  (NICODERM CQ  - DOSED IN MG/24 HOURS) 21 mg/24hr patch  Place 1 patch (21 mg total) onto the skin daily. 01/14/23 01/14/24  Tobie Yetta HERO, MD  nicotine  polacrilex (NICORETTE ) 4 MG gum Take 1 each (4 mg total) by mouth as needed for smoking cessation. 01/14/23   Tobie Yetta HERO, MD  OVER THE COUNTER MEDICATION Take 1 Bar by mouth every other day. [facility] Candy    [provider]  oxyCODONE -acetaminophen  (PERCOCET) 5-325 MG tablet Take 1 tablet by mouth every 8 (eight) hours as needed for severe pain (pain score 7-10). 01/26/23 01/26/24  Crain, Whitney L, PA  Spacer/Aero-Holding Chambers DEVI 1 each by Does not apply route as needed. 06/13/22   [name], NP      Allergies    Sulfa  antibiotics and Bee venom    Review of Systems   Review of Systems  Physical Exam Updated Vital Signs BP (!) 127/59   Pulse 63   Temp 98.1 F (36.7 C) (Oral)   Resp 18   Ht 5' 6 (1.676 m)   Wt 54.9 kg   SpO2 97%   BMI 19.53 kg/m  Physical Exam Vitals and nursing note reviewed.  Constitutional:      General: He is not in acute distress.    Appearance: He is well-developed.  HENT:     Head: Normocephalic and atraumatic.  Eyes:     Conjunctiva/sclera: Conjunctivae normal.  Cardiovascular:     Rate and Rhythm: Normal rate and  regular rhythm.  Pulmonary:     Effort: Pulmonary effort is normal. No respiratory distress.     Breath sounds: No stridor.  Abdominal:     General: There is no distension.     Tenderness: There is abdominal tenderness in the right upper quadrant.    Skin:    General: Skin is warm and dry.  Neurological:     Mental Status: He is alert and oriented to person, place, and time.     ED Results / Procedures / Treatments   Labs (all labs ordered are listed, but only abnormal results are displayed) Labs Reviewed  COMPREHENSIVE METABOLIC PANEL - Abnormal; Notable for the following components:      Result Value   Creatinine, Ser 0.52 (*)    Anion gap 4 (*)    All other components within normal limits  CBC WITH  DIFFERENTIAL/PLATELET - Abnormal; Notable for the following components:   WBC 11.0 (*)    RBC 4.08 (*)    Hemoglobin 12.5 (*)    All other components within normal limits  CBC  COMPREHENSIVE METABOLIC PANEL  I-STAT CG4 LACTIC ACID, ED  I-STAT CG4 LACTIC ACID, ED    EKG None  Radiology CT ABDOMEN PELVIS W CONTRAST Result Date: 02/10/2023 CLINICAL DATA:  Worsening right abdominal pain. Acute cholecystitis. Percutaneous cholecystostomy. EXAM: CT ABDOMEN AND PELVIS WITH CONTRAST TECHNIQUE: Multidetector CT imaging of the abdomen and pelvis was performed using the standard protocol following bolus administration of intravenous contrast. RADIATION DOSE REDUCTION: This exam was performed according to the departmental dose-optimization program which includes automated exposure control, adjustment of the mA and/or kV according to patient size and/or use of iterative reconstruction technique. CONTRAST:  OMNIPAQUE  IOHEXOL  300 MG/ML  SOLN COMPARISON:  01/09/2023 FINDINGS: Lower Chest: No acute findings. Hepatobiliary: Several tiny hepatic cysts remains stable. No evidence of hepatic mass or abscess. Percutaneous cholecystostomy tube is seen in appropriate position. Changes of acute cholecystitis show no significant change mild diffuse biliary ductal dilatation remains stable. Pancreas: No mass or inflammatory changes. No evidence of pancreatic ductal dilatation. Spleen: Within normal limits in size and appearance. Adrenals/Urinary Tract: Horseshoe kidney again noted. No suspicious masses identified. No evidence of ureteral calculi or hydronephrosis. Unremarkable unopacified urinary bladder. Stomach/Bowel: No evidence of obstruction, inflammatory process or abnormal fluid collections. Vascular/Lymphatic: No pathologically enlarged lymph nodes. No acute vascular findings. Reproductive:  Stable mildly enlarged prostate. Other:  None. Musculoskeletal: No suspicious bone lesions identified. 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 IMPRESSION: Percutaneous cholecystostomy tube in appropriate position. Acute cholecystitis, otherwise not significantly changed in appearance. Stable mild diffuse biliary ductal dilatation. Horseshoe kidney. Stable mildly enlarged prostate. Electronically Signed   By: Norleen DELENA Kil M.D.   On: 02/10/2023 15:39    Procedures Procedures    Medications Ordered in ED Medications  piperacillin -tazobactam (ZOSYN ) IVPB 3.375 g (3.375 g Intravenous New Bag/Given 02/10/23 1730)  acetaminophen  (TYLENOL ) tablet 650 mg (has no administration in time range)    Or  acetaminophen  (TYLENOL ) suppository 650 mg (has no administration in time range)  ondansetron  (ZOFRAN ) tablet 4 mg (has no administration in time range)    Or  ondansetron  (ZOFRAN ) injection 4 mg (has no administration in time range)  HYDROmorphone  (DILAUDID ) injection 1 mg (1 mg Intravenous Given 02/10/23 1423)  iohexol  (OMNIPAQUE ) 300 MG/ML solution 100 mL (100 mLs Intravenous Contrast Given 02/10/23 1509)    ED Course/ Medical Decision Making/ A&P  Medical Decision Making Adult male with recent acute cholecystitis treated with percutaneous drain, antibiotics presents with pain in the area, concern for infection versus dislodged drain.  Patient not febrile, tachycardic or hypotensive, little early evidence for bacteremia, sepsis.  I discussed case with our radiology team, CT scan ordered.  Amount and/or Complexity of Data Reviewed Independent Historian: spouse External Data Reviewed: notes.    Details: Notes per radiology included below from last month Labs: ordered. Decision-making details documented in ED Course. Radiology: ordered and independent interpretation performed. Decision-making details documented in ED Course.  Risk Prescription drug management. Decision regarding hospitalization. Diagnosis or treatment significantly  limited by social determinants of health.  Plan: Continue TID flushes with 5 cc NS. Record output Q shift. Dressing changes QD or PRN if soiled.  Call IR APP or on call IR MD if difficulty flushing or sudden change in drain output.  Repeat imaging/possible drain injection once output < 10 mL/QD (excluding flush material). Consideration for drain removal if output is < 10 mL/QD (excluding flush material), pending discussion with the providing surgical service.   Discharge planning: Patient will be reached by IR schedulers to set up the appointment for cholangiogram and perc chole exchange in 6-8 weeks.  Saline flush sent to [facility], patient was asked to pick up the flush tomorrow.   5:53 PM Patient in similar condition on repeat exam.  Labs discussed, including leukocytosis, CT discussed, including evidence for persistent inflammation around his gallbladder.  I have discussed his case with our general surgery and interventional radiology teams.  With concern for persistent pain, leukocytosis, patient will be admitted with assistance from our surgery colleagues, but admission to the internal medicine team.  No early evidence for bacteremia, sepsis, no evidence of peritonitis with no other abdominal pain.        Final Clinical Impression(s) / ED Diagnoses Final diagnoses:  Cholecystitis     Garrick Charleston, MD 02/10/23 1754

## 2023-02-10 NOTE — ED Triage Notes (Signed)
 Pt has drain in right side of abdomen to drain gallbladder, pt states pain at site and in abdomen, redness to drain insertion site, increased output from drain. Drain appears to have been pulled out some

## 2023-02-10 NOTE — H&P (Signed)
 History and Physical    Patient: Aaron Stevens FMW:[id number] DOB: [birth year] DOA: 02/10/2023 DOS: the patient was seen and examined on 02/10/2023 PCP: [name], PA  Patient coming from: Home  Chief Complaint:  Chief Complaint  Patient presents with   Abdominal Pain    Drain issues   HPI: Aaron Stevens is a 68 y.o. male with medical history significant of atherosclerosis, emphysema, history of pneumonia, right eye cataract who was admitted last month from 01/09/2023 until 01/14/2023 due to acute cholecystitis with cholelithiasis undergoing a percutaneous drain placement on 01/12/2023 with GI recommending that the patient will go EUS in the future.  He is returning today with complaints of right upper quadrant pain associated with increased output from his percutaneous cholecystostomy drain and mild erythema at insertion site.  His wife tried to flush the tube as per instruction with no success.  He has been tolerating regular diet, except for hamburger meat if his pain is under control.  No nausea, emesis, diarrhea, constipation, melena or hematochezia.  No flank pain, dysuria, frequency or hematuria. He denied fever, chills, rhinorrhea, sore throat, wheezing or hemoptysis.  No chest pain, palpitations, diaphoresis, PND, orthopnea or pitting edema of the lower extremities.   No polyuria, polydipsia, polyphagia or blurred vision.   Lab work: Lactic acid x 2 was normal.  CBC is her white count 11.0, hemoglobin 12.5 g/dL platelets 737.  CMP is normal with the exception of a creatinine level 0.52 mg/dL.  Imaging: Percutaneous cholecystostomy tube in appropriate position.  Acute cholecystitis, otherwise not significantly changed in appearance.  Stable mild diffuse biliary ductal dilatation.  Horseshoe kidney.  Stable mildly enlarged prostate.   ED course: Initial vital signs were temperature 97.9 F, pulse 77, respiration 14, BP 138/68 mmHg O2 sat 97% on room air.  The patient  received hydromorphone  1 mg IVP and Zosyn  3.375 g IVPB.  Review of Systems: As mentioned in the history of present illness. All other systems reviewed and are negative. Past Medical History:  Diagnosis Date   Atherosclerosis of artery    Emphysema of lung (HCC)    Pneumonia    Past Surgical History:  Procedure Laterality Date   APPENDECTOMY     IR PERC CHOLECYSTOSTOMY  01/12/2023   IR SINUS/FIST TUBE CHK-NON GI  01/25/2023   KNEE SURGERY     Social History:  reports that he has been smoking cigarettes. He has never used smokeless tobacco. He reports that he does not drink alcohol and does not use drugs.  Allergies  Allergen Reactions   Sulfa  Antibiotics Anaphylaxis and Other (See Comments)   Bee Venom Other (See Comments)    History reviewed. No pertinent family history.  Prior to Admission medications   Medication Sig Start Date End Date Taking? Authorizing Provider  albuterol  (VENTOLIN  HFA) 108 (90 Base) MCG/ACT inhaler Inhale 2 puffs into the lungs every 6 (six) hours as needed for wheezing or shortness of breath. 01/14/23   Tobie Yetta HERO, MD  feeding supplement (ENSURE ENLIVE / ENSURE PLUS) LIQD Take 237 mLs by mouth 2 (two) times daily between meals. 01/14/23   Tobie Yetta HERO, MD  Fluticasone -Salmeterol (AIRDUO RESPICLICK 113/14) 113-14 MCG/ACT AEPB Inhale 1 Inhalation into the lungs in the morning and at bedtime. 01/26/23   Crain, Whitney L, PA  gabapentin  (NEURONTIN ) 300 MG capsule Take 300 mg by mouth at bedtime.    [provider]  methocarbamol  (ROBAXIN ) 500 MG tablet Take 1 tablet (500 mg total) by  mouth 3 (three) times daily. 01/14/23   Tobie Yetta HERO, MD  nicotine  (NICODERM CQ  - DOSED IN MG/24 HOURS) 21 mg/24hr patch Place 1 patch (21 mg total) onto the skin daily. 01/14/23 01/14/24  Tobie Yetta HERO, MD  nicotine  polacrilex (NICORETTE ) 4 MG gum Take 1 each (4 mg total) by mouth as needed for smoking cessation. 01/14/23   Tobie Yetta HERO, MD  OVER THE  COUNTER MEDICATION Take 1 Bar by mouth every other day. [facility] Candy    [provider]  oxyCODONE -acetaminophen  (PERCOCET) 5-325 MG tablet Take 1 tablet by mouth every 8 (eight) hours as needed for severe pain (pain score 7-10). 01/26/23 01/26/24  Crain, Whitney L, PA  Spacer/Aero-Holding Chambers DEVI 1 each by Does not apply route as needed. 06/13/22   [name], NP    Physical Exam: Vitals:   02/10/23 1305 02/10/23 1500 02/10/23 1715  BP: 138/68 (!) 127/59   Pulse: 77 63   Resp: 14 18   Temp: 97.9 F (36.6 C)  98.1 F (36.7 C)  TempSrc: Oral  Oral  SpO2: 97% 97%   Weight: 54.9 kg    Height: 5' 6 (1.676 m)     Physical Exam Vitals and nursing note reviewed.  Constitutional:      General: He is awake. He is not in acute distress.    Appearance: He is well-developed and normal weight. He is ill-appearing.  HENT:     Head: Normocephalic.     Nose: Rhinorrhea present.     Mouth/Throat:     Mouth: Mucous membranes are dry.  Eyes:     General: No scleral icterus.    Pupils: Pupils are equal, round, and reactive to light.  Neck:     Vascular: No JVD.  Cardiovascular:     Rate and Rhythm: Normal rate and regular rhythm.     Heart sounds: S1 normal and S2 normal.  Pulmonary:     Effort: Pulmonary effort is normal.     Breath sounds: Normal breath sounds. No wheezing, rhonchi or rales.  Abdominal:     General: Abdomen is flat. There is no distension.     Palpations: Abdomen is soft.     Tenderness: There is abdominal tenderness in the right upper quadrant.  Musculoskeletal:     Cervical back: Neck supple.     Right lower leg: No edema.     Left lower leg: No edema.  Skin:    General: Skin is warm and dry.  Neurological:     General: No focal deficit present.     Mental Status: He is alert and oriented to person, place, and time.  Psychiatric:        Mood and Affect: Mood normal.        Behavior: Behavior normal. Behavior is cooperative.     Data  Reviewed:  Results are pending, will review when available.  Assessment and Plan: Principal Problem:   Cholecystitis In the setting of:   Cholelithiasis  He is status post cholecystostomy tube placement. Observation/MedSurg. May have diet. Keep n.p.o. after midnight. Analgesics as needed. Antiemetics as needed. Continue Zosyn  3.375 g IVPB every 8 hours. Follow CBC, CMP in AM. [place] surgery has been consulted. Interventional radiology has been contacted as well. -CCS will make decision about cholecystostomy tube exchange or not.  Active Problems:   Emphysema of lung (HCC) Continue albuterol  MDI as needed.    Tobacco use Declined nicotine  replacement therapy. Stated he has been in  remission.    Normocytic anemia In the setting of persistent infection. Monitor hematocrit/hemoglobin.    Advance Care Planning:   Code Status: Full Code   Consults: CCS.  Family Communication:   Severity of Illness: The appropriate patient status for this patient is INPATIENT. Inpatient status is judged to be reasonable and necessary in order to provide the required intensity of service to ensure the patient's safety. The patient's presenting symptoms, physical exam findings, and initial radiographic and laboratory data in the context of their chronic comorbidities is felt to place them at high risk for further clinical deterioration. Furthermore, it is not anticipated that the patient will be medically stable for discharge from the hospital within 2 midnights of admission.   * I certify that at the point of admission it is my clinical judgment that the patient will require inpatient hospital care spanning beyond 2 midnights from the point of admission due to high intensity of service, high risk for further deterioration and high frequency of surveillance required.*  Author: Alm Dorn Castor, MD 02/10/2023 5:28 PM  For on call review [email or web address].   This document was prepared  using Dragon voice recognition software and may contain some unintended transcription errors.

## 2023-02-10 NOTE — Telephone Encounter (Signed)
 Pt was admitted to the hospital 02/10/23. Please reach out to him Monday 02/12/23 to see if this is still needed or if his medications were changed while inpatient (or if he is still inpatient, please advise). Thanks

## 2023-02-11 DIAGNOSIS — K819 Cholecystitis, unspecified: Secondary | ICD-10-CM | POA: Diagnosis not present

## 2023-02-11 LAB — COMPREHENSIVE METABOLIC PANEL
ALT: 30 U/L (ref 0–44)
AST: 28 U/L (ref 15–41)
Albumin: 3.4 g/dL — ABNORMAL LOW (ref 3.5–5.0)
Alkaline Phosphatase: 84 U/L (ref 38–126)
Anion gap: 7 (ref 5–15)
BUN: 15 mg/dL (ref 8–23)
CO2: 26 mmol/L (ref 22–32)
Calcium: 8.8 mg/dL — ABNORMAL LOW (ref 8.9–10.3)
Chloride: 101 mmol/L (ref 98–111)
Creatinine, Ser: 0.93 mg/dL (ref 0.61–1.24)
GFR, Estimated: >60 mL/min (ref >60)
Glucose, Bld: 95 mg/dL (ref 70–99)
Potassium: 4.7 mmol/L (ref 3.5–5.1)
Sodium: 134 mmol/L — ABNORMAL LOW (ref 135–145)
Total Bilirubin: 0.9 mg/dL (ref 0.0–1.2)
Total Protein: 6.2 g/dL — ABNORMAL LOW (ref 6.5–8.1)

## 2023-02-11 LAB — CBC
HCT: 36.1 % — ABNORMAL LOW (ref 39.0–52.0)
Hemoglobin: 12.3 g/dL — ABNORMAL LOW (ref 13.0–17.0)
MCH: 31.2 pg (ref 26.0–34.0)
MCHC: 34.1 g/dL (ref 30.0–36.0)
MCV: 91.6 fL (ref 80.0–100.0)
Platelets: 249 10*3/uL (ref 150–400)
RBC: 3.94 MIL/uL — ABNORMAL LOW (ref 4.22–5.81)
RDW: 14.5 % (ref 11.5–15.5)
WBC: 12.4 10*3/uL — ABNORMAL HIGH (ref 4.0–10.5)
nRBC: 0 % (ref 0.0–0.2)

## 2023-02-11 MED ORDER — PIPERACILLIN-TAZOBACTAM 3.375 G IVPB
3.3750 g | Freq: Three times a day (TID) | INTRAVENOUS | Status: AC
Start: 2023-02-11 — End: ?
  Administered 2023-02-11 – 2023-02-18 (×22): 3.375 g via INTRAVENOUS
  Filled 2023-02-11 (×22): qty 50

## 2023-02-11 NOTE — Consult Note (Signed)
 [facility] Consult  Referring Provider: Surgical team Primary Care Physician:  [name], [place] Primary Gastroenterologist: Sampson  Reason for Consultation: Requiring EUS with double duct sign  HPI: Aaron Stevens is a 68 y.o. male was in his usual state of health until yesterday when he developed severe right upper quadrant pain associated with nausea and vomiting. He had IR guided percutaneous drain placement on 01/12/2023 for acute cholecystitis and had recheck of drain on 01/25/2023. As per his wife, for the first 2 weeks he had about 40 cc of bile drainage every day, for the next 1 week thereafter it increased to 60 cc/day, thereafter 120 cc a day until yesterday when she tried flushing it but could not get more than 2 cc in for flush.  Patient complained of severe abdominal pain and has not had any drainage from the cholecystostomy tube and came to the ER. As per documentation 140 cc of biliary drainage has been documented from the cholecystostomy tube.  Patient had an MRI abdomen with and without contrast on MRCP on 01/10/2023 which showed small cyst and/or biliary hamartomas but mild intra and extrahepatic ductal dilatation with bile duct measuring up to 8 mm at porta hepatis without choledocholithiasis, numerous filling defects within gallbladder with mild diffuse dilation of main pancreatic duct up to 4 mm.  Because of presence of double duct sign it was recommended to have an endoscopic ultrasound.  He is a smoker, half pack per day for over 30 to 40 years. He denies use of alcohol. No prior EGD or colonoscopy. No change in bowel habits.  Past Medical History:  Diagnosis Date   Atherosclerosis of artery    Emphysema of lung (HCC)    Pneumonia     Past Surgical History:  Procedure Laterality Date   APPENDECTOMY     IR PERC CHOLECYSTOSTOMY  01/12/2023   IR SINUS/FIST TUBE CHK-NON GI  01/25/2023   KNEE SURGERY      Prior to Admission medications    Medication Sig Start Date End Date Taking? Authorizing Provider  acetaminophen  (TYLENOL ) 650 MG CR tablet Take 1,300 mg by mouth every 8 (eight) hours as needed for pain.   Yes [provider]  albuterol  (VENTOLIN  HFA) 108 (90 Base) MCG/ACT inhaler Inhale 2 puffs into the lungs every 6 (six) hours as needed for wheezing or shortness of breath. Patient taking differently: Inhale 1 puff into the lungs in the morning and at bedtime. 01/14/23  Yes Tobie Yetta HERO, MD  gabapentin  (NEURONTIN ) 300 MG capsule Take 900 mg by mouth at bedtime.   Yes [provider]  methocarbamol  (ROBAXIN ) 500 MG tablet Take 1 tablet (500 mg total) by mouth 3 (three) times daily. 01/14/23  Yes Tobie Yetta HERO, MD  nicotine  (NICODERM CQ  - DOSED IN MG/24 HOURS) 21 mg/24hr patch Place 1 patch (21 mg total) onto the skin daily. 01/14/23 01/14/24 Yes Tobie Yetta HERO, MD  oxyCODONE -acetaminophen  (PERCOCET) 5-325 MG tablet Take 1 tablet by mouth every 8 (eight) hours as needed for severe pain (pain score 7-10). 01/26/23 01/26/24 Yes Crain, Whitney L, PA  OVER THE COUNTER MEDICATION Take 1 Bar by mouth daily as needed. [facility] Candy Patient not taking: Reported on 02/10/2023    [provider]  Spacer/Aero-Holding Chambers DEVI 1 each by Does not apply route as needed. 06/13/22   [name], NP    Current Facility-Administered Medications  Medication Dose Route Frequency Provider Last Rate Last Admin   acetaminophen  (TYLENOL ) tablet  650 mg  650 mg Oral Q6H PRN Celinda Alm Lot, MD       Or   acetaminophen  (TYLENOL ) suppository 650 mg  650 mg Rectal Q6H PRN Celinda Alm Lot, MD       albuterol  (PROVENTIL ) (2.5 MG/3ML) 0.083% nebulizer solution 2.5 mg  2.5 mg Nebulization Q6H PRN James, Melissa, RPH   2.5 mg at 02/10/23 2146   gabapentin  (NEURONTIN ) capsule 300 mg  300 mg Oral QHS Celinda Alm Lot, MD   300 mg at 02/10/23 2025   HYDROmorphone  (DILAUDID ) injection 0.5 mg  0.5 mg Intravenous Q2H  PRN Celinda Alm Lot, MD       methocarbamol  (ROBAXIN ) tablet 500 mg  500 mg Oral TID Celinda Alm Lot, MD   500 mg at 02/11/23 9787   mometasone -formoterol  (DULERA ) 100-5 MCG/ACT inhaler 2 puff  2 puff Inhalation BID Lynwood Setter, RPH   2 puff at 02/11/23 9094   ondansetron  (ZOFRAN ) tablet 4 mg  4 mg Oral Q6H PRN Celinda Alm Lot, MD       Or   ondansetron  (ZOFRAN ) injection 4 mg  4 mg Intravenous Q6H PRN Celinda Alm Lot, MD   4 mg at 02/11/23 1249   oxyCODONE  (Oxy IR/ROXICODONE ) immediate release tablet 5 mg  5 mg Oral Q6H PRN Celinda Alm Lot, MD   5 mg at 02/11/23 0211   piperacillin -tazobactam (ZOSYN ) IVPB 3.375 g  3.375 g Intravenous Q8H Kc, Ramesh, MD 12.5 mL/hr at 02/11/23 0930 3.375 g at 02/11/23 0930    Allergies as of 02/10/2023 - Review Complete 02/10/2023  Allergen Reaction Noted   Sulfa  antibiotics Anaphylaxis and Other (See Comments) 02/25/2012   Bee venom Other (See Comments) 04/26/2015    History reviewed. No pertinent family history.  Social History   Socioeconomic History   Marital status: Married    Spouse name: Not on file   Number of children: Not on file   Years of education: Not on file   Highest education level: Not on file  Occupational History   Not on file  Tobacco Use   Smoking status: Every Day    Current packs/day: 0.50    Types: Cigarettes   Smokeless tobacco: Never  Vaping Use   Vaping status: Never Used  Substance and Sexual Activity   Alcohol use: No   Drug use: No   Sexual activity: Not Currently    Partners: Female  Other Topics Concern   Not on file  Social History Narrative   ** Merged History Encounter **       Social Drivers of Health   Financial Resource Strain: Not on file  Food Insecurity: No Food Insecurity (02/11/2023)   Hunger Vital Sign    Worried About Running Out of Food in the Last Year: Never true    Ran Out of Food in the Last Year: Never true  Transportation Needs: No Transportation Needs  (02/11/2023)   PRAPARE - Administrator, Civil Service (Medical): No    Lack of Transportation (Non-Medical): No  Physical Activity: Not on file  Stress: Not on file  Social Connections: Moderately Integrated (02/11/2023)   Social Connection and Isolation Panel [NHANES]    Frequency of Communication with Friends and Family: More than three times a week    Frequency of Social Gatherings with Friends and Family: Twice a week    Attends Religious Services: 1 to 4 times per year    Active Member of Clubs or Organizations: No  Attends Banker Meetings: Never    Marital Status: Married  Catering Manager Violence: Not At Risk (02/11/2023)   Humiliation, Afraid, Rape, and Kick questionnaire    Fear of Current or Ex-Partner: No    Emotionally Abused: No    Physically Abused: No    Sexually Abused: No    Review of Systems: As per HPI Physical Exam: Vital signs in last 24 hours: Temp:  [97.7 F (36.5 C)-99.5 F (37.5 C)] 98.6 F (37 C) (01/12 0945) Pulse Rate:  [63-99] 87 (01/12 0945) Resp:  [15-18] 16 (01/12 0945) BP: (104-156)/(58-78) 106/63 (01/12 0945) SpO2:  [92 %-99 %] 94 % (01/12 0945)    General:   Alert,  Well-developed, thin, pleasant and cooperative in NAD Head:  Normocephalic and atraumatic. Eyes:  Sclera clear, no icterus.   Conjunctiva pink. Ears:  Normal auditory acuity. Nose:  No deformity, discharge,  or lesions. Mouth:  No deformity or lesions.  Oropharynx pink & moist. Neck:  Supple; no masses or thyromegaly. Lungs:  Clear throughout to auscultation.   No wheezes, crackles, or rhonchi. No acute distress. Heart:  Regular rate and rhythm; no murmurs, clicks, rubs,  or gallops. Extremities:  Without clubbing or edema. Neurologic:  Alert and  oriented x4;  grossly normal neurologically. Skin:  Intact without significant lesions or rashes. Psych:  Alert and cooperative. Normal mood and affect. Abdomen: Cholecystostomy drain present, bag empty,  mild right upper quadrant tenderness        Lab Results: Recent Labs    02/10/23 1341 02/11/23 0527  WBC 11.0* 12.4*  HGB 12.5* 12.3*  HCT 39.0 36.1*  PLT 262 249   BMET Recent Labs    02/10/23 1341 02/11/23 0527  NA 135 134*  K 4.8 4.7  CL 104 101  CO2 27 26  GLUCOSE 82 95  BUN 16 15  CREATININE 0.52* 0.93  CALCIUM 8.9 8.8*   LFT Recent Labs    02/11/23 0527  PROT 6.2*  ALBUMIN 3.4*  AST 28  ALT 30  ALKPHOS 84  BILITOT 0.9   PT/INR No results for input(s): LABPROT, INR in the last 72 hours.  Studies/Results: CT ABDOMEN PELVIS W CONTRAST Result Date: 02/10/2023 CLINICAL DATA:  Worsening right abdominal pain. Acute cholecystitis. Percutaneous cholecystostomy. EXAM: CT ABDOMEN AND PELVIS WITH CONTRAST TECHNIQUE: Multidetector CT imaging of the abdomen and pelvis was performed using the standard protocol following bolus administration of intravenous contrast. RADIATION DOSE REDUCTION: This exam was performed according to the departmental dose-optimization program which includes automated exposure control, adjustment of the mA and/or kV according to patient size and/or use of iterative reconstruction technique. CONTRAST:  OMNIPAQUE  IOHEXOL  300 MG/ML  SOLN COMPARISON:  01/09/2023 FINDINGS: Lower Chest: No acute findings. Hepatobiliary: Several tiny hepatic cysts remains stable. No evidence of hepatic mass or abscess. Percutaneous cholecystostomy tube is seen in appropriate position. Changes of acute cholecystitis show no significant change mild diffuse biliary ductal dilatation remains stable. Pancreas: No mass or inflammatory changes. No evidence of pancreatic ductal dilatation. Spleen: Within normal limits in size and appearance. Adrenals/Urinary Tract: Horseshoe kidney again noted. No suspicious masses identified. No evidence of ureteral calculi or hydronephrosis. Unremarkable unopacified urinary bladder. Stomach/Bowel: No evidence of obstruction, inflammatory  process or abnormal fluid collections. Vascular/Lymphatic: No pathologically enlarged lymph nodes. No acute vascular findings. Reproductive:  Stable mildly enlarged prostate. Other:  None. Musculoskeletal: No suspicious bone lesions identified. 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  IMPRESSION: Percutaneous cholecystostomy tube in appropriate position. Acute cholecystitis, otherwise not significantly changed in appearance. Stable mild diffuse biliary ductal dilatation. Horseshoe kidney. Stable mildly enlarged prostate. Electronically Signed   By: Norleen DELENA Kil M.D.   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 Patient originally scheduled for an office visit with Dr. Burnette on 02/20/2023 with possible EUS scheduled in February 2025. Will send a message to Dr. Burnette to see if there is any earlier availability for EUS for evaluation of double duct sign.   LOS: 1 day   Estelita Manas, MD  02/11/2023, 1:14 PM

## 2023-02-11 NOTE — Plan of Care (Signed)
   Problem: Elimination: Goal: Will not experience complications related to bowel motility Outcome: Progressing

## 2023-02-11 NOTE — H&P (View-Only) (Signed)
 Aaron Stevens  Referring Provider: Surgical team Primary Care Physician:  Aaron Stevens, [place] Primary Gastroenterologist: Aaron Stevens  Reason for Consultation: Requiring EUS with double duct sign  HPI: Aaron Stevens is a 68 y.o. male was in his usual state of health until yesterday when he developed severe right upper quadrant pain associated with nausea and vomiting. He had IR guided percutaneous drain placement on 01/12/2023 for acute cholecystitis and had recheck of drain on 01/25/2023. As per his wife, for the first 2 weeks he had about 40 cc of bile drainage every day, for the next 1 week thereafter it increased to 60 cc/day, thereafter 120 cc a day until yesterday when she tried flushing it but could not get more than 2 cc in for flush.  Patient complained of severe abdominal pain and has not had any drainage from the cholecystostomy tube and came to the ER. As per documentation 140 cc of biliary drainage has been documented from the cholecystostomy tube.  Patient had an MRI abdomen with and without contrast on MRCP on 01/10/2023 which showed small cyst and/or biliary hamartomas but mild intra and extrahepatic ductal dilatation with bile duct measuring up to 8 mm at porta hepatis without choledocholithiasis, numerous filling defects within gallbladder with mild diffuse dilation of main pancreatic duct up to 4 mm.  Because of presence of double duct sign it was recommended to have an endoscopic ultrasound.  He is a smoker, half pack per day for over 30 to 40 years. He denies use of alcohol. No prior EGD or colonoscopy. No change in bowel habits.  Past Medical History:  Diagnosis Date   Atherosclerosis of artery    Emphysema of lung (HCC)    Pneumonia     Past Surgical History:  Procedure Laterality Date   APPENDECTOMY     IR PERC CHOLECYSTOSTOMY  01/12/2023   IR SINUS/FIST TUBE CHK-NON GI  01/25/2023   KNEE SURGERY      Prior to Admission medications    Medication Sig Start Date End Date Taking? Authorizing Provider  acetaminophen  (TYLENOL ) 650 MG CR tablet Take 1,300 mg by mouth every 8 (eight) hours as needed for pain.   Yes [provider]  albuterol  (VENTOLIN  HFA) 108 (90 Base) MCG/ACT inhaler Inhale 2 puffs into the lungs every 6 (six) hours as needed for wheezing or shortness of breath. Patient taking differently: Inhale 1 puff into the lungs in the morning and at bedtime. 01/14/23  Yes Tobie Yetta HERO, MD  gabapentin  (NEURONTIN ) 300 MG capsule Take 900 mg by mouth at bedtime.   Yes [provider]  methocarbamol  (ROBAXIN ) 500 MG tablet Take 1 tablet (500 mg total) by mouth 3 (three) times daily. 01/14/23  Yes Tobie Yetta HERO, MD  nicotine  (NICODERM CQ  - DOSED IN MG/24 HOURS) 21 mg/24hr patch Place 1 patch (21 mg total) onto the skin daily. 01/14/23 01/14/24 Yes Tobie Yetta HERO, MD  oxyCODONE -acetaminophen  (PERCOCET) 5-325 MG tablet Take 1 tablet by mouth every 8 (eight) hours as needed for severe pain (pain score 7-10). 01/26/23 01/26/24 Yes Crain, Whitney L, PA  OVER THE COUNTER MEDICATION Take 1 Bar by mouth daily as needed. [facility] Candy Patient not taking: Reported on 02/10/2023    [provider]  Spacer/Aero-Holding Chambers DEVI 1 each by Does not apply route as needed. 06/13/22   [name], NP    Current Facility-Administered Medications  Medication Dose Route Frequency Provider Last Rate Last Admin   acetaminophen  (TYLENOL ) tablet  650 mg  650 mg Oral Q6H PRN Celinda Alm Lot, MD       Or   acetaminophen  (TYLENOL ) suppository 650 mg  650 mg Rectal Q6H PRN Celinda Alm Lot, MD       albuterol  (PROVENTIL ) (2.5 MG/3ML) 0.083% nebulizer solution 2.5 mg  2.5 mg Nebulization Q6H PRN James, Melissa, RPH   2.5 mg at 02/10/23 2146   gabapentin  (NEURONTIN ) capsule 300 mg  300 mg Oral QHS Celinda Alm Lot, MD   300 mg at 02/10/23 2025   HYDROmorphone  (DILAUDID ) injection 0.5 mg  0.5 mg Intravenous Q2H  PRN Celinda Alm Lot, MD       methocarbamol  (ROBAXIN ) tablet 500 mg  500 mg Oral TID Celinda Alm Lot, MD   500 mg at 02/11/23 9787   mometasone -formoterol  (DULERA ) 100-5 MCG/ACT inhaler 2 puff  2 puff Inhalation BID Lynwood Setter, RPH   2 puff at 02/11/23 9094   ondansetron  (ZOFRAN ) tablet 4 mg  4 mg Oral Q6H PRN Celinda Alm Lot, MD       Or   ondansetron  (ZOFRAN ) injection 4 mg  4 mg Intravenous Q6H PRN Celinda Alm Lot, MD   4 mg at 02/11/23 1249   oxyCODONE  (Oxy IR/ROXICODONE ) immediate release tablet 5 mg  5 mg Oral Q6H PRN Celinda Alm Lot, MD   5 mg at 02/11/23 0211   piperacillin -tazobactam (ZOSYN ) IVPB 3.375 g  3.375 g Intravenous Q8H Kc, Ramesh, MD 12.5 mL/hr at 02/11/23 0930 3.375 g at 02/11/23 0930    Allergies as of 02/10/2023 - Review Complete 02/10/2023  Allergen Reaction Noted   Sulfa  antibiotics Anaphylaxis and Other (See Comments) 02/25/2012   Bee venom Other (See Comments) 04/26/2015    History reviewed. No pertinent family history.  Social History   Socioeconomic History   Marital status: Married    Spouse name: Not on file   Number of children: Not on file   Years of education: Not on file   Highest education level: Not on file  Occupational History   Not on file  Tobacco Use   Smoking status: Every Day    Current packs/day: 0.50    Types: Cigarettes   Smokeless tobacco: Never  Vaping Use   Vaping status: Never Used  Substance and Sexual Activity   Alcohol use: No   Drug use: No   Sexual activity: Not Currently    Partners: Female  Other Topics Concern   Not on file  Social History Narrative   ** Merged History Encounter **       Social Drivers of Health   Financial Resource Strain: Not on file  Food Insecurity: No Food Insecurity (02/11/2023)   Hunger Vital Sign    Worried About Running Out of Food in the Last Year: Never true    Ran Out of Food in the Last Year: Never true  Transportation Needs: No Transportation Needs  (02/11/2023)   PRAPARE - Administrator, Civil Service (Medical): No    Lack of Transportation (Non-Medical): No  Physical Activity: Not on file  Stress: Not on file  Social Connections: Moderately Integrated (02/11/2023)   Social Connection and Isolation Panel [NHANES]    Frequency of Communication with Friends and Family: More than three times a week    Frequency of Social Gatherings with Friends and Family: Twice a week    Attends Religious Services: 1 to 4 times per year    Active Member of Clubs or Organizations: No  Attends Banker Meetings: Never    Marital Status: Married  Catering Manager Violence: Not At Risk (02/11/2023)   Humiliation, Afraid, Rape, and Kick questionnaire    Fear of Current or Ex-Partner: No    Emotionally Abused: No    Physically Abused: No    Sexually Abused: No    Review of Systems: As per HPI Physical Exam: Vital signs in last 24 hours: Temp:  [97.7 F (36.5 C)-99.5 F (37.5 C)] 98.6 F (37 C) (01/12 0945) Pulse Rate:  [63-99] 87 (01/12 0945) Resp:  [15-18] 16 (01/12 0945) BP: (104-156)/(58-78) 106/63 (01/12 0945) SpO2:  [92 %-99 %] 94 % (01/12 0945)    General:   Alert,  Well-developed, thin, pleasant and cooperative in NAD Head:  Normocephalic and atraumatic. Eyes:  Sclera clear, no icterus.   Conjunctiva pink. Ears:  Normal auditory acuity. Nose:  No deformity, discharge,  or lesions. Mouth:  No deformity or lesions.  Oropharynx pink & moist. Neck:  Supple; no masses or thyromegaly. Lungs:  Clear throughout to auscultation.   No wheezes, crackles, or rhonchi. No acute distress. Heart:  Regular rate and rhythm; no murmurs, clicks, rubs,  or gallops. Extremities:  Without clubbing or edema. Neurologic:  Alert and  oriented x4;  grossly normal neurologically. Skin:  Intact without significant lesions or rashes. Psych:  Alert and cooperative. Normal mood and affect. Abdomen: Cholecystostomy drain present, bag empty,  mild right upper quadrant tenderness        Lab Results: Recent Labs    02/10/23 1341 02/11/23 0527  WBC 11.0* 12.4*  HGB 12.5* 12.3*  HCT 39.0 36.1*  PLT 262 249   BMET Recent Labs    02/10/23 1341 02/11/23 0527  NA 135 134*  K 4.8 4.7  CL 104 101  CO2 27 26  GLUCOSE 82 95  BUN 16 15  CREATININE 0.52* 0.93  CALCIUM 8.9 8.8*   LFT Recent Labs    02/11/23 0527  PROT 6.2*  ALBUMIN 3.4*  AST 28  ALT 30  ALKPHOS 84  BILITOT 0.9   PT/INR No results for input(s): LABPROT, INR in the last 72 hours.  Studies/Results: CT ABDOMEN PELVIS W CONTRAST Result Date: 02/10/2023 CLINICAL DATA:  Worsening right abdominal pain. Acute cholecystitis. Percutaneous cholecystostomy. EXAM: CT ABDOMEN AND PELVIS WITH CONTRAST TECHNIQUE: Multidetector CT imaging of the abdomen and pelvis was performed using the standard protocol following bolus administration of intravenous contrast. RADIATION DOSE REDUCTION: This exam was performed according to the departmental dose-optimization program which includes automated exposure control, adjustment of the mA and/or kV according to patient size and/or use of iterative reconstruction technique. CONTRAST:  OMNIPAQUE  IOHEXOL  300 MG/ML  SOLN COMPARISON:  01/09/2023 FINDINGS: Lower Chest: No acute findings. Hepatobiliary: Several tiny hepatic cysts remains stable. No evidence of hepatic mass or abscess. Percutaneous cholecystostomy tube is seen in appropriate position. Changes of acute cholecystitis show no significant change mild diffuse biliary ductal dilatation remains stable. Pancreas: No mass or inflammatory changes. No evidence of pancreatic ductal dilatation. Spleen: Within normal limits in size and appearance. Adrenals/Urinary Tract: Horseshoe kidney again noted. No suspicious masses identified. No evidence of ureteral calculi or hydronephrosis. Unremarkable unopacified urinary bladder. Stomach/Bowel: No evidence of obstruction, inflammatory  process or abnormal fluid collections. Vascular/Lymphatic: No pathologically enlarged lymph nodes. No acute vascular findings. Reproductive:  Stable mildly enlarged prostate. Other:  None. Musculoskeletal: No suspicious bone lesions identified. 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  IMPRESSION: Percutaneous cholecystostomy tube in appropriate position. Acute cholecystitis, otherwise not significantly changed in appearance. Stable mild diffuse biliary ductal dilatation. Horseshoe kidney. Stable mildly enlarged prostate. Electronically Signed   By: Norleen DELENA Kil M.D.   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 Patient originally scheduled for an office visit with Dr. Burnette on 02/20/2023 with possible EUS scheduled in February 2025. Will send a message to Dr. Burnette to see if there is any earlier availability for EUS for evaluation of double duct sign.   LOS: 1 day   Estelita Manas, MD  02/11/2023, 1:14 PM

## 2023-02-11 NOTE — Progress Notes (Signed)
 PROGRESS NOTE Aaron Stevens  FMW:[id number] DOB: 12-12-1955 DOA: 02/10/2023 PCP: [name], PA  Brief Narrative/Hospital Course: 68 y.o. male with medical history significant of atherosclerosis, emphysema, history of pneumonia, right eye cataract who was admitted last month from 01/09/2023 until 01/14/2023 due to acute cholecystitis with cholelithiasis undergoing a percutaneous drain placement on 01/12/2023 with GI recommending that the patient will go EUS in the future but he returned 02/10/23 with complaints of right upper quadrant pain associated with increased output from his percutaneous cholecystostomy drain and mild erythema at insertion site and his wife unable to flush the tube. In the ED hemodynamically stable afebrile. Labs with a stable CMP, mild leukocytosis 11 normal lactic acid.    Subjective: Patient seen and examined On the bedside chair alert awake pain is controlled no new complaints Having BM last 1/12 point no nausea vomiting   Assessment and Plan: Principal Problem:   Cholecystitis Active Problems:   Emphysema of lung (HCC)   Tobacco use   Normocytic anemia   Cholelithiasis   Cholecystitis status post percutaneous cholecystostomy: CT showing drain in position but not sure functional surgery following and planning for IR to evaluate-possible drain injection.  Overall doing well, continue pain control with  PRN Tylenol /oxycodone  and  scheduled Robaxin , has mild leukocytosis-pharmacy consulted to dose ZOSYN   Ampullary mass: CCS monitoring they are planning to have [facility] see the patient  Emphysema of lung: Continue albuterol  prn  Normocytic anemia: In the setting of chronic illness infection monitor hemoglobin  DVT prophylaxis: SCDs Start: 02/10/23 1741 Code Status:   Code Status: Full Code Family Communication: plan of care discussed with patient at bedside. Patient status is: Remains hospitalized because of severity of illness Level of care:  Med-Surg   Dispo: The patient is from: HOME W/ wife            Anticipated disposition: TBD Objective: Vitals last 24 hrs: Vitals:   02/10/23 1909 02/10/23 2039 02/11/23 0040 02/11/23 0602  BP: (!) 156/78 132/72 104/63 (!) 108/58  Pulse: 79 88 80 99  Resp: 16 17 17 15   Temp: 97.7 F (36.5 C) 98.1 F (36.7 C) 98.8 F (37.1 C) 99.5 F (37.5 C)  TempSrc: Oral Oral Oral Oral  SpO2: 99% 97% 95% 92%  Weight:      Height:       Weight change:   Physical Examination: General exam: alert awake,at baseline, older than stated age HEENT:Oral mucosa moist, Ear/Nose WNL grossly Respiratory system: Bilaterally clear BS,no use of accessory muscle Cardiovascular system: S1 & S2 +, No JVD. Gastrointestinal system: Abdomen soft, no tenderness, ruq gb  Drain +_ w/ no output , BS+ Nervous System: Alert, awake, moving all extremities,and following commands. Extremities: LE edema neg,distal peripheral pulses palpable and warm.  Skin: No rashes,no icterus. MSK: Normal muscle bulk,tone, power   Medications reviewed:  Scheduled Meds:  gabapentin   300 mg Oral QHS   methocarbamol   500 mg Oral TID   mometasone -formoterol   2 puff Inhalation BID   Continuous Infusions:  piperacillin -tazobactam (ZOSYN )  IV        Diet Order             Diet NPO time specified  Diet effective midnight                            Intake/Output Summary (Last 24 hours) at 02/11/2023 0854 Last data filed at 02/11/2023 0600 Gross per 24 hour  Intake 60 ml  Output 140 ml  Net -80 ml   Net IO Since Admission: -80 mL [02/11/23 0854]  Wt Readings from Last 3 Encounters:  02/10/23 54.9 kg  01/26/23 54.9 kg  01/09/23 55.7 kg     Unresulted Labs (From admission, onward)    None     Data Reviewed: I have personally reviewed following labs and imaging studies CBC: Recent Labs  Lab 02/10/23 1341 02/11/23 0527  WBC 11.0* 12.4*  NEUTROABS 6.6  --   HGB 12.5* 12.3*  HCT 39.0 36.1*  MCV 95.6 91.6   PLT 262 249   Basic Metabolic Panel:  Recent Labs  Lab 02/10/23 1341 02/11/23 0527  NA 135 134*  K 4.8 4.7  CL 104 101  CO2 27 26  GLUCOSE 82 95  BUN 16 15  CREATININE 0.52* 0.93  CALCIUM 8.9 8.8*   GFR: Estimated Creatinine Clearance: 59 mL/min (by C-G formula based on SCr of 0.93 mg/dL). Liver Function Tests:  Recent Labs  Lab 02/10/23 1341 02/11/23 0527  AST 32 28  ALT 30 30  ALKPHOS 74 84  BILITOT 1.1 0.9  PROT 6.7 6.2*  ALBUMIN 3.6 3.4*   Recent Labs  Lab 02/10/23 1349 02/10/23 1721  LATICACIDVEN 0.5 0.8   No results found for this or any previous visit (from the past 240 hours).  Antimicrobials/Microbiology: Anti-infectives (From admission, onward)    Start     Dose/Rate Route Frequency Ordered Stop   02/11/23 0900  piperacillin -tazobactam (ZOSYN ) IVPB 3.375 g        3.375 g 12.5 mL/hr over 240 Minutes Intravenous Every 8 hours 02/11/23 0815     02/10/23 1730  piperacillin -tazobactam (ZOSYN ) IVPB 3.375 g        3.375 g 12.5 mL/hr over 240 Minutes Intravenous  Once 02/10/23 1716 02/10/23 2130         Component Value Date/Time   SDES  01/12/2023 1359    BILE DRAIN Performed at [facility], [address] [place], [place] [address]    SPECREQUEST  01/12/2023 1359    NONE Performed at [facility], [address], [place], [place] [address]    CULT  01/12/2023 1359    No growth aerobically or anaerobically. Performed at [facility], [address], [place], [place] [address]    REPTSTATUS 01/17/2023 FINAL 01/12/2023 1359     Radiology Studies: CT ABDOMEN PELVIS W CONTRAST Result Date: 02/10/2023 CLINICAL DATA:  Worsening right abdominal pain. Acute cholecystitis. Percutaneous cholecystostomy. EXAM: CT ABDOMEN AND PELVIS WITH CONTRAST TECHNIQUE: Multidetector CT imaging of the abdomen and pelvis was performed using the standard protocol following bolus administration of intravenous contrast. RADIATION  DOSE REDUCTION: This exam was performed according to the departmental dose-optimization program which includes automated exposure control, adjustment of the mA and/or kV according to patient size and/or use of iterative reconstruction technique. CONTRAST:  OMNIPAQUE  IOHEXOL  300 MG/ML  SOLN COMPARISON:  01/09/2023 FINDINGS: Lower Chest: No acute findings. Hepatobiliary: Several tiny hepatic cysts remains stable. No evidence of hepatic mass or abscess. Percutaneous cholecystostomy tube is seen in appropriate position. Changes of acute cholecystitis show no significant change mild diffuse biliary ductal dilatation remains stable. Pancreas: No mass or inflammatory changes. No evidence of pancreatic ductal dilatation. Spleen: Within normal limits in size and appearance. Adrenals/Urinary Tract: Horseshoe kidney again noted. No suspicious masses identified. No evidence of ureteral calculi or hydronephrosis. Unremarkable unopacified urinary bladder. Stomach/Bowel: No evidence of obstruction, inflammatory  process or abnormal fluid collections. Vascular/Lymphatic: No pathologically enlarged lymph nodes. No acute vascular findings. Reproductive:  Stable mildly enlarged prostate. Other:  None. Musculoskeletal: No suspicious bone lesions identified. 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 IMPRESSION: Percutaneous cholecystostomy tube in appropriate position. Acute cholecystitis, otherwise not significantly changed in appearance. Stable mild diffuse biliary ductal dilatation. Horseshoe kidney. Stable mildly enlarged prostate. Electronically Signed   By: Norleen DELENA Kil M.D.   On: 02/10/2023 15:39   LOS: 1 day   Total time spent in review of labs and imaging, patient evaluation, formulation of plan, documentation and communication with family: 35 minutes  Mennie LAMY, MD [facility]  02/11/2023, 8:54 AM

## 2023-02-11 NOTE — Progress Notes (Signed)
 Subjective/Chief Complaint: Known to our service from recent admission with perc chole.  Had been doing ok at home, tol diet, still having some pain. Drain had been putting out between 40-120 per day.  Not putting out as much now and hurt when his wife tried to flush yesterday and was only able to push 2 cc.  Returned with elevated wbc and ct with stranding.  Still has mild double duct sign-has an EUS but this is not until 2/19   Objective: Vital signs in last 24 hours: Temp:  [97.7 F (36.5 C)-99.5 F (37.5 C)] 99.5 F (37.5 C) (01/12 0602) Pulse Rate:  [63-99] 99 (01/12 0602) Resp:  [14-18] 15 (01/12 0602) BP: (104-156)/(58-78) 108/58 (01/12 0602) SpO2:  [92 %-99 %] 92 % (01/12 0602) Weight:  [54.9 kg] 54.9 kg (01/11 1305)    Intake/Output from previous day: 01/11 0701 - 01/12 0700 In: 60 [P.O.:60] Out: 140 [Drains:140] Intake/Output this shift: No intake/output data recorded.  Ab soft minimally tender, drain in place with no output  Lab Results:  Recent Labs    02/10/23 1341 02/11/23 0527  WBC 11.0* 12.4*  HGB 12.5* 12.3*  HCT 39.0 36.1*  PLT 262 249   BMET Recent Labs    02/10/23 1341 02/11/23 0527  NA 135 134*  K 4.8 4.7  CL 104 101  CO2 27 26  GLUCOSE 82 95  BUN 16 15  CREATININE 0.52* 0.93  CALCIUM 8.9 8.8*   PT/INR No results for input(s): LABPROT, INR in the last 72 hours. ABG No results for input(s): PHART, HCO3 in the last 72 hours.  Invalid input(s): PCO2, PO2  Studies/Results: CT ABDOMEN PELVIS W CONTRAST Result Date: 02/10/2023 CLINICAL DATA:  Worsening right abdominal pain. Acute cholecystitis. Percutaneous cholecystostomy. EXAM: CT ABDOMEN AND PELVIS WITH CONTRAST TECHNIQUE: Multidetector CT imaging of the abdomen and pelvis was performed using the standard protocol following bolus administration of intravenous contrast. RADIATION DOSE REDUCTION: This exam was performed according to the departmental dose-optimization program  which includes automated exposure control, adjustment of the mA and/or kV according to patient size and/or use of iterative reconstruction technique. CONTRAST:  OMNIPAQUE  IOHEXOL  300 MG/ML  SOLN COMPARISON:  01/09/2023 FINDINGS: Lower Chest: No acute findings. Hepatobiliary: Several tiny hepatic cysts remains stable. No evidence of hepatic mass or abscess. Percutaneous cholecystostomy tube is seen in appropriate position. Changes of acute cholecystitis show no significant change mild diffuse biliary ductal dilatation remains stable. Pancreas: No mass or inflammatory changes. No evidence of pancreatic ductal dilatation. Spleen: Within normal limits in size and appearance. Adrenals/Urinary Tract: Horseshoe kidney again noted. No suspicious masses identified. No evidence of ureteral calculi or hydronephrosis. Unremarkable unopacified urinary bladder. Stomach/Bowel: No evidence of obstruction, inflammatory process or abnormal fluid collections. Vascular/Lymphatic: No pathologically enlarged lymph nodes. No acute vascular findings. Reproductive:  Stable mildly enlarged prostate. Other:  None. Musculoskeletal: No suspicious bone lesions identified. 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 IMPRESSION: Percutaneous cholecystostomy tube in appropriate position. Acute cholecystitis, otherwise not significantly changed in appearance. Stable mild diffuse biliary ductal dilatation. Horseshoe kidney. Stable mildly enlarged prostate. Electronically Signed   By: Norleen DELENA Kil M.D.   On: 02/10/2023 15:39    Anti-infectives: Anti-infectives (From admission, onward)    Start     Dose/Rate Route Frequency Ordered Stop   02/10/23 1730  piperacillin -tazobactam (ZOSYN ) IVPB 3.375 g        3.375 g 12.5 mL/hr over 240 Minutes Intravenous  Once 02/10/23 1716 02/10/23 2130       Assessment/Plan: Cholecystitis s/p perc chole -ct shows drain in position but not sure  functional, will ask IR to see and do study -continue abx -needs ampullary mass resolved prior to lap chole- [facility] to see today  I reviewed hospitalist notes, last 24 h vitals and pain scores, last 48 h intake and output, last 24 h labs and trends, and last 24 h imaging results.  CT reviewed showing perc chole and mildly dilated biliary system as well as panc duct    Donnice Bury 02/11/2023

## 2023-02-11 NOTE — Plan of Care (Signed)
  Problem: Education: Goal: Knowledge of General Education information will improve Description: Including pain rating scale, medication(s)/side effects and non-pharmacologic comfort measures Outcome: Progressing   Problem: Health Behavior/Discharge Planning: Goal: Ability to manage health-related needs will improve Outcome: Progressing   Problem: Pain Management: Goal: General experience of comfort will improve Outcome: Progressing

## 2023-02-11 NOTE — Hospital Course (Addendum)
 68 y.o. male with medical history significant of atherosclerosis, emphysema, history of pneumonia, right eye cataract who was admitted last month from 01/09/2023 until 01/14/2023 due to acute cholecystitis with cholelithiasis undergoing a percutaneous drain placement on 01/12/2023 with GI recommending that the patient will go EUS in the future but he returned 02/10/23 with complaints of right upper quadrant pain associated with increased output from his percutaneous cholecystostomy drain and mild erythema at insertion site and his wife unable to flush the tube. In the ED hemodynamically stable afebrile. Labs with a stable CMP, mild leukocytosis 11 normal lactic acid. IR consulted to address drain.  GI and surgery has been consulted as well.  He will need EUS for evaluation of double duct sign at some point before cholecystectomy. 1/13: Cholecystostomy tube exchange by IR 1/14: CT JAI:[id number] in appropriate position with persistent gallbladder wall thickening and pericholecystic fluid. Persistent mild intrahepatic and extrahepatic biliary dilatation 1/15: EUS> no significant abnormality in the ampulla, CBD 8 mm few benign lymph nodes visualized cyst was found in the left lobe of the liver 10 x 10, finding of chronic pancreatitis noticed, and GI advised to proceed with cholecystectomy.

## 2023-02-11 NOTE — Progress Notes (Signed)
 Pharmacy Antibiotic Note  Aaron Stevens is a 68 y.o. male admitted on 02/10/2023 with  IAI .  Pharmacy has been consulted for ZOsyn  dosing.  Plan: Zosyn  3.375g IV q8h (4 hour infusion).  Height: 5' 6 (167.6 cm) Weight: 54.9 kg (121 lb) IBW/kg (Calculated) : 63.8  Temp (24hrs), Avg:98.4 F (36.9 C), Min:97.7 F (36.5 C), Max:99.5 F (37.5 C)  Recent Labs  Lab 02/10/23 1341 02/10/23 1349 02/10/23 1721 02/11/23 0527  WBC 11.0*  --   --  12.4*  CREATININE 0.52*  --   --  0.93  LATICACIDVEN  --  0.5 0.8  --     Estimated Creatinine Clearance: 59 mL/min (by C-G formula based on SCr of 0.93 mg/dL).    Allergies  Allergen Reactions   Sulfa  Antibiotics Anaphylaxis and Other (See Comments)   Bee Venom Other (See Comments)     Dosage will likely remain stable at above dosage and need for further dosage adjustment appears unlikely at present.    Will sign off at this time.  Please reconsult if a change in clinical status warrants re-evaluation of dosage.    Dolphus Roller, PharmD, BCPS 02/11/2023 8:16 AM

## 2023-02-11 NOTE — Consult Note (Signed)
 Chief Complaint: Patient was seen in consultation today for perc chole eval, decreased output and pain when flushed Chief Complaint  Patient presents with   Abdominal Pain    Drain issues   at the request of Ebbie Cough   Referring Physician(s): Ebbie Cough   Supervising Physician: Philip Cornet  Patient Status: [facility] - In-pt  History of Present Illness: Aaron Stevens is a 68 y.o. male with PMHs of PNA, emphysema, and acute cholecystitis s/p perc chole placement by Dr. Johann on 01/12/23 presented to ED with RUQ pain, decreased output and pain with flushing the drain IR was consulted for perc chole eval.   Patient is known to IR service for perc chole placement on 01/12/23 and cholangiogram on 01/25/23, cholangiogram showed persistent cystic duct occlusion. 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 Dianna from GI on 01/10/23, patient is to be scheduled for EUS as outpatient with Dr. Burnette.   Patient presented to ED on 1/11 due to  right upper quadrant pain associated with increased output from his percutaneous cholecystostomy drain and mild erythema at insertion site. Labs  with normal LFT, WBCc is trending up, 12.4 today (11.0 yesterday.) Patient was admitted for further eval and management, general surgery is following the patient and recommended perc chole eval by IR. CT reviewed with Dr. Philip, the drain is in good position.   Patient laying in bed, not in acute distress.  Denise headache, fever, chills, shortness of breath, cough, chest pain, abdominal pain, nausea ,vomiting, and bleeding.  States that the drain started to bother him 2 days ago, and output decreased significantly despite no significant change in his diet. Patient was informed that IR is planing for cholangiogram and possible exchange tomorrow. He asks if he will be put under for the procedure, as  perc chole placement was quite painful.  Informed the patient that exchange typically does not cause significant pain, he may receive local numbing medicine but he will not require moderate sedation. He verbalized understanding.   Past Medical History:  Diagnosis Date   Atherosclerosis of artery    Emphysema of lung (HCC)    Pneumonia     Past Surgical History:  Procedure Laterality Date   APPENDECTOMY     IR PERC CHOLECYSTOSTOMY  01/12/2023   IR SINUS/FIST TUBE CHK-NON GI  01/25/2023   KNEE SURGERY      Allergies: Sulfa  antibiotics and Bee venom  Medications: Prior to Admission medications   Medication Sig Start Date End Date Taking? Authorizing Provider  acetaminophen  (TYLENOL ) 650 MG CR tablet Take 1,300 mg by mouth every 8 (eight) hours as needed for pain.   Yes [provider]  albuterol  (VENTOLIN  HFA) 108 (90 Base) MCG/ACT inhaler Inhale 2 puffs into the lungs every 6 (six) hours as needed for wheezing or shortness of breath. Patient taking differently: Inhale 1 puff into the lungs in the morning and at bedtime. 01/14/23  Yes Tobie Yetta HERO, MD  gabapentin  (NEURONTIN ) 300 MG capsule Take 900 mg by mouth at bedtime.   Yes [provider]  methocarbamol  (ROBAXIN ) 500 MG tablet Take 1 tablet (500 mg total) by mouth 3 (three) times daily. 01/14/23  Yes Tobie Yetta HERO, MD  nicotine  (NICODERM CQ  - DOSED IN MG/24 HOURS) 21 mg/24hr patch Place 1 patch (21 mg total) onto the skin daily. 01/14/23 01/14/24 Yes Tobie Yetta HERO, MD  oxyCODONE -acetaminophen  (PERCOCET)  5-325 MG tablet Take 1 tablet by mouth every 8 (eight) hours as needed for severe pain (pain score 7-10). 01/26/23 01/26/24 Yes Crain, Whitney L, PA  OVER THE COUNTER MEDICATION Take 1 Bar by mouth daily as needed. [facility] Candy Patient not taking: Reported on 02/10/2023    [provider]  Spacer/Aero-Holding Chambers DEVI 1 each by Does not apply route as needed. 06/13/22   [name], NP      History reviewed. No pertinent family history.  Social History   Socioeconomic History   Marital status: Married    Spouse name: Not on file   Number of children: Not on file   Years of education: Not on file   Highest education level: Not on file  Occupational History   Not on file  Tobacco Use   Smoking status: Every Day    Current packs/day: 0.50    Types: Cigarettes   Smokeless tobacco: Never  Vaping Use   Vaping status: Never Used  Substance and Sexual Activity   Alcohol use: No   Drug use: No   Sexual activity: Not Currently    Partners: Female  Other Topics Concern   Not on file  Social History Narrative   ** Merged History Encounter **       Social Drivers of Health   Financial Resource Strain: Not on file  Food Insecurity: No Food Insecurity (02/11/2023)   Hunger Vital Sign    Worried About Running Out of Food in the Last Year: Never true    Ran Out of Food in the Last Year: Never true  Transportation Needs: No Transportation Needs (02/11/2023)   PRAPARE - Administrator, Civil Service (Medical): No    Lack of Transportation (Non-Medical): No  Physical Activity: Not on file  Stress: Not on file  Social Connections: Moderately Integrated (02/11/2023)   Social Connection and Isolation Panel [NHANES]    Frequency of Communication with Friends and Family: More than three times a week    Frequency of Social Gatherings with Friends and Family: Twice a week    Attends Religious Services: 1 to 4 times per year    Active Member of [employer] or Organizations: No    Attends Banker Meetings: Never    Marital Status: Married     Review of Systems: A 12 point ROS discussed and pertinent positives are indicated in the HPI above.  All other systems are negative.  Vital Signs: BP 106/63 (BP Location: Right Arm)   Pulse 87   Temp 98.6 F (37 C) (Oral)   Resp 16   Ht 5' 6 (1.676 m)   Wt 121 lb (54.9 kg)   SpO2 94%   BMI 19.53 kg/m     Physical Exam Vitals reviewed.  Constitutional:      General: He is not in acute distress.    Appearance: He is not ill-appearing.  HENT:     Head: Normocephalic and atraumatic.  Pulmonary:     Effort: Pulmonary effort is normal.  Abdominal:     General: Abdomen is flat.     Palpations: Abdomen is soft.     Comments: Positive RUQ drain to a gravity bag. Site is unremarkable with no erythema, edema, tenderness, bleeding or drainage. Suture and stat lock in place. No dressing.  Trace of clear fluid in the bag, drain flushes easily but does not aspirate much. Cream colored small debris aspirated, no bile.  Skin:    General: Skin  is warm and dry.     Coloration: Skin is not cyanotic or jaundiced.  Neurological:     Mental Status: He is alert and oriented to person, place, and time.  Psychiatric:        Mood and Affect: Mood normal.        Behavior: Behavior normal.        Imaging: CT ABDOMEN PELVIS W CONTRAST Result Date: 02/10/2023 CLINICAL DATA:  Worsening right abdominal pain. Acute cholecystitis. Percutaneous cholecystostomy. EXAM: CT ABDOMEN AND PELVIS WITH CONTRAST TECHNIQUE: Multidetector CT imaging of the abdomen and pelvis was performed using the standard protocol following bolus administration of intravenous contrast. RADIATION DOSE REDUCTION: This exam was performed according to the departmental dose-optimization program which includes automated exposure control, adjustment of the mA and/or kV according to patient size and/or use of iterative reconstruction technique. CONTRAST:  OMNIPAQUE  IOHEXOL  300 MG/ML  SOLN COMPARISON:  01/09/2023 FINDINGS: Lower Chest: No acute findings. Hepatobiliary: Several tiny hepatic cysts remains stable. No evidence of hepatic mass or abscess. Percutaneous cholecystostomy tube is seen in appropriate position. Changes of acute cholecystitis show no significant change mild diffuse biliary ductal dilatation remains stable. Pancreas: No mass or  inflammatory changes. No evidence of pancreatic ductal dilatation. Spleen: Within normal limits in size and appearance. Adrenals/Urinary Tract: Horseshoe kidney again noted. No suspicious masses identified. No evidence of ureteral calculi or hydronephrosis. Unremarkable unopacified urinary bladder. Stomach/Bowel: No evidence of obstruction, inflammatory process or abnormal fluid collections. Vascular/Lymphatic: No pathologically enlarged lymph nodes. No acute vascular findings. Reproductive:  Stable mildly enlarged prostate. Other:  None. Musculoskeletal: No suspicious bone lesions identified. 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 IMPRESSION: Percutaneous cholecystostomy tube in appropriate position. Acute cholecystitis, otherwise not significantly changed in appearance. Stable mild diffuse biliary ductal dilatation. Horseshoe kidney. Stable mildly enlarged prostate. Electronically Signed   By: Norleen DELENA Kil M.D.   On: 02/10/2023 15:39   IR Sinus/Fist Tube Chk-Non GI Result Date: 01/25/2023 INDICATION: History of acute cholecystitis, post recent cholecystostomy tube placement on 01/12/2023. Catheter site discomfort EXAM: CHOLECYSTOSTOMY TUBE INJECTION COMPARISON:  IR fluoroscopy, 01/12/2023. MEDICATIONS: None ANESTHESIA/SEDATION: None CONTRAST:  10mL OMNIPAQUE  IOHEXOL  300 MG/ML SOLN - administered into the gallbladder fossa. FLUOROSCOPY TIME:  Fluoroscopic dose; 1 mGy COMPLICATIONS: None immediate. PROCEDURE: The patient was positioned supine on the fluoroscopy table. A preprocedural spot fluoroscopic image was obtained of the right upper abdominal quadrant existing cholecystostomy tube. Multiple spot fluoroscopic radiographic images were obtained of the right upper abdominal quadrant an existing cholecystostomy tube following injection of a small amount of contrast. Images were reviewed and discussed with the patient. The cholecystostomy tube was flushed with  a small amount of saline and connected to drainage bag. A dressing was placed. The patient tolerated the procedure well without immediate postprocedural complication. FINDINGS: *Preprocedural spot fluoroscopic image of the right upper abdominal quadrant demonstrates grossly unchanged positioning of the cholecystostomy tube with end coiled and locked overlying the expected location of the gallbladder fundus. *Contrast injection demonstrates appropriate functionality of the cholecystostomy tube with brisk opacification of the gallbladder. *There is persistent cystic duct occlusion without the passage of contrast through the cystic duct. IMPRESSION: 1. Appropriately positioned and functioning cholecystostomy tube. No exchange was performed. 2. Non patent cystic duct. PLAN: The patient will return to Vascular Interventional Radiology (VIR) for routine drainage catheter evaluation and exchange in 6 weeks. Thom Hall, MD [facility] Radiology Electronically Signed   By:  Thom Hall M.D.   On: 01/25/2023 16:40   IR Perc Cholecystostomy Result Date: 01/12/2023 CLINICAL DATA:  Acute cholecystitis EXAM: PERCUTANEOUS CHOLECYSTOSTOMY TUBE PLACEMENT WITH ULTRASOUND AND FLUOROSCOPIC GUIDANCE FLUOROSCOPY: Radiation Exposure Index (as provided by the fluoroscopic device): 1 mGy air Kerma TECHNIQUE: The procedure, risks (including but not limited to bleeding, infection, organ damage ), benefits, and alternatives were explained to the patient. Questions regarding the procedure were encouraged and answered. The patient understands and consents to the procedure. Survey ultrasound of the abdomen was performed and an appropriate skin entry site was identified. Skin site was marked, prepped with chlorhexidine, and draped in usual sterile fashion, and infiltrated locally with 1% lidocaine . Intravenous Fentanyl  50mcg and Versed  2mg  were administered by RN during a total moderate  (conscious) sedation time of 10 minutes; the patient's level of consciousness and physiological / cardiorespiratory status were monitored continuously by radiology RN under my direct supervision. As antibiotic prophylaxis, Flagyl  500 mg was ordered pre-procedure and administered intravenously within one hour of incision. Under real-time ultrasound guidance, gallbladder was accessed using a transhepatic approach with a 21-gauge needle. Ultrasound image documentation was saved. Bile returned through the hub. Needle was exchanged over a 018 guidewire for transitional dilator which allowed placement of 035 J wire. Over this, a 10.2 French pigtail catheter was advanced and formed centrally in the gallbladder lumen. Small contrast injection confirmed appropriate position. Catheter secured externally with 0 Prolene suture and placed external drain bag. Patient tolerated the procedure well. COMPLICATIONS: COMPLICATIONS none IMPRESSION: 1. Technically successful percutaneous cholecystostomy tube placement with ultrasound and fluoroscopic guidance. Electronically Signed   By: JONETTA Faes M.D.   On: 01/12/2023 15:49    Labs:  CBC: Recent Labs    01/14/23 0851 01/26/23 1435 02/10/23 1341 02/11/23 0527  WBC 9.8 9.9 11.0* 12.4*  HGB 13.8 13.6 12.5* 12.3*  HCT 40.0 41.3 39.0 36.1*  PLT 311 499* 262 249    COAGS: No results for input(s): INR, APTT in the last 8760 hours.  BMP: Recent Labs    01/12/23 0432 01/13/23 0525 01/26/23 1435 02/10/23 1341 02/11/23 0527  NA 134* 132* 138 135 134*  K 3.8 3.7 5.4* 4.8 4.7  CL 102 101 99 104 101  CO2 25 24 32 27 26  GLUCOSE 109* 99 88 82 95  BUN 10 12 14 16 15   CALCIUM 8.2* 8.1* 9.3 8.9 8.8*  CREATININE 0.72 0.72 0.79 0.52* 0.93  GFRNONAA >60 >60  --  >60 >60    LIVER FUNCTION TESTS: Recent Labs    01/12/23 0432 01/13/23 0525 01/26/23 1435 02/10/23 1341 02/11/23 0527  BILITOT 0.4 0.6 0.4 1.1 0.9  AST 15 22 17  32 28  ALT 12 26 21 30 30    ALKPHOS 86 80  --  74 84  PROT 5.6* 5.6* 6.2 6.7 6.2*  ALBUMIN 2.9* 2.8*  --  3.6 3.4*    TUMOR MARKERS: No results for input(s): AFPTM, CEA, CA199, CHROMGRNA in the last 8760 hours.  Assessment and Plan: 68 y.o. male with acute cholecystitis s/p perc chole placement on 01/12/23 presents with issues with the perc chole drain. IR was requested for eval.   The perc chole drain is in good position.  Was able to flush easily, does not aspirate much, aspirated cream colored small debris.  Output appears clear fluid today, 140 mL documented overnight   Patient is scheduled for cholangiogram and possible drain exchange tomorrow.  No sedation anticipated, patient can have diet from IR stand  point.   Thank you for this interesting consult.  I greatly enjoyed meeting Pheng Prokop and look forward to participating in their care.  A copy of this report was sent to the requesting provider on this date.  Electronically Signed: Toya VEAR Cousin, PA-C 02/11/2023, 10:50 AM   I spent a total of 20 Minutes    in face to face in clinical consultation, greater than 50% of which was counseling/coordinating care for perc chole eval.   This chart was dictated using voice recognition software.  Despite best efforts to proofread,  errors can occur which can change the documentation meaning.

## 2023-02-12 ENCOUNTER — Inpatient Hospital Stay (HOSPITAL_COMMUNITY): Payer: Medicare Other

## 2023-02-12 DIAGNOSIS — K819 Cholecystitis, unspecified: Secondary | ICD-10-CM | POA: Diagnosis not present

## 2023-02-12 HISTORY — PX: IR EXCHANGE BILIARY DRAIN: IMG6046

## 2023-02-12 LAB — CBC
HCT: 35.4 % — ABNORMAL LOW (ref 39.0–52.0)
Hemoglobin: 12.2 g/dL — ABNORMAL LOW (ref 13.0–17.0)
MCH: 31.6 pg (ref 26.0–34.0)
MCHC: 34.5 g/dL (ref 30.0–36.0)
MCV: 91.7 fL (ref 80.0–100.0)
Platelets: 217 10*3/uL (ref 150–400)
RBC: 3.86 MIL/uL — ABNORMAL LOW (ref 4.22–5.81)
RDW: 14.7 % (ref 11.5–15.5)
WBC: 10.9 10*3/uL — ABNORMAL HIGH (ref 4.0–10.5)
nRBC: 0 % (ref 0.0–0.2)

## 2023-02-12 LAB — COMPREHENSIVE METABOLIC PANEL
ALT: 25 U/L (ref 0–44)
AST: 23 U/L (ref 15–41)
Albumin: 3.2 g/dL — ABNORMAL LOW (ref 3.5–5.0)
Alkaline Phosphatase: 78 U/L (ref 38–126)
Anion gap: 8 (ref 5–15)
BUN: 13 mg/dL (ref 8–23)
CO2: 26 mmol/L (ref 22–32)
Calcium: 8.7 mg/dL — ABNORMAL LOW (ref 8.9–10.3)
Chloride: 102 mmol/L (ref 98–111)
Creatinine, Ser: 1 mg/dL (ref 0.61–1.24)
GFR, Estimated: >60 mL/min (ref >60)
Glucose, Bld: 94 mg/dL (ref 70–99)
Potassium: 4.1 mmol/L (ref 3.5–5.1)
Sodium: 136 mmol/L (ref 135–145)
Total Bilirubin: 0.9 mg/dL (ref 0.0–1.2)
Total Protein: 6 g/dL — ABNORMAL LOW (ref 6.5–8.1)

## 2023-02-12 MED ORDER — LIDOCAINE-EPINEPHRINE 1 %-1:100000 IJ SOLN
INTRAMUSCULAR | Status: AC
  Filled 2023-02-12: qty 1

## 2023-02-12 MED ORDER — SODIUM CHLORIDE 0.9% FLUSH
5.0000 mL | Freq: Three times a day (TID) | INTRAVENOUS | Status: DC
  Administered 2023-02-12 – 2023-02-17 (×14): 5 mL

## 2023-02-12 MED ORDER — LIDOCAINE-EPINEPHRINE 1 %-1:100000 IJ SOLN
20.0000 mL | Freq: Once | INTRAMUSCULAR | Status: AC
  Administered 2023-02-12: 2 mL via INTRADERMAL

## 2023-02-12 MED ORDER — IOHEXOL 300 MG/ML  SOLN
50.0000 mL | Freq: Once | INTRAMUSCULAR | Status: AC | PRN
  Administered 2023-02-12: 10 mL

## 2023-02-12 NOTE — Plan of Care (Signed)
 ?  Problem: Clinical Measurements: ?Goal: Will remain free from infection ?Outcome: Progressing ?  ?

## 2023-02-12 NOTE — Progress Notes (Signed)
 Mobility Specialist - Progress Note   02/12/23 0921  Mobility  Activity Ambulated independently in hallway  Level of Assistance Independent  Assistive Device None  Distance Ambulated (ft) 500 ft  Activity Response Tolerated well  Mobility Referral Yes  Mobility visit 1 Mobility  Mobility Specialist Start Time (ACUTE ONLY) 0906  Mobility Specialist Stop Time (ACUTE ONLY) 0917  Mobility Specialist Time Calculation (min) (ACUTE ONLY) 11 min   Pt received in bed and agreeable to mobility. No complaints during session. Pt to bed after session with all needs met.    [facility]

## 2023-02-12 NOTE — Progress Notes (Signed)
   02/12/23 0929  TOC Brief Assessment  Insurance and Status Reviewed  Patient has primary care physician Yes  Home environment has been reviewed home with spouse  Prior level of function: indepedent  Prior/Current Home Services No current home services  Social Drivers of Health Review SDOH reviewed no interventions necessary  Readmission risk has been reviewed Yes  Transition of care needs no transition of care needs at this time

## 2023-02-12 NOTE — Progress Notes (Signed)
 PROGRESS NOTE Aaron Stevens  FMW:[id number] DOB: September 17, 1955 DOA: 02/10/2023 PCP: [name], PA  Brief Narrative/Hospital Course: 68 y.o. male with medical history significant of atherosclerosis, emphysema, history of pneumonia, right eye cataract who was admitted last month from 01/09/2023 until 01/14/2023 due to acute cholecystitis with cholelithiasis undergoing a percutaneous drain placement on 01/12/2023 with GI recommending that the patient will go EUS in the future but he returned 02/10/23 with complaints of right upper quadrant pain associated with increased output from his percutaneous cholecystostomy drain and mild erythema at insertion site and his wife unable to flush the tube. In the ED hemodynamically stable afebrile. Labs with a stable CMP, mild leukocytosis 11 normal lactic acid. IR consulted to address drain.  GI and surgery has been consulted as well.  He will need EUS for evaluation of double duct sign at some point before cholecystectomy.    Subjective: Seen examined  Alert awake resting comfortably no abdominal pain, waiting for tube exchange by IR today Wants to eat   Assessment and Plan: Principal Problem:   Cholecystitis Active Problems:   Emphysema of lung (HCC)   Tobacco use   Normocytic anemia   Cholelithiasis   Cholecystitis status post percutaneous cholecystostomy Ampullary mass: CT showing drain in position but not sure functioning or not. GI and surgery has been consulted. He will need EUS for evaluation of double duct sign at some point before cholecystectomy GI to decide on timing.  IR planning for stent exchange today    Emphysema of lung: Continue albuterol  prn  Normocytic anemia: In the setting of chronic illness infection monitor hemoglobin  DVT prophylaxis: SCDs Start: 02/10/23 1741 Code Status:   Code Status: Full Code Family Communication: plan of care discussed with patient at bedside. Patient status is: Remains hospitalized  because of severity of illness Level of care: Med-Surg   Dispo: The patient is from: HOME W/ wife            Anticipated disposition: Pending GI clearance Objective: Vitals last 24 hrs: Vitals:   02/11/23 1744 02/11/23 1932 02/11/23 2040 02/12/23 0542  BP: 111/60  (!) 111/57 113/62  Pulse: 87  87 79  Resp: 18  16 16   Temp: 98.7 F (37.1 C)  98.6 F (37 C) 98.6 F (37 C)  TempSrc: Oral  Oral Oral  SpO2: 95% 95% 93% 93%  Weight:      Height:       Weight change:   Physical Examination: General exam: alert awake, thin frail HEENT:Oral mucosa moist, Ear/Nose WNL grossly Respiratory system: Bilaterally clear BS,no use of accessory muscle Cardiovascular system: S1 & S2 +, No JVD. Gastrointestinal system: Abdomen soft,NT,ND, BS+ per chol RUQ tube+ Nervous System: Alert, awake, moving all extremities,and following commands. Extremities: LE edema neg,distal peripheral pulses palpable and warm.  Skin: No rashes,no icterus. MSK: Normal muscle bulk,tone, power   Medications reviewed:  Scheduled Meds:  gabapentin   300 mg Oral QHS   methocarbamol   500 mg Oral TID   mometasone -formoterol   2 puff Inhalation BID  Continuous Infusions:  piperacillin -tazobactam (ZOSYN )  IV 3.375 g (02/12/23 0520)    Diet Order             Diet NPO time specified  Diet effective midnight           DIET SOFT Fluid consistency: Thin  Diet effective now                  Intake/Output Summary (Last 24 hours)  at 02/12/2023 1028 Last data filed at 02/12/2023 1000 Gross per 24 hour  Intake 1092.78 ml  Output 300 ml  Net 792.78 ml   Net IO Since Admission: 838.19 mL [02/12/23 1028]  Wt Readings from Last 3 Encounters:  02/10/23 54.9 kg  01/26/23 54.9 kg  01/09/23 55.7 kg     Unresulted Labs (From admission, onward)    None     Data Reviewed: I have personally reviewed following labs and imaging studies CBC: Recent Labs  Lab 02/10/23 1341 02/11/23 0527 02/12/23 0439  WBC 11.0* 12.4*  10.9*  NEUTROABS 6.6  --   --   HGB 12.5* 12.3* 12.2*  HCT 39.0 36.1* 35.4*  MCV 95.6 91.6 91.7  PLT 262 249 217   Basic Metabolic Panel:  Recent Labs  Lab 02/10/23 1341 02/11/23 0527 02/12/23 0439  NA 135 134* 136  K 4.8 4.7 4.1  CL 104 101 102  CO2 27 26 26   GLUCOSE 82 95 94  BUN 16 15 13   CREATININE 0.52* 0.93 1.00  CALCIUM 8.9 8.8* 8.7*   GFR: Estimated Creatinine Clearance: 54.9 mL/min (by C-G formula based on SCr of 1 mg/dL). Liver Function Tests:  Recent Labs  Lab 02/10/23 1341 02/11/23 0527 02/12/23 0439  AST 32 28 23  ALT 30 30 25   ALKPHOS 74 84 78  BILITOT 1.1 0.9 0.9  PROT 6.7 6.2* 6.0*  ALBUMIN 3.6 3.4* 3.2*   Recent Labs  Lab 02/10/23 1349 02/10/23 1721  LATICACIDVEN 0.5 0.8   No results found for this or any previous visit (from the past 240 hours).  Antimicrobials/Microbiology: Anti-infectives (From admission, onward)    Start     Dose/Rate Route Frequency Ordered Stop   02/11/23 0900  piperacillin -tazobactam (ZOSYN ) IVPB 3.375 g        3.375 g 12.5 mL/hr over 240 Minutes Intravenous Every 8 hours 02/11/23 0815     02/10/23 1730  piperacillin -tazobactam (ZOSYN ) IVPB 3.375 g        3.375 g 12.5 mL/hr over 240 Minutes Intravenous  Once 02/10/23 1716 02/10/23 2130         Component Value Date/Time   SDES  01/12/2023 1359    BILE DRAIN Performed at [facility], [address] [place], [place] [address]    SPECREQUEST  01/12/2023 1359    NONE Performed at [facility], [address] [place], [place] [address]    CULT  01/12/2023 1359    No growth aerobically or anaerobically. Performed at [facility], [address] [place], [place] [address]    REPTSTATUS 01/17/2023 FINAL 01/12/2023 1359     Radiology Studies: CT ABDOMEN PELVIS W CONTRAST Result Date: 02/10/2023 CLINICAL DATA:  Worsening right abdominal pain. Acute cholecystitis. Percutaneous cholecystostomy. EXAM: CT ABDOMEN AND  PELVIS WITH CONTRAST TECHNIQUE: Multidetector CT imaging of the abdomen and pelvis was performed using the standard protocol following bolus administration of intravenous contrast. RADIATION DOSE REDUCTION: This exam was performed according to the departmental dose-optimization program which includes automated exposure control, adjustment of the mA and/or kV according to patient size and/or use of iterative reconstruction technique. CONTRAST:  OMNIPAQUE  IOHEXOL  300 MG/ML  SOLN COMPARISON:  01/09/2023 FINDINGS: Lower Chest: No acute findings. Hepatobiliary: Several tiny hepatic cysts remains stable. No evidence of hepatic mass or abscess. Percutaneous cholecystostomy tube is seen in appropriate position. Changes of acute cholecystitis show no significant change mild diffuse biliary ductal dilatation remains stable. Pancreas: No mass or inflammatory  changes. No evidence of pancreatic ductal dilatation. Spleen: Within normal limits in size and appearance. Adrenals/Urinary Tract: Horseshoe kidney again noted. No suspicious masses identified. No evidence of ureteral calculi or hydronephrosis. Unremarkable unopacified urinary bladder. Stomach/Bowel: No evidence of obstruction, inflammatory process or abnormal fluid collections. Vascular/Lymphatic: No pathologically enlarged lymph nodes. No acute vascular findings. Reproductive:  Stable mildly enlarged prostate. Other:  None. Musculoskeletal: No suspicious bone lesions identified. 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 IMPRESSION: Percutaneous cholecystostomy tube in appropriate position. Acute cholecystitis, otherwise not significantly changed in appearance. Stable mild diffuse biliary ductal dilatation. Horseshoe kidney. Stable mildly enlarged prostate. Electronically Signed   By: Norleen DELENA Kil M.D.   On: 02/10/2023 15:39   LOS: 2 days   Total time spent in review of labs and imaging, patient evaluation,  formulation of plan, documentation and communication with family: 35 minutes  Mennie LAMY, MD [facility]  02/12/2023, 10:28 AM

## 2023-02-12 NOTE — Procedures (Signed)
 Interventional Radiology Procedure Note  Procedure: cholecystostomy exchg    Complications: None  Estimated Blood Loss:  0  Findings: 48fr tube Cystic duct obstructed Gallstones noted    M. Ruel Favors, MD

## 2023-02-12 NOTE — Progress Notes (Signed)
 Subjective: Less abdominal pain.  Objective: Vital signs in last 24 hours: Temp:  [98.2 F (36.8 C)-98.7 F (37.1 C)] 98.6 F (37 C) (01/13 0542) Pulse Rate:  [79-96] 79 (01/13 0542) Resp:  [16-20] 16 (01/13 0542) BP: (111-115)/(56-62) 113/62 (01/13 0542) SpO2:  [92 %-95 %] 93 % (01/13 0542) Weight change:  Last BM Date : 02/11/23  PE: GEN:  NAD, thin ABD:  Mild abdominal tenderness without peritonitis NEURO:  No encephalopathy  Lab Results: CBC    Component Value Date/Time   WBC 10.9 (H) 02/12/2023 0439   RBC 3.86 (L) 02/12/2023 0439   HGB 12.2 (L) 02/12/2023 0439   HCT 35.4 (L) 02/12/2023 0439   PLT 217 02/12/2023 0439   MCV 91.7 02/12/2023 0439   MCH 31.6 02/12/2023 0439   MCHC 34.5 02/12/2023 0439   RDW 14.7 02/12/2023 0439   LYMPHSABS 2.8 02/10/2023 1341   MONOABS 0.9 02/10/2023 1341   EOSABS 0.5 02/10/2023 1341   BASOSABS 0.1 02/10/2023 1341  CMP     Component Value Date/Time   NA 136 02/12/2023 0439   K 4.1 02/12/2023 0439   CL 102 02/12/2023 0439   CO2 26 02/12/2023 0439   GLUCOSE 94 02/12/2023 0439   BUN 13 02/12/2023 0439   CREATININE 1.00 02/12/2023 0439   CREATININE 0.79 01/26/2023 1435   CALCIUM 8.7 (L) 02/12/2023 0439   PROT 6.0 (L) 02/12/2023 0439   ALBUMIN 3.2 (L) 02/12/2023 0439   AST 23 02/12/2023 0439   ALT 25 02/12/2023 0439   ALKPHOS 78 02/12/2023 0439   BILITOT 0.9 02/12/2023 0439   GFRNONAA >60 02/12/2023 0439   Assessment:   Cholecystitis with percutaneous cholecystostomy drain in place. Mildly prominent pancreatic and bile ducts.  Normal LFTs.   Plan:   Antiobiotics per primary team. Diet as tolerated. EUS planned Wednesday late morning. Risks (bleeding, infection, bowel perforation that could require surgery, sedation-related changes in cardiopulmonary systems), benefits (identification and possible treatment of source of symptoms, exclusion of certain causes of symptoms), and alternatives (watchful waiting, radiographic  imaging studies, empiric medical treatment) of upper endoscopy with ultrasound and possible fine needle aspiration (EUS +/- FNA) were explained to patient/family in detail and patient wishes to proceed.  [facility] will follow.   BURNETTE ELSIE HERO 02/12/2023, 11:06 AM   Cell [phone] If no answer or after 5 PM call [phone]

## 2023-02-12 NOTE — Progress Notes (Signed)
 Subjective/Chief Complaint: unchanged   Objective: Vital signs in last 24 hours: Temp:  [98.2 F (36.8 C)-98.7 F (37.1 C)] 98.6 F (37 C) (01/13 0542) Pulse Rate:  [79-96] 79 (01/13 0542) Resp:  [16-20] 16 (01/13 0542) BP: (106-115)/(56-63) 113/62 (01/13 0542) SpO2:  [92 %-95 %] 93 % (01/13 0542) Last BM Date : 02/11/23  Intake/Output from previous day: 01/12 0701 - 01/13 0700 In: 1218.2 [P.O.:1060; IV Piggyback:158.2] Out: 300 [Urine:200; Drains:100] Intake/Output this shift: No intake/output data recorded.  Ab soft mild tender ruq drain without any output  Lab Results:  Recent Labs    02/11/23 0527 02/12/23 0439  WBC 12.4* 10.9*  HGB 12.3* 12.2*  HCT 36.1* 35.4*  PLT 249 217   BMET Recent Labs    02/11/23 0527 02/12/23 0439  NA 134* 136  K 4.7 4.1  CL 101 102  CO2 26 26  GLUCOSE 95 94  BUN 15 13  CREATININE 0.93 1.00  CALCIUM 8.8* 8.7*   PT/INR No results for input(s): LABPROT, INR in the last 72 hours. ABG No results for input(s): PHART, HCO3 in the last 72 hours.  Invalid input(s): PCO2, PO2  Studies/Results: CT ABDOMEN PELVIS W CONTRAST Result Date: 02/10/2023 CLINICAL DATA:  Worsening right abdominal pain. Acute cholecystitis. Percutaneous cholecystostomy. EXAM: CT ABDOMEN AND PELVIS WITH CONTRAST TECHNIQUE: Multidetector CT imaging of the abdomen and pelvis was performed using the standard protocol following bolus administration of intravenous contrast. RADIATION DOSE REDUCTION: This exam was performed according to the departmental dose-optimization program which includes automated exposure control, adjustment of the mA and/or kV according to patient size and/or use of iterative reconstruction technique. CONTRAST:  OMNIPAQUE  IOHEXOL  300 MG/ML  SOLN COMPARISON:  01/09/2023 FINDINGS: Lower Chest: No acute findings. Hepatobiliary: Several tiny hepatic cysts remains stable. No evidence of hepatic mass or abscess. Percutaneous  cholecystostomy tube is seen in appropriate position. Changes of acute cholecystitis show no significant change mild diffuse biliary ductal dilatation remains stable. Pancreas: No mass or inflammatory changes. No evidence of pancreatic ductal dilatation. Spleen: Within normal limits in size and appearance. Adrenals/Urinary Tract: Horseshoe kidney again noted. No suspicious masses identified. No evidence of ureteral calculi or hydronephrosis. Unremarkable unopacified urinary bladder. Stomach/Bowel: No evidence of obstruction, inflammatory process or abnormal fluid collections. Vascular/Lymphatic: No pathologically enlarged lymph nodes. No acute vascular findings. Reproductive:  Stable mildly enlarged prostate. Other:  None. Musculoskeletal: No suspicious bone lesions identified. 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 IMPRESSION: Percutaneous cholecystostomy tube in appropriate position. Acute cholecystitis, otherwise not significantly changed in appearance. Stable mild diffuse biliary ductal dilatation. Horseshoe kidney. Stable mildly enlarged prostate. Electronically Signed   By: Norleen DELENA Kil M.D.   On: 02/10/2023 15:39    Anti-infectives: Anti-infectives (From admission, onward)    Start     Dose/Rate Route Frequency Ordered Stop   02/11/23 0900  piperacillin -tazobactam (ZOSYN ) IVPB 3.375 g        3.375 g 12.5 mL/hr over 240 Minutes Intravenous Every 8 hours 02/11/23 0815     02/10/23 1730  piperacillin -tazobactam (ZOSYN ) IVPB 3.375 g        3.375 g 12.5 mL/hr over 240 Minutes Intravenous  Once 02/10/23 1716 02/10/23 2130       Assessment/Plan: Cholecystitis s/p perc chole -ct shows drain in position but not sure functional, planned for tube change today -continue abx -needs ampullary mass resolved prior to lap chole- this was scheduled in February but this needs to be  done sooner as if negative we can take to OR to do lap chole or if positive  needs therapy. Appreciate GI seeing him yesterday and await final plan  I reviewed [facility] notes, hospitalist notes, last 24 h vitals and pain scores, last 48 h intake and output, last 24 h labs and trends, and last 24 h imaging results.       Donnice Bury 02/12/2023

## 2023-02-13 ENCOUNTER — Inpatient Hospital Stay (HOSPITAL_COMMUNITY): Payer: Medicare Other

## 2023-02-13 DIAGNOSIS — K819 Cholecystitis, unspecified: Secondary | ICD-10-CM | POA: Diagnosis not present

## 2023-02-13 LAB — COMPREHENSIVE METABOLIC PANEL
ALT: 25 U/L (ref 0–44)
AST: 23 U/L (ref 15–41)
Albumin: 3.2 g/dL — ABNORMAL LOW (ref 3.5–5.0)
Alkaline Phosphatase: 76 U/L (ref 38–126)
Anion gap: 6 (ref 5–15)
BUN: 15 mg/dL (ref 8–23)
CO2: 27 mmol/L (ref 22–32)
Calcium: 8.9 mg/dL (ref 8.9–10.3)
Chloride: 101 mmol/L (ref 98–111)
Creatinine, Ser: 1.01 mg/dL (ref 0.61–1.24)
GFR, Estimated: >60 mL/min (ref >60)
Glucose, Bld: 97 mg/dL (ref 70–99)
Potassium: 4.4 mmol/L (ref 3.5–5.1)
Sodium: 134 mmol/L — ABNORMAL LOW (ref 135–145)
Total Bilirubin: 0.7 mg/dL (ref 0.0–1.2)
Total Protein: 6.5 g/dL (ref 6.5–8.1)

## 2023-02-13 LAB — CBC
HCT: 38.9 % — ABNORMAL LOW (ref 39.0–52.0)
Hemoglobin: 12.4 g/dL — ABNORMAL LOW (ref 13.0–17.0)
MCH: 30.2 pg (ref 26.0–34.0)
MCHC: 31.9 g/dL (ref 30.0–36.0)
MCV: 94.9 fL (ref 80.0–100.0)
Platelets: 241 10*3/uL (ref 150–400)
RBC: 4.1 MIL/uL — ABNORMAL LOW (ref 4.22–5.81)
RDW: 14.6 % (ref 11.5–15.5)
WBC: 11.5 10*3/uL — ABNORMAL HIGH (ref 4.0–10.5)
nRBC: 0 % (ref 0.0–0.2)

## 2023-02-13 MED ORDER — SODIUM CHLORIDE 0.45 % IV SOLN: INTRAVENOUS | Status: AC

## 2023-02-13 MED ORDER — METHOCARBAMOL 1000 MG/10ML IJ SOLN
500.0000 mg | Freq: Four times a day (QID) | INTRAMUSCULAR | Status: DC | PRN
  Administered 2023-02-13 (×2): 500 mg via INTRAVENOUS
  Filled 2023-02-13 (×2): qty 10

## 2023-02-13 MED ORDER — IOHEXOL 300 MG/ML  SOLN
100.0000 mL | Freq: Once | INTRAMUSCULAR | Status: AC | PRN
  Administered 2023-02-13: 100 mL via INTRAVENOUS

## 2023-02-13 MED ORDER — HYDROMORPHONE HCL 1 MG/ML IJ SOLN
0.5000 mg | INTRAMUSCULAR | Status: DC | PRN
  Administered 2023-02-13: 1 mg via INTRAVENOUS
  Administered 2023-02-14: 0.5 mg via INTRAVENOUS
  Administered 2023-02-14 – 2023-02-17 (×18): 1 mg via INTRAVENOUS
  Filled 2023-02-13 (×19): qty 1

## 2023-02-13 NOTE — Progress Notes (Signed)
 EUS planned tomorrow.  From GI perspective ok soft low fat diet and NPO after midnight tonight.

## 2023-02-13 NOTE — Plan of Care (Signed)
 ?  Problem: Clinical Measurements: ?Goal: Will remain free from infection ?Outcome: Progressing ?  ?

## 2023-02-13 NOTE — Progress Notes (Signed)
 Chief Complaint: Patient was seen today for acute cholecystitis with percutaneous cholecystostomy drain.   Referring Physician(s): Ebbie Cough MD  Supervising Physician: Jennefer Rover  Patient Status: [facility] - In-pt  Subjective: 1/14: Patient is laying down in bed. Family is present. Patient reports continued pain from the drain. The drain is aspirating and flushing well, per patient. The dressing was changed today. Patient denies: nausea, vomiting.   Objective: Physical Exam: BP (!) 111/54   Pulse 78   Temp 97.9 F (36.6 C) (Oral)   Resp 18   Ht 5' 6 (1.676 m)   Wt 121 lb (54.9 kg)   SpO2 93%   BMI 19.53 kg/m  Constitutional: Conversant adult male. Patient laying down in bed. INAD. Skin: Drain present in RUQ. Stat lock in place. There is a small amount of Serosanguinous fluid present in the bulb. No surrounding erythema or edema. Clean/dry bandage overlies insertion site.  Abdomen: Soft, mild tenderness RUQ at drain site.  Chest: Symmetric rise and fall of chest wall. Psych: Non pressured speech, logical though process. Neuro: alert and oriented.    Current Facility-Administered Medications:    acetaminophen  (TYLENOL ) tablet 650 mg, 650 mg, Oral, Q6H PRN, 650 mg at 02/12/23 1950 **OR** acetaminophen  (TYLENOL ) suppository 650 mg, 650 mg, Rectal, Q6H PRN, Celinda Alm Lot, MD   albuterol  (PROVENTIL ) (2.5 MG/3ML) 0.083% nebulizer solution 2.5 mg, 2.5 mg, Nebulization, Q6H PRN, Lynwood Setter, RPH, 2.5 mg at 02/10/23 2146   gabapentin  (NEURONTIN ) capsule 300 mg, 300 mg, Oral, QHS, Celinda Alm Lot, MD, 300 mg at 02/12/23 2131   HYDROmorphone  (DILAUDID ) injection 0.5 mg, 0.5 mg, Intravenous, Q2H PRN, Celinda Alm Lot, MD, 0.5 mg at 02/13/23 9141   methocarbamol  (ROBAXIN ) tablet 500 mg, 500 mg, Oral, TID, Celinda Alm Lot, MD, 500 mg at 02/13/23 9141   mometasone -formoterol  (DULERA ) 100-5 MCG/ACT inhaler 2 puff, 2 puff, Inhalation, BID, Lynwood Setter, RPH, 2  puff at 02/13/23 9141   ondansetron  (ZOFRAN ) tablet 4 mg, 4 mg, Oral, Q6H PRN **OR** ondansetron  (ZOFRAN ) injection 4 mg, 4 mg, Intravenous, Q6H PRN, Celinda Alm Lot, MD, 4 mg at 02/11/23 1249   oxyCODONE  (Oxy IR/ROXICODONE ) immediate release tablet 5 mg, 5 mg, Oral, Q6H PRN, Celinda Alm Lot, MD, 5 mg at 02/13/23 0042   piperacillin -tazobactam (ZOSYN ) IVPB 3.375 g, 3.375 g, Intravenous, Q8H, Kc, Ramesh, MD, Last Rate: 12.5 mL/hr at 02/13/23 0523, 3.375 g at 02/13/23 0523   sodium chloride  flush (NS) 0.9 % injection 5 mL, 5 mL, Intracatheter, Q8H, Vanice Sharper, MD, 5 mL at 02/13/23 0523  Labs: CBC Recent Labs    02/12/23 0439 02/13/23 0446  WBC 10.9* 11.5*  HGB 12.2* 12.4*  HCT 35.4* 38.9*  PLT 217 241   BMET Recent Labs    02/12/23 0439 02/13/23 0446  NA 136 134*  K 4.1 4.4  CL 102 101  CO2 26 27  GLUCOSE 94 97  BUN 13 15  CREATININE 1.00 1.01  CALCIUM 8.7* 8.9   LFT Recent Labs    02/13/23 0446  PROT 6.5  ALBUMIN 3.2*  AST 23  ALT 25  ALKPHOS 76  BILITOT 0.7   PT/INR No results for input(s): LABPROT, INR in the last 72 hours.   Studies/Results: IR EXCHANGE BILIARY DRAIN Result Date: 02/12/2023 INDICATION: Calculus cholecystitis, painful cholecystostomy tube with daily flushing EXAM: FLUOROSCOPIC EXCHANGE OF THE 10 FRENCH CHOLECYSTOSTOMY MEDICATIONS: 1% lidocaine  local ANESTHESIA/SEDATION: None. FLUOROSCOPY: Radiation Exposure Index (as provided by the fluoroscopic device): 4.0 mGy Kerma COMPLICATIONS:  None immediate. PROCEDURE: Informed written consent was obtained from the patient after a thorough discussion of the procedural risks, benefits and alternatives. All questions were addressed. Maximal Sterile Barrier Technique was utilized including caps, mask, sterile gowns, sterile gloves, sterile drape, hand hygiene and skin antiseptic. A timeout was performed prior to the initiation of the procedure. Under sterile conditions and local anesthesia, the  existing cholecystostomy was injected with contrast confirming position in the gallbladder fundus. Sludge and stones noted within the gallbladder. Cystic duct is obstructed. Catheter successfully exchanged over a Amplatz guidewire. Contrast injection reconfirms position. Catheter secured with a silk suture and connected to external gravity drainage bag. Sterile dressing applied. No immediate complication. Patient tolerated the procedure well. IMPRESSION: Successful fluoroscopic exchange of the 10 French cholecystostomy Electronically Signed   By: CHRISTELLA.  Shick M.D.   On: 02/12/2023 16:49    Assessment/Plan: Drain Location: RUQ Size: Fr size: 10 Fr Date of placement: 01/12/23, exchange 02/12/23 Currently to: Drain collection device: gravity 24 hour output:  Output by Drain (mL) 02/11/23 0701 - 02/11/23 1900 02/11/23 1901 - 02/12/23 0700 02/12/23 0701 - 02/12/23 1900 02/12/23 1901 - 02/13/23 0700 02/13/23 0701 - 02/13/23 1148  Closed System Drain 1 Lateral;Right RUQ Other (Comment) 10.2 Fr. 40 60 0 40 10    Interval imaging/drain manipulation:  TBD  Current examination: 02/13/23 Patient afebrile. WBC 11.5, HGB 12.4. Output 40mL yesterday and far today.  Flushes/aspirates easily. Serosanguinous fluid present.  Insertion site unremarkable. Suture and stat lock in place. Dressed appropriately. Clean/dry bandage in place. Appreciate surgical teams recommendations.   Plan: Continue TID flushes with 5 cc NS. Record output Q shift. Dressing changes QD or PRN if soiled.  Call IR APP or on call IR MD if difficulty flushing or sudden change in drain output.  Repeat imaging/possible drain injection once output < 10 mL/QD (excluding flush material). Consideration for drain removal if output is < 10 mL/QD (excluding flush material), pending discussion with the providing surgical service.  Discharge planning: Please contact IR APP or on call IR MD prior to patient d/c to ensure appropriate follow  up plans are in place. Typically patient will follow up with IR clinic 10-14 days post d/c for repeat imaging/possible drain injection. IR scheduler will contact patient with date/time of appointment. Patient will need to flush drain QD with 5 cc NS, record output QD, dressing changes every 2-3 days or earlier if soiled.   IR will continue to follow - please call with questions or concerns.      LOS: 3 days   I spent a total of 15 minutes in face to face in clinical consultation, greater than 50% of which was counseling/coordinating care for acute cholecystitis with percutaneous cholecystostomy placement and exchange.   Daved SAILOR Soham Hollett PA-C 02/13/2023 11:45 AM

## 2023-02-13 NOTE — Progress Notes (Signed)
   Subjective/Chief Complaint: Sore ruq, drain exchanged   Objective: Vital signs in last 24 hours: Temp:  [97.9 F (36.6 C)-98.5 F (36.9 C)] 97.9 F (36.6 C) (01/14 9386) Pulse Rate:  [66-85] 78 (01/14 0613) Resp:  [18] 18 (01/14 9386) BP: (100-111)/(54-69) 111/54 (01/14 0613) SpO2:  [92 %-97 %] 93 % (01/14 0613) Last BM Date : 02/11/23  Intake/Output from previous day: 01/13 0701 - 01/14 0700 In: 1439.4 [P.O.:1280; I.V.:10; IV Piggyback:149.4] Out: 1240 [Urine:1200; Drains:40] Intake/Output this shift: No intake/output data recorded.  Ab drain in place with some output, soft tender around drain  Lab Results:  Recent Labs    02/12/23 0439 02/13/23 0446  WBC 10.9* 11.5*  HGB 12.2* 12.4*  HCT 35.4* 38.9*  PLT 217 241   BMET Recent Labs    02/12/23 0439 02/13/23 0446  NA 136 134*  K 4.1 4.4  CL 102 101  CO2 26 27  GLUCOSE 94 97  BUN 13 15  CREATININE 1.00 1.01  CALCIUM 8.7* 8.9   PT/INR No results for input(s): LABPROT, INR in the last 72 hours. ABG No results for input(s): PHART, HCO3 in the last 72 hours.  Invalid input(s): PCO2, PO2  Studies/Results: IR EXCHANGE BILIARY DRAIN Result Date: 02/12/2023 INDICATION: Calculus cholecystitis, painful cholecystostomy tube with daily flushing EXAM: FLUOROSCOPIC EXCHANGE OF THE 10 FRENCH CHOLECYSTOSTOMY MEDICATIONS: 1% lidocaine  local ANESTHESIA/SEDATION: None. FLUOROSCOPY: Radiation Exposure Index (as provided by the fluoroscopic device): 4.0 mGy Kerma COMPLICATIONS: None immediate. PROCEDURE: Informed written consent was obtained from the patient after a thorough discussion of the procedural risks, benefits and alternatives. All questions were addressed. Maximal Sterile Barrier Technique was utilized including caps, mask, sterile gowns, sterile gloves, sterile drape, hand hygiene and skin antiseptic. A timeout was performed prior to the initiation of the procedure. Under sterile conditions and local  anesthesia, the existing cholecystostomy was injected with contrast confirming position in the gallbladder fundus. Sludge and stones noted within the gallbladder. Cystic duct is obstructed. Catheter successfully exchanged over a Amplatz guidewire. Contrast injection reconfirms position. Catheter secured with a silk suture and connected to external gravity drainage bag. Sterile dressing applied. No immediate complication. Patient tolerated the procedure well. IMPRESSION: Successful fluoroscopic exchange of the 10 French cholecystostomy Electronically Signed   By: CHRISTELLA.  Shick M.D.   On: 02/12/2023 16:49    Anti-infectives: Anti-infectives (From admission, onward)    Start     Dose/Rate Route Frequency Ordered Stop   02/11/23 0900  piperacillin -tazobactam (ZOSYN ) IVPB 3.375 g        3.375 g 12.5 mL/hr over 240 Minutes Intravenous Every 8 hours 02/11/23 0815     02/10/23 1730  piperacillin -tazobactam (ZOSYN ) IVPB 3.375 g        3.375 g 12.5 mL/hr over 240 Minutes Intravenous  Once 02/10/23 1716 02/10/23 2130       Assessment/Plan: Cholecystitis s/p perc chole -tube exchange yesterday,still has cholecystitis -will continue abx -await eus, appreciate moving this up, so we can decide next steps -I discussed this with he and his wife this am  I reviewed hospitalist notes, last 24 h vitals and pain scores, last 48 h intake and output, last 24 h labs and trends, and last 24 h imaging results.     Donnice Bury 02/13/2023

## 2023-02-13 NOTE — Progress Notes (Signed)
 PROGRESS NOTE Aaron Stevens  FMW:[id number] DOB: 03/13/1955 DOA: 02/10/2023 PCP: Aaron Benton CROME, PA  Brief Narrative/Hospital Course: 68 y.o. male with medical history significant of atherosclerosis, emphysema, history of pneumonia, right eye cataract who was admitted last month from 01/09/2023 until 01/14/2023 due to acute cholecystitis with cholelithiasis undergoing a percutaneous drain placement on 01/12/2023 with GI recommending that the patient will go EUS in the future but he returned 02/10/23 with complaints of right upper quadrant pain associated with increased output from his percutaneous cholecystostomy drain and mild erythema at insertion site and his wife unable to flush the tube. In the ED hemodynamically stable afebrile. Labs with a stable CMP, mild leukocytosis 11 normal lactic acid. IR consulted to address drain.  GI and surgery has been consulted as well.  He will need EUS for evaluation of double duct sign at some point before cholecystectomy. 1/13: Cholecystostomy tube exchange by IR    Subjective: Seen and examined Doing well pinkish drainage on the per Chole tube Overnight afebrile BP stable no new complaints  Labs stable Wife at the bedside-is hungry.  Assessment and Plan: Principal Problem:   Cholecystitis Active Problems:   Emphysema of lung (HCC)   Tobacco use   Normocytic anemia   Cholelithiasis   Cholecystitis S/Pperc cholecystostomy recently 12/13 Still with cholecystitis Ampullary mass: CT showing drain in position but not sure functioning or not. GI and surgery following, 1/13 cholecystomy tube exchanged by IR and plan for EUS 1/15 due to double duct sign at some point before cholecystectomy Continue empiric antibiotics with Zosyn .  Per GI okay for soft diet today, keep n.p.o. past midnight    Emphysema of lung: Stable, continue albuterol  prn  Normocytic anemia: In the setting of chronic illness infection monitor hemoglobin intermittently  DVT  prophylaxis: SCDs Start: 02/10/23 1741 Code Status:   Code Status: Full Code Family Communication: plan of care discussed with patient at bedside. Patient status is: Remains hospitalized because of severity of illness Level of care: Med-Surg   Dispo: The patient is from: HOME W/ wife            Anticipated disposition: TBD 2 DAYS Objective: Vitals last 24 hrs: Vitals:   02/12/23 1409 02/12/23 2112 02/12/23 2122 02/13/23 0613  BP: 100/69  109/62 (!) 111/54  Pulse: 66  85 78  Resp: 18  18 18   Temp: 98.5 F (36.9 C)  98.5 F (36.9 C) 97.9 F (36.6 C)  TempSrc: Oral  Oral Oral  SpO2: 97% 92% 94% 93%  Weight:      Height:       Weight change:   Physical Examination: General exam: alert awake, thin frail.  Pleasant  HEENT:Oral mucosa moist, Ear/Nose WNL grossly Respiratory system: Bilaterally clear BS,no use of accessory muscle Cardiovascular system: S1 & S2 +, No JVD. Gastrointestinal system: Abdomen soft,NT,ND, B RUQ tender w/ GB DRAIN w/ pinkish output Nervous System: Alert, awake, moving all extremities,and following commands. Extremities: LE edema neg,distal peripheral pulses palpable and warm.  Skin: No rashes,no icterus. MSK: Normal muscle bulk,tone, power   Medications reviewed:  Scheduled Meds:  gabapentin   300 mg Oral QHS   methocarbamol   500 mg Oral TID   mometasone -formoterol   2 puff Inhalation BID   sodium chloride  flush  5 mL Intracatheter Q8H  Continuous Infusions:  piperacillin -tazobactam (ZOSYN )  IV 3.375 g (02/13/23 0523)    Diet Order             Diet NPO time specified Except for:  Sips with Meds  Diet effective midnight                  Intake/Output Summary (Last 24 hours) at 02/13/2023 0910 Last data filed at 02/13/2023 0600 Gross per 24 hour  Intake 1439.36 ml  Output 1240 ml  Net 199.36 ml   Net IO Since Admission: 1,037.55 mL [02/13/23 0910]  Wt Readings from Last 3 Encounters:  02/10/23 54.9 kg  01/26/23 54.9 kg  01/09/23 55.7 kg      Unresulted Labs (From admission, onward)    None     Data Reviewed: I have personally reviewed following labs and imaging studies CBC: Recent Labs  Lab 02/10/23 1341 02/11/23 0527 02/12/23 0439 02/13/23 0446  WBC 11.0* 12.4* 10.9* 11.5*  NEUTROABS 6.6  --   --   --   HGB 12.5* 12.3* 12.2* 12.4*  HCT 39.0 36.1* 35.4* 38.9*  MCV 95.6 91.6 91.7 94.9  PLT 262 249 217 241   Basic Metabolic Panel:  Recent Labs  Lab 02/10/23 1341 02/11/23 0527 02/12/23 0439 02/13/23 0446  NA 135 134* 136 134*  K 4.8 4.7 4.1 4.4  CL 104 101 102 101  CO2 27 26 26 27   GLUCOSE 82 95 94 97  BUN 16 15 13 15   CREATININE 0.52* 0.93 1.00 1.01  CALCIUM 8.9 8.8* 8.7* 8.9   GFR: Estimated Creatinine Clearance: 54.4 mL/min (by C-G formula based on SCr of 1.01 mg/dL). Liver Function Tests:  Recent Labs  Lab 02/10/23 1341 02/11/23 0527 02/12/23 0439 02/13/23 0446  AST 32 28 23 23   ALT 30 30 25 25   ALKPHOS 74 84 78 76  BILITOT 1.1 0.9 0.9 0.7  PROT 6.7 6.2* 6.0* 6.5  ALBUMIN 3.6 3.4* 3.2* 3.2*   Recent Labs  Lab 02/10/23 1349 02/10/23 1721  LATICACIDVEN 0.5 0.8   No results found for this or any previous visit (from the past 240 hours).  Antimicrobials/Microbiology: Anti-infectives (From admission, onward)    Start     Dose/Rate Route Frequency Ordered Stop   02/11/23 0900  piperacillin -tazobactam (ZOSYN ) IVPB 3.375 g        3.375 g 12.5 mL/hr over 240 Minutes Intravenous Every 8 hours 02/11/23 0815     02/10/23 1730  piperacillin -tazobactam (ZOSYN ) IVPB 3.375 g        3.375 g 12.5 mL/hr over 240 Minutes Intravenous  Once 02/10/23 1716 02/10/23 2130         Component Value Date/Time   SDES  01/12/2023 1359    BILE DRAIN Performed at [facility], [address] [place], [place] [address]    SPECREQUEST  01/12/2023 1359    NONE Performed at [facility], [address] [place], [place] [address]    CULT  01/12/2023 1359    No  growth aerobically or anaerobically. Performed at [facility], [address], [place], [place] [address]    REPTSTATUS 01/17/2023 FINAL 01/12/2023 1359     Radiology Studies: IR EXCHANGE BILIARY DRAIN Result Date: 02/12/2023 INDICATION: Calculus cholecystitis, painful cholecystostomy tube with daily flushing EXAM: FLUOROSCOPIC EXCHANGE OF THE 10 FRENCH CHOLECYSTOSTOMY MEDICATIONS: 1% lidocaine  local ANESTHESIA/SEDATION: None. FLUOROSCOPY: Radiation Exposure Index (as provided by the fluoroscopic device): 4.0 mGy Kerma COMPLICATIONS: None immediate. PROCEDURE: Informed written consent was obtained from the patient after a thorough discussion of the procedural risks, benefits and alternatives. All questions were addressed. Maximal Sterile Barrier Technique was utilized including caps, mask, sterile gowns, sterile gloves, sterile  drape, hand hygiene and skin antiseptic. A timeout was performed prior to the initiation of the procedure. Under sterile conditions and local anesthesia, the existing cholecystostomy was injected with contrast confirming position in the gallbladder fundus. Sludge and stones noted within the gallbladder. Cystic duct is obstructed. Catheter successfully exchanged over a Amplatz guidewire. Contrast injection reconfirms position. Catheter secured with a silk suture and connected to external gravity drainage bag. Sterile dressing applied. No immediate complication. Patient tolerated the procedure well. IMPRESSION: Successful fluoroscopic exchange of the 10 [place] cholecystostomy Electronically Signed   By: CHRISTELLA.  Shick M.D.   On: 02/12/2023 16:49   LOS: 3 days   Total time spent in review of labs and imaging, patient evaluation, formulation of plan, documentation and communication with family: 35 minutes  Mennie LAMY, MD [facility]  02/13/2023, 9:10 AM

## 2023-02-13 NOTE — Progress Notes (Signed)
 Mobility Specialist - Progress Note   02/13/23 0912  Mobility  Activity Ambulated independently in hallway  Level of Assistance Independent  Assistive Device None  Distance Ambulated (ft) 500 ft  Activity Response Tolerated well  Mobility Referral Yes  Mobility visit 1 Mobility  Mobility Specialist Start Time (ACUTE ONLY) 0901  Mobility Specialist Stop Time (ACUTE ONLY) 0912  Mobility Specialist Time Calculation (min) (ACUTE ONLY) 11 min   Pt received in bed and agreeable to mobility. No complaints during session. Pt to bed after session with all needs met.    [facility]

## 2023-02-14 ENCOUNTER — Encounter (HOSPITAL_COMMUNITY): Payer: Self-pay

## 2023-02-14 ENCOUNTER — Ambulatory Visit (HOSPITAL_COMMUNITY): Admit: 2023-02-14 | Payer: Medicare Other | Admitting: Gastroenterology

## 2023-02-14 ENCOUNTER — Encounter (HOSPITAL_COMMUNITY)
Admission: EM | Disposition: A | Discharge status: Home / Self Care | Payer: Self-pay | Source: Home / Self Care | Attending: Internal Medicine

## 2023-02-14 ENCOUNTER — Inpatient Hospital Stay (HOSPITAL_COMMUNITY): Payer: Medicare Other | Admitting: Anesthesiology

## 2023-02-14 ENCOUNTER — Encounter (HOSPITAL_COMMUNITY): Payer: Self-pay | Admitting: *Deleted

## 2023-02-14 DIAGNOSIS — Z87891 Personal history of nicotine dependence: Secondary | ICD-10-CM | POA: Diagnosis not present

## 2023-02-14 DIAGNOSIS — K819 Cholecystitis, unspecified: Secondary | ICD-10-CM | POA: Diagnosis not present

## 2023-02-14 DIAGNOSIS — J449 Chronic obstructive pulmonary disease, unspecified: Secondary | ICD-10-CM

## 2023-02-14 DIAGNOSIS — K838 Other specified diseases of biliary tract: Secondary | ICD-10-CM | POA: Diagnosis not present

## 2023-02-14 DIAGNOSIS — K7689 Other specified diseases of liver: Secondary | ICD-10-CM

## 2023-02-14 HISTORY — PX: EUS: SHX5427

## 2023-02-14 HISTORY — PX: ESOPHAGOGASTRODUODENOSCOPY: SHX5428

## 2023-02-14 LAB — HEPATIC FUNCTION PANEL
ALT: 20 U/L (ref 0–44)
AST: 18 U/L (ref 15–41)
Albumin: 3.2 g/dL — ABNORMAL LOW (ref 3.5–5.0)
Alkaline Phosphatase: 76 U/L (ref 38–126)
Bilirubin, Direct: 0.2 mg/dL (ref 0.0–0.2)
Indirect Bilirubin: 0.8 mg/dL (ref 0.3–0.9)
Total Bilirubin: 1 mg/dL (ref 0.0–1.2)
Total Protein: 6.5 g/dL (ref 6.5–8.1)

## 2023-02-14 LAB — BASIC METABOLIC PANEL
Anion gap: 11 (ref 5–15)
BUN: 16 mg/dL (ref 8–23)
CO2: 23 mmol/L (ref 22–32)
Calcium: 9 mg/dL (ref 8.9–10.3)
Chloride: 101 mmol/L (ref 98–111)
Creatinine, Ser: 1.08 mg/dL (ref 0.61–1.24)
GFR, Estimated: >60 mL/min (ref >60)
Glucose, Bld: 91 mg/dL (ref 70–99)
Potassium: 4.2 mmol/L (ref 3.5–5.1)
Sodium: 135 mmol/L (ref 135–145)

## 2023-02-14 LAB — CBC
HCT: 39.4 % (ref 39.0–52.0)
Hemoglobin: 12.6 g/dL — ABNORMAL LOW (ref 13.0–17.0)
MCH: 30.4 pg (ref 26.0–34.0)
MCHC: 32 g/dL (ref 30.0–36.0)
MCV: 94.9 fL (ref 80.0–100.0)
Platelets: 258 10*3/uL (ref 150–400)
RBC: 4.15 MIL/uL — ABNORMAL LOW (ref 4.22–5.81)
RDW: 14.6 % (ref 11.5–15.5)
WBC: 15.6 10*3/uL — ABNORMAL HIGH (ref 4.0–10.5)
nRBC: 0 % (ref 0.0–0.2)

## 2023-02-14 SURGERY — UPPER ESOPHAGEAL ENDOSCOPIC ULTRASOUND (EUS)
Anesthesia: Monitor Anesthesia Care | Laterality: Bilateral

## 2023-02-14 SURGERY — UPPER ENDOSCOPIC ULTRASOUND (EUS) LINEAR
Anesthesia: Monitor Anesthesia Care

## 2023-02-14 MED ORDER — SODIUM CHLORIDE 0.9 % IV SOLN: INTRAVENOUS | Status: DC | PRN

## 2023-02-14 MED ORDER — PROPOFOL 500 MG/50ML IV EMUL
INTRAVENOUS | Status: DC | PRN
  Administered 2023-02-14: 100 ug/kg/min via INTRAVENOUS
  Administered 2023-02-14 (×2): 30 ug via INTRAVENOUS

## 2023-02-14 NOTE — Plan of Care (Signed)
   Problem: Clinical Measurements: Goal: Diagnostic test results will improve Outcome: Progressing   Problem: Activity: Goal: Risk for activity intolerance will decrease Outcome: Progressing

## 2023-02-14 NOTE — Progress Notes (Signed)
 PROGRESS NOTE Aaron Stevens  WGN:[id number] DOB: Jun 18, 1955 DOA: 02/10/2023 PCP: [name], PA  Brief Narrative/Hospital Course: 68 y.o. male with medical history significant of atherosclerosis, emphysema, history of pneumonia, right eye cataract who was admitted last month from 01/09/2023 until 01/14/2023 due to acute cholecystitis with cholelithiasis undergoing a percutaneous drain placement on 01/12/2023 with GI recommending that the patient will go EUS in the future but he returned 02/10/23 with complaints of right upper quadrant pain associated with increased output from his percutaneous cholecystostomy drain and mild erythema at insertion site and his wife unable to flush the tube. In the ED hemodynamically stable afebrile. Labs with a stable CMP, mild leukocytosis 11 normal lactic acid. IR consulted to address drain.  GI and surgery has been consulted as well.  He will need EUS for evaluation of double duct sign at some point before cholecystectomy. 1/13: Cholecystostomy tube exchange by IR 1/14: CT [id number] in appropriate position with persistent gallbladder wall thickening and pericholecystic fluid. Persistent mild intrahepatic and extrahepatic biliary dilatation  Subjective: No pain this am Npo for EUS today Had abd spasm last night - CT abd was done last night Wbc went up this am 11>15k  Assessment and Plan: Principal Problem:   Cholecystitis Active Problems:   Emphysema of lung (HCC)   Tobacco use   Normocytic anemia   Cholelithiasis  Cholecystitis S/Pperc cholecystostomy recently 12/13 Still with cholecystitis Ampullary mass: CT showing drain in position but not sure functioning or not.GI and surgery following, 1/13 cholecystomy tube exchanged by IR. CT repeat abd 1/14> as above-for EUS today 2/2 double duct sign. Cont empiric Zosyn     Emphysema of lung: Resp status stable  Normocytic anemia: In the setting of chronic illness infection monitor  hemoglobin intermittently  DVT prophylaxis: SCDs Start: 02/10/23 1741 Code Status:   Code Status: Full Code Family Communication: plan of care discussed with patient at bedside. Patient status is: Remains hospitalized because of severity of illness Level of care: Med-Surg   Dispo: The patient is from: HOME W/ wife            Anticipated disposition: TBD  Objective: Vitals last 24 hrs: Vitals:   02/13/23 2032 02/13/23 2114 02/14/23 0518 02/14/23 1014  BP: 130/68 125/66 121/68 (!) 157/56  Pulse: 90 92 92 73  Resp: 18 16 18 12   Temp: 98.9 F (37.2 C) 98.3 F (36.8 C) 98.8 F (37.1 C) 98.1 F (36.7 C)  TempSrc: Oral Oral Oral Tympanic  SpO2: 90% 90% 95% 90%  Weight:    54.9 kg  Height:    5\' 6"  (1.676 m)   Weight change:   Physical Examination: General exam: alert awake HEENT:Oral mucosa moist, Ear/Nose WNL grossly Respiratory system: Bilaterally clear BS,no use of accessory muscle Cardiovascular system: S1 & S2 +, No JVD. Gastrointestinal system: Abdomen soft, RUQ Drain+ Nervous System: Alert, awake, moving all extremities,and following commands. Extremities: LE edema neg,distal peripheral pulses palpable and warm.  Skin: No rashes,no icterus. MSK: Normal muscle bulk,tone, power.  Medications reviewed:  Scheduled Meds:  [MAR Hold] gabapentin   300 mg Oral QHS   [MAR Hold] mometasone -formoterol   2 puff Inhalation BID   [MAR Hold] sodium chloride  flush  5 mL Intracatheter Q8H  Continuous Infusions:  sodium chloride  40 mL/hr at 02/13/23 1814   [MAR Hold] piperacillin -tazobactam (ZOSYN )  IV 3.375 g (02/14/23 0512)    Diet Order             Diet NPO time specified  Diet  effective midnight                  Intake/Output Summary (Last 24 hours) at 02/14/2023 1038 Last data filed at 02/14/2023 0600 Gross per 24 hour  Intake 644.84 ml  Output 1475 ml  Net -830.16 ml   Net IO Since Admission: 197.39 mL [02/14/23 1038]  Wt Readings from Last 3 Encounters:   02/14/23 54.9 kg  01/26/23 54.9 kg  01/09/23 55.7 kg     Unresulted Labs (From admission, onward)    None     Data Reviewed: I have personally reviewed following labs and imaging studies CBC: Recent Labs  Lab 02/10/23 1341 02/11/23 0527 02/12/23 0439 02/13/23 0446 02/14/23 0453  WBC 11.0* 12.4* 10.9* 11.5* 15.6*  NEUTROABS 6.6  --   --   --   --   HGB 12.5* 12.3* 12.2* 12.4* 12.6*  HCT 39.0 36.1* 35.4* 38.9* 39.4  MCV 95.6 91.6 91.7 94.9 94.9  PLT 262 249 217 241 258   Basic Metabolic Panel:  Recent Labs  Lab 02/10/23 1341 02/11/23 0527 02/12/23 0439 02/13/23 0446 02/14/23 0453  NA 135 134* 136 134* 135  K 4.8 4.7 4.1 4.4 4.2  CL 104 101 102 101 101  CO2 27 26 26 27 23   GLUCOSE 82 95 94 97 91  BUN 16 15 13 15 16   CREATININE 0.52* 0.93 1.00 1.01 1.08  CALCIUM 8.9 8.8* 8.7* 8.9 9.0   GFR: Estimated Creatinine Clearance: 50.8 mL/min (by C-G formula based on SCr of 1.08 mg/dL). Liver Function Tests:  Recent Labs  Lab 02/10/23 1341 02/11/23 0527 02/12/23 0439 02/13/23 0446 02/14/23 0453  AST 32 28 23 23 18   ALT 30 30 25 25 20   ALKPHOS 74 84 78 76 76  BILITOT 1.1 0.9 0.9 0.7 1.0  PROT 6.7 6.2* 6.0* 6.5 6.5  ALBUMIN 3.6 3.4* 3.2* 3.2* 3.2*   Recent Labs  Lab 02/10/23 1349 02/10/23 1721  LATICACIDVEN 0.5 0.8   No results found for this or any previous visit (from the past 240 hours).  Antimicrobials/Microbiology: Anti-infectives (From admission, onward)    Start     Dose/Rate Route Frequency Ordered Stop   02/11/23 0900  [MAR Hold]  piperacillin -tazobactam (ZOSYN ) IVPB 3.375 g        (MAR Hold since Wed 02/14/2023 at 1009.Hold Reason: Transfer to a Procedural area)   3.375 g 12.5 mL/hr over 240 Minutes Intravenous Every 8 hours 02/11/23 0815     02/10/23 1730  piperacillin -tazobactam (ZOSYN ) IVPB 3.375 g        3.375 g 12.5 mL/hr over 240 Minutes Intravenous  Once 02/10/23 1716 02/10/23 2130         Component Value Date/Time   SDES  01/12/2023  1359    BILE DRAIN Performed at [facility], [address], [place], [place] [address]    SPECREQUEST  01/12/2023 1359    NONE Performed at [facility] [place] [facility], [address], [place], [place] [address]    CULT  01/12/2023 1359    No growth aerobically or anaerobically. Performed at [facility], [address] [place], [place] [address]    REPTSTATUS 01/17/2023 FINAL 01/12/2023 1359     Radiology Studies: CT ABDOMEN PELVIS W CONTRAST Result Date: 02/13/2023 CLINICAL DATA:  Abdominal pain, acute, nonlocalized EXAM: CT ABDOMEN AND PELVIS WITH CONTRAST TECHNIQUE: Multidetector CT imaging of the abdomen and pelvis was performed using the standard protocol following bolus administration of intravenous contrast.  RADIATION DOSE REDUCTION: This exam was performed according to the departmental dose-optimization program which includes automated exposure control, adjustment of the mA and/or kV according to patient size and/or use of iterative reconstruction technique. CONTRAST:  OMNIPAQUE  IOHEXOL  300 MG/ML  SOLN COMPARISON:  CT abdomen pelvis 02/09/2022, MR abdomen 01/10/2023 FINDINGS: Lower chest: Severe atherosclerotic plaque.  Tiny hiatal hernia. Hepatobiliary: Subcentimeter hypodensities too small to characterize. Fluid density lesion within left hepatic lobe likely represents a simple hepatic cyst. Cholecystostomy tube with tip within the gallbladder lumen. Adenomyomatosis. Persistent gallbladder wall thickening and pericholecystic fluid. Persistent mild intrahepatic and extrahepatic biliary dilatation. Pancreas: No focal lesion. Normal pancreatic contour. No surrounding inflammatory changes. No main pancreatic ductal dilatation. Spleen: Normal in size without focal abnormality. Adrenals/Urinary Tract: Horseshoe kidney again noted. No adrenal nodule bilaterally. Bilateral kidneys enhance symmetrically. No hydronephrosis. No hydroureter. The urinary bladder  is unremarkable. Stomach/Bowel: Stomach is within normal limits. No evidence of bowel wall thickening or dilatation. The appendix is not definitely identified with no inflammatory changes in the right lower quadrant to suggest acute appendicitis. Vascular/Lymphatic: No abdominal aorta or iliac aneurysm. Severe atherosclerotic plaque of the aorta and its branches. No abdominal, pelvic, or inguinal lymphadenopathy. Reproductive: Prostate is enlarged measuring up to 4.8 cm. Other: No intraperitoneal free fluid. No intraperitoneal free gas. No organized fluid collection. Musculoskeletal: No abdominal wall hernia or abnormality. No suspicious lytic or blastic osseous lesions. No acute displaced fracture. 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 IMPRESSION: 1. Cholecystostomy in appropriate position with persistent gallbladder wall thickening and pericholecystic fluid. Persistent mild intrahepatic and extrahepatic biliary dilatation. Adenomyomatosis. 2. Tiny hiatal hernia. 3. Prostatomegaly. 4. Horseshoe kidney. 5.  Aortic Atherosclerosis (ICD10-I70.0). Electronically Signed   By: Morgane  Naveau M.D.   On: 02/13/2023 20:37   IR EXCHANGE BILIARY DRAIN Result Date: 02/12/2023 INDICATION: Calculus cholecystitis, painful cholecystostomy tube with daily flushing EXAM: FLUOROSCOPIC EXCHANGE OF THE 10 FRENCH CHOLECYSTOSTOMY MEDICATIONS: 1% lidocaine  local ANESTHESIA/SEDATION: None. FLUOROSCOPY: Radiation Exposure Index (as provided by the fluoroscopic device): 4.0 mGy Kerma COMPLICATIONS: None immediate. PROCEDURE: Informed written consent was obtained from the patient after a thorough discussion of the procedural risks, benefits and alternatives. All questions were addressed. Maximal Sterile Barrier Technique was utilized including caps, mask, sterile gowns, sterile gloves, sterile drape, hand hygiene and skin antiseptic. A timeout was performed prior to the initiation of the  procedure. Under sterile conditions and local anesthesia, the existing cholecystostomy was injected with contrast confirming position in the gallbladder fundus. Sludge and stones noted within the gallbladder. Cystic duct is obstructed. Catheter successfully exchanged over a Amplatz guidewire. Contrast injection reconfirms position. Catheter secured with a silk suture and connected to external gravity drainage bag. Sterile dressing applied. No immediate complication. Patient tolerated the procedure well. IMPRESSION: Successful fluoroscopic exchange of the 10 [place] cholecystostomy Electronically Signed   By: Melven Stable.  Shick M.D.   On: 02/12/2023 16:49   LOS: 4 days   Total time spent in review of labs and imaging, patient evaluation, formulation of plan, documentation and communication with family: 35 minutes  Lesa Rape, MD [facility]  02/14/2023, 10:38 AM

## 2023-02-14 NOTE — Anesthesia Preprocedure Evaluation (Addendum)
 Anesthesia Evaluation  Patient identified by MRN, date of birth, ID band Patient awake    Reviewed: Allergy & Precautions, NPO status , Patient's Chart, lab work & pertinent test results  History of Anesthesia Complications Negative for: history of anesthetic complications  Airway Mallampati: II  TM Distance: >3 FB Neck ROM: Full    Dental  (+) Dental Advisory Given, Partial Upper   Pulmonary COPD,  COPD inhaler, former smoker (quit 1 mo ago)   Pulmonary exam normal        Cardiovascular negative cardio ROS Normal cardiovascular exam     Neuro/Psych negative neurological ROS  negative psych ROS   GI/Hepatic negative GI ROS, Neg liver ROS,,,  Endo/Other  negative endocrine ROS    Renal/GU negative Renal ROS     Musculoskeletal negative musculoskeletal ROS (+)    Abdominal   Peds  Hematology  (+) Blood dyscrasia, anemia   Anesthesia Other Findings   Reproductive/Obstetrics                             Anesthesia Physical Anesthesia Plan  ASA: 3  Anesthesia Plan: MAC   Post-op Pain Management: Minimal or no pain anticipated   Induction:   PONV Risk Score and Plan: 1 and Propofol  infusion and Treatment may vary due to age or medical condition  Airway Management Planned: Nasal Cannula and Natural Airway  Additional Equipment: None  Intra-op Plan:   Post-operative Plan:   Informed Consent: I have reviewed the patients History and Physical, chart, labs and discussed the procedure including the risks, benefits and alternatives for the proposed anesthesia with the patient or authorized representative who has indicated his/her understanding and acceptance.       Plan Discussed with: CRNA and Anesthesiologist  Anesthesia Plan Comments:        Anesthesia Quick Evaluation

## 2023-02-14 NOTE — Progress Notes (Signed)
 Mobility Specialist - Progress Note   02/14/23 0843  Mobility  Activity Ambulated independently in hallway  Level of Assistance Independent  Assistive Device None  Distance Ambulated (ft) 275 ft  Activity Response Tolerated well  Mobility Referral Yes  Mobility visit 1 Mobility  Mobility Specialist Start Time (ACUTE ONLY) [id number]  Mobility Specialist Stop Time (ACUTE ONLY) 0843  Mobility Specialist Time Calculation (min) (ACUTE ONLY) 6 min   Pt received in bed and agreeable to mobility. No complaints during session. Pt to EOB after session with all needs met.    [facility]

## 2023-02-14 NOTE — Anesthesia Postprocedure Evaluation (Signed)
 Anesthesia Post Note  Patient: Aaron Stevens  Procedure(s) Performed: UPPER ENDOSCOPIC ULTRASOUND (EUS) LINEAR (Left) ESOPHAGOGASTRODUODENOSCOPY (EGD)     Patient location during evaluation: PACU Anesthesia Type: MAC Level of consciousness: awake and alert Pain management: pain level controlled Vital Signs Assessment: post-procedure vital signs reviewed and stable Respiratory status: spontaneous breathing, nonlabored ventilation and respiratory function stable Cardiovascular status: stable and blood pressure returned to baseline Anesthetic complications: no   No notable events documented.  Last Vitals:  Vitals:   02/14/23 1200 02/14/23 1205  BP: (!) 96/32 (!) 128/59  Pulse: 75 80  Resp: 16 13  Temp:    SpO2: 91% 94%    Last Pain:  Vitals:   02/14/23 1200  TempSrc:   PainSc: 0-No pain                 Juventino Oppenheim

## 2023-02-14 NOTE — Op Note (Signed)
 [facility] Patient Name: Aaron Stevens Procedure Date: 02/14/2023 MRN: [id number] Attending MD: Evangeline Hilts , MD, [id number] Date of Birth: 1955/05/19 CSN: [id number] Age: 68 Admit Type: Inpatient Procedure:                Upper EUS Indications:              Common bile duct dilation (acquired) seen on MRCP Providers:                Evangeline Hilts, MD, Ambrose Junk, RN, Joline Ned,                            Technician Referring MD:             [facility] Surgery. Medicines:                Monitored Anesthesia Care Complications:            No immediate complications. Estimated Blood Loss:     Estimated blood loss: none. Procedure:                Pre-Anesthesia Assessment:                           - Prior to the procedure, a History and Physical                            was performed, and patient medications and                            allergies were reviewed. The patient's tolerance of                            previous anesthesia was also reviewed. The risks                            and benefits of the procedure and the sedation                            options and risks were discussed with the patient.                            All questions were answered, and informed consent                            was obtained. Prior Anticoagulants: The patient has                            taken no anticoagulant or antiplatelet agents. ASA                            Grade Assessment: III - A patient with severe                            systemic disease. After reviewing the risks and  benefits, the patient was deemed in satisfactory                            condition to undergo the procedure.                           After obtaining informed consent, the endoscope was                            passed under direct vision. Throughout the                            procedure, the patient's blood pressure, pulse, and                             oxygen saturations were monitored continuously. The                            [id number] ([id number]) Olympus linear ultrasound scope                            was introduced through the mouth, and advanced to                            the second part of duodenum. The upper EUS was                            accomplished without difficulty. The patient                            tolerated the procedure well. Scope In: Scope Out: Findings:      ENDOSONOGRAPHIC FINDING: :      There was dilation in the common bile duct which measured up to 8 mm.      A few benign-appearing lymph nodes were visualized in the porta hepatis       region. The nodes were triangular, hypoechoic and had well defined       margins. Benign and reactive-appearing.      A cyst was found in the left lobe of the liver and measured 10 mm by 10       mm in maximal cross-sectional diameter. The cyst was anechoic. It was       without septae. The outer wall of the lesion was not seen.      There was no sign of significant endosonographic abnormality in the left       lobe of the liver. Homogeneous parenchyma was identified.      Pancreatic parenchymal abnormalities were noted in the entire pancreas.       These consisted of hyperechoic strands, hyperechoic foci and lobularity.       Pancreatic duct maximally 3 mm in the head/genu. No pancreatic mass seen.      Inflammatory changes seen perihepatic and periportal regions.      There was no sign of significant endosonographic abnormality in the       ampulla. Impression:               - There was trace  dilation in the common bile duct                            which measured up to 8 mm.                           - A few benign lymph nodes were visualized in the                            porta hepatis region.                           - A cyst was found in the left lobe of the liver                            and measured 10 mm by 10 mm.                            - There was no evidence of significant pathology in                            the left lobe of the liver.                           - Pancreatic parenchymal abnormalities consisting                            of hyperechoic strands, hyperechoic foci and                            lobularity were noted in the entire pancreas.                            Consistent with chronic pancreatitis which in and                            of itself can cause mildly prominent bile duct and                            pancreatic duct.                           - There was no sign of significant pathology in the                            ampulla. No obvious pancreatic mass noted.                           - No specimens collected. Moderate Sedation:      None Recommendation:           - Return patient to hospital ward for ongoing care.                           - Soft diet today.                           -  Continue present medications.                           - Can proceed with cholecystectomy at discretion of                            the surgical team.                           - [facility] will sign-off; we can arrange outpatient                            follow-up with us  if desired; please call with any                            questions; thank you for the consultation. Procedure Code(s):        --- Professional ---                           [phone], Esophagogastroduodenoscopy, flexible,                            transoral; with endoscopic ultrasound examination                            limited to the esophagus, stomach or duodenum, and                            adjacent structures Diagnosis Code(s):        --- Professional ---                           K83.8, Other specified diseases of biliary tract                           I89.9, Noninfective disorder of lymphatic vessels                            and lymph nodes, unspecified                           K76.89, Other  specified diseases of liver                           K86.9, Disease of pancreas, unspecified CPT copyright 2022 American [employer] Association. All rights reserved. The codes documented in this report are preliminary and upon coder review may  be revised to meet current compliance requirements. Evangeline Hilts, MD 02/14/2023 11:49:01 AM This report has been signed electronically. Number of Addenda: 0

## 2023-02-14 NOTE — Progress Notes (Signed)
 Subjective/Chief Complaint: Feels better today, awaiting eus   Objective: Vital signs in last 24 hours: Temp:  [98.3 F (36.8 C)-98.9 F (37.2 C)] 98.8 F (37.1 C) (01/15 0518) Pulse Rate:  [69-92] 92 (01/15 0518) Resp:  [16-18] 18 (01/15 0518) BP: (111-130)/(63-68) 121/68 (01/15 0518) SpO2:  [90 %-96 %] 95 % (01/15 0518) Last BM Date : 02/12/23  Intake/Output from previous day: 01/14 0701 - 01/15 0700 In: 644.8 [P.O.:360; I.V.:142.6; IV Piggyback:142.2] Out: 1485 [Urine:1400; Drains:85] Intake/Output this shift: No intake/output data recorded.  Ab soft nontender drain in place   Lab Results:  Recent Labs    02/13/23 0446 02/14/23 0453  WBC 11.5* 15.6*  HGB 12.4* 12.6*  HCT 38.9* 39.4  PLT 241 258   BMET Recent Labs    02/13/23 0446 02/14/23 0453  NA 134* 135  K 4.4 4.2  CL 101 101  CO2 27 23  GLUCOSE 97 91  BUN 15 16  CREATININE 1.01 1.08  CALCIUM 8.9 9.0   PT/INR No results for input(s): "LABPROT", "INR" in the last 72 hours. ABG No results for input(s): "PHART", "HCO3" in the last 72 hours.  Invalid input(s): "PCO2", "PO2"  Studies/Results: CT ABDOMEN PELVIS W CONTRAST Result Date: 02/13/2023 CLINICAL DATA:  Abdominal pain, acute, nonlocalized EXAM: CT ABDOMEN AND PELVIS WITH CONTRAST TECHNIQUE: Multidetector CT imaging of the abdomen and pelvis was performed using the standard protocol following bolus administration of intravenous contrast. RADIATION DOSE REDUCTION: This exam was performed according to the departmental dose-optimization program which includes automated exposure control, adjustment of the mA and/or kV according to patient size and/or use of iterative reconstruction technique. CONTRAST:  OMNIPAQUE  IOHEXOL  300 MG/ML  SOLN COMPARISON:  CT abdomen pelvis 02/09/2022, MR abdomen 01/10/2023 FINDINGS: Lower chest: Severe atherosclerotic plaque.  Tiny hiatal hernia. Hepatobiliary: Subcentimeter hypodensities too small to characterize.  Fluid density lesion within left hepatic lobe likely represents a simple hepatic cyst. Cholecystostomy tube with tip within the gallbladder lumen. Adenomyomatosis. Persistent gallbladder wall thickening and pericholecystic fluid. Persistent mild intrahepatic and extrahepatic biliary dilatation. Pancreas: No focal lesion. Normal pancreatic contour. No surrounding inflammatory changes. No main pancreatic ductal dilatation. Spleen: Normal in size without focal abnormality. Adrenals/Urinary Tract: Horseshoe kidney again noted. No adrenal nodule bilaterally. Bilateral kidneys enhance symmetrically. No hydronephrosis. No hydroureter. The urinary bladder is unremarkable. Stomach/Bowel: Stomach is within normal limits. No evidence of bowel wall thickening or dilatation. The appendix is not definitely identified with no inflammatory changes in the right lower quadrant to suggest acute appendicitis. Vascular/Lymphatic: No abdominal aorta or iliac aneurysm. Severe atherosclerotic plaque of the aorta and its branches. No abdominal, pelvic, or inguinal lymphadenopathy. Reproductive: Prostate is enlarged measuring up to 4.8 cm. Other: No intraperitoneal free fluid. No intraperitoneal free gas. No organized fluid collection. Musculoskeletal: No abdominal wall hernia or abnormality. No suspicious lytic or blastic osseous lesions. No acute displaced fracture. 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 IMPRESSION: 1. Cholecystostomy in appropriate position with persistent gallbladder wall thickening and pericholecystic fluid. Persistent mild intrahepatic and extrahepatic biliary dilatation. Adenomyomatosis. 2. Tiny hiatal hernia. 3. Prostatomegaly. 4. Horseshoe kidney. 5.  Aortic Atherosclerosis (ICD10-I70.0). Electronically Signed   By: Morgane  Naveau M.D.   On: 02/13/2023 20:37   IR EXCHANGE BILIARY DRAIN Result Date: 02/12/2023 INDICATION: Calculus cholecystitis, painful  cholecystostomy tube with daily flushing EXAM: FLUOROSCOPIC EXCHANGE OF THE 10 FRENCH CHOLECYSTOSTOMY MEDICATIONS: 1% lidocaine  local ANESTHESIA/SEDATION: None. FLUOROSCOPY: Radiation Exposure Index (as provided by the fluoroscopic  device): 4.0 mGy Kerma COMPLICATIONS: None immediate. PROCEDURE: Informed written consent was obtained from the patient after a thorough discussion of the procedural risks, benefits and alternatives. All questions were addressed. Maximal Sterile Barrier Technique was utilized including caps, mask, sterile gowns, sterile gloves, sterile drape, hand hygiene and skin antiseptic. A timeout was performed prior to the initiation of the procedure. Under sterile conditions and local anesthesia, the existing cholecystostomy was injected with contrast confirming position in the gallbladder fundus. Sludge and stones noted within the gallbladder. Cystic duct is obstructed. Catheter successfully exchanged over a Amplatz guidewire. Contrast injection reconfirms position. Catheter secured with a silk suture and connected to external gravity drainage bag. Sterile dressing applied. No immediate complication. Patient tolerated the procedure well. IMPRESSION: Successful fluoroscopic exchange of the 10 [place] cholecystostomy Electronically Signed   By: Melven Stable.  Shick M.D.   On: 02/12/2023 16:49    Anti-infectives: Anti-infectives (From admission, onward)    Start     Dose/Rate Route Frequency Ordered Stop   02/11/23 0900  piperacillin -tazobactam (ZOSYN ) IVPB 3.375 g        3.375 g 12.5 mL/hr over 240 Minutes Intravenous Every 8 hours 02/11/23 0815     02/10/23 1730  piperacillin -tazobactam (ZOSYN ) IVPB 3.375 g        3.375 g 12.5 mL/hr over 240 Minutes Intravenous  Once 02/10/23 1716 02/10/23 2130       Assessment/Plan: Cholecystitis s/p perc chole -tube exchanged -will continue abx -await eus, appreciate moving this up, so we can decide next steps -I discussed this with he and his wife this  am again  I reviewed hospitalist notes, last 24 h vitals and pain scores, last 48 h intake and output, last 24 h labs and trends, and last 24 h imaging results.   Enid Harry 02/14/2023

## 2023-02-14 NOTE — Anesthesia Procedure Notes (Signed)
 Procedure Name: MAC Date/Time: 02/14/2023 11:18 AM  Performed by: Mickie Badders, CRNAOxygen Delivery Method: Simple face mask

## 2023-02-14 NOTE — Transfer of Care (Signed)
 Immediate Anesthesia Transfer of Care Note  Patient: Aaron Stevens  Procedure(s) Performed: UPPER ENDOSCOPIC ULTRASOUND (EUS) LINEAR (Left) ESOPHAGOGASTRODUODENOSCOPY (EGD)  Patient Location: PACU  Anesthesia Type:MAC  Level of Consciousness: drowsy  Airway & Oxygen Therapy: Patient Spontanous Breathing and Patient connected to face mask  Post-op Assessment: Report given to RN and Post -op Vital signs reviewed and stable  Post vital signs: Reviewed and stable  Last Vitals:  Vitals Value Taken Time  BP    Temp    Pulse    Resp    SpO2      Last Pain:  Vitals:   02/14/23 1014  TempSrc: Tympanic  PainSc: 0-No pain      Patients Stated Pain Goal: 2 (02/14/23 0834)  Complications: No notable events documented.

## 2023-02-14 NOTE — Interval H&P Note (Signed)
 History and Physical Interval Note:  02/14/2023 11:01 AM  Aaron Stevens  has presented today for surgery, with the diagnosis of dilated bile and pancreatic duct.  The various methods of treatment have been discussed with the patient and family. After consideration of risks, benefits and other options for treatment, the patient has consented to  Procedure(s): UPPER ENDOSCOPIC ULTRASOUND (EUS) LINEAR (Left) ESOPHAGOGASTRODUODENOSCOPY (EGD) (N/A) as a surgical intervention.  The patient's history has been reviewed, patient examined, no change in status, stable for surgery.  I have reviewed the patient's chart and labs.  Questions were answered to the patient's satisfaction.     Yves Herb

## 2023-02-15 DIAGNOSIS — K819 Cholecystitis, unspecified: Secondary | ICD-10-CM | POA: Diagnosis not present

## 2023-02-15 LAB — SURGICAL PCR SCREEN
MRSA, PCR: NEGATIVE
Staphylococcus aureus: NEGATIVE

## 2023-02-15 LAB — CBC
HCT: 35.5 % — ABNORMAL LOW (ref 39.0–52.0)
Hemoglobin: 11.7 g/dL — ABNORMAL LOW (ref 13.0–17.0)
MCH: 30.5 pg (ref 26.0–34.0)
MCHC: 33 g/dL (ref 30.0–36.0)
MCV: 92.7 fL (ref 80.0–100.0)
Platelets: 243 10*3/uL (ref 150–400)
RBC: 3.83 MIL/uL — ABNORMAL LOW (ref 4.22–5.81)
RDW: 14.6 % (ref 11.5–15.5)
WBC: 11.9 10*3/uL — ABNORMAL HIGH (ref 4.0–10.5)
nRBC: 0 % (ref 0.0–0.2)

## 2023-02-15 MED ORDER — INDOCYANINE GREEN 25 MG IV SOLR: 2.5000 mg | Freq: Once | INTRAVENOUS | Status: DC

## 2023-02-15 MED ORDER — HEPARIN SODIUM (PORCINE) 5000 UNIT/ML IJ SOLN
5000.0000 [IU] | Freq: Three times a day (TID) | INTRAMUSCULAR | Status: DC
  Administered 2023-02-15 – 2023-02-16 (×3): 5000 [IU] via SUBCUTANEOUS
  Filled 2023-02-15 (×3): qty 1

## 2023-02-15 NOTE — Progress Notes (Signed)
Progress Note  1 Day Post-Op  Subjective: Pt reports some persistent RUQ pain at drain site. He denies n/v. We discussed that unfortunately no time to proceed to OR today due to other cases of more emergent nature. Tentatively plan for OR tomorrow. Questions from patient and his wife welcomed and answered to the best of my ability.   Objective: Vital signs in last 24 hours: Temp:  [98.1 F (36.7 C)-99.1 F (37.3 C)] 98.7 F (37.1 C) (01/16 0423) Pulse Rate:  [73-80] 73 (01/16 0423) Resp:  [12-18] 18 (01/16 0423) BP: (96-157)/(32-66) 113/60 (01/16 0423) SpO2:  [90 %-100 %] 92 % (01/16 0423) Weight:  [54.9 kg] 54.9 kg (01/15 1014) Last BM Date : 02/12/23  Intake/Output from previous day: 01/15 0701 - 01/16 0700 In: 1103.4 [P.O.:840; I.V.:100; IV Piggyback:153.4] Out: 575 [Urine:500; Drains:75] Intake/Output this shift: No intake/output data recorded.  PE: General: pleasant, WD, cachectic male who is laying in bed in NAD HEENT: sclera anicteric  Heart: regular, rate, and rhythm.  Lungs: Respiratory effort nonlabored Abd: soft, mildly ttp over drain with thin bile tinged fluid, ND Psych: A&Ox3 with an appropriate affect.    Lab Results:  Recent Labs    02/13/23 0446 02/14/23 0453  WBC 11.5* 15.6*  HGB 12.4* 12.6*  HCT 38.9* 39.4  PLT 241 258   BMET Recent Labs    02/13/23 0446 02/14/23 0453  NA 134* 135  K 4.4 4.2  CL 101 101  CO2 27 23  GLUCOSE 97 91  BUN 15 16  CREATININE 1.01 1.08  CALCIUM 8.9 9.0   PT/INR No results for input(s): "LABPROT", "INR" in the last 72 hours. CMP     Component Value Date/Time   NA 135 02/14/2023 0453   K 4.2 02/14/2023 0453   CL 101 02/14/2023 0453   CO2 23 02/14/2023 0453   GLUCOSE 91 02/14/2023 0453   BUN 16 02/14/2023 0453   CREATININE 1.08 02/14/2023 0453   CREATININE 0.79 01/26/2023 1435   CALCIUM 9.0 02/14/2023 0453   PROT 6.5 02/14/2023 0453   ALBUMIN 3.2 (L) 02/14/2023 0453   AST 18 02/14/2023 0453   ALT  20 02/14/2023 0453   ALKPHOS 76 02/14/2023 0453   BILITOT 1.0 02/14/2023 0453   GFRNONAA >60 02/14/2023 0453   GFRAA >60 06/11/2018 2016   Lipase     Component Value Date/Time   LIPASE 28 01/09/2023 1543       Studies/Results: CT ABDOMEN PELVIS W CONTRAST Result Date: 02/13/2023 CLINICAL DATA:  Abdominal pain, acute, nonlocalized EXAM: CT ABDOMEN AND PELVIS WITH CONTRAST TECHNIQUE: Multidetector CT imaging of the abdomen and pelvis was performed using the standard protocol following bolus administration of intravenous contrast. RADIATION DOSE REDUCTION: This exam was performed according to the departmental dose-optimization program which includes automated exposure control, adjustment of the mA and/or kV according to patient size and/or use of iterative reconstruction technique. CONTRAST:  OMNIPAQUE IOHEXOL 300 MG/ML  SOLN COMPARISON:  CT abdomen pelvis 02/09/2022, MR abdomen 01/10/2023 FINDINGS: Lower chest: Severe atherosclerotic plaque.  Tiny hiatal hernia. Hepatobiliary: Subcentimeter hypodensities too small to characterize. Fluid density lesion within left hepatic lobe likely represents a simple hepatic cyst. Cholecystostomy tube with tip within the gallbladder lumen. Adenomyomatosis. Persistent gallbladder wall thickening and pericholecystic fluid. Persistent mild intrahepatic and extrahepatic biliary dilatation. Pancreas: No focal lesion. Normal pancreatic contour. No surrounding inflammatory changes. No main pancreatic ductal dilatation. Spleen: Normal in size without focal abnormality. Adrenals/Urinary Tract: Horseshoe kidney again noted. No adrenal  nodule bilaterally. Bilateral kidneys enhance symmetrically. No hydronephrosis. No hydroureter. The urinary bladder is unremarkable. Stomach/Bowel: Stomach is within normal limits. No evidence of bowel wall thickening or dilatation. The appendix is not definitely identified with no inflammatory changes in the right lower quadrant to  suggest acute appendicitis. Vascular/Lymphatic: No abdominal aorta or iliac aneurysm. Severe atherosclerotic plaque of the aorta and its branches. No abdominal, pelvic, or inguinal lymphadenopathy. Reproductive: Prostate is enlarged measuring up to 4.8 cm. Other: No intraperitoneal free fluid. No intraperitoneal free gas. No organized fluid collection. Musculoskeletal: No abdominal wall hernia or abnormality. No suspicious lytic or blastic osseous lesions. No acute displaced fracture. 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 IMPRESSION: 1. Cholecystostomy in appropriate position with persistent gallbladder wall thickening and pericholecystic fluid. Persistent mild intrahepatic and extrahepatic biliary dilatation. Adenomyomatosis. 2. Tiny hiatal hernia. 3. Prostatomegaly. 4. Horseshoe kidney. 5.  Aortic Atherosclerosis (ICD10-I70.0). Electronically Signed   By: Tish Frederickson M.D.   On: 02/13/2023 20:37    Anti-infectives: Anti-infectives (From admission, onward)    Start     Dose/Rate Route Frequency Ordered Stop   02/11/23 0900  piperacillin-tazobactam (ZOSYN) IVPB 3.375 g        3.375 g 12.5 mL/hr over 240 Minutes Intravenous Every 8 hours 02/11/23 0815     02/10/23 1730  piperacillin-tazobactam (ZOSYN) IVPB 3.375 g        3.375 g 12.5 mL/hr over 240 Minutes Intravenous  Once 02/10/23 1716 02/10/23 2130        Assessment/Plan  Cholecystitis with percutaneous cholecystostomy in place - tube exchanged 1/13 and showed persistent cystic duct occlusion  - s/p EUS yesterday which did not show any ampullary or pancreatic lesion, cystic lesion in liver noted - wbc up slightly and patient reports persistent RUQ pain at drain site - seems reasonable at this point to proceed with laparoscopic cholecystectomy given no findings of malignancy or concern for ampullary lesion - plan lap chole likely tomorrow pending OR availability  - I have explained the  procedure, risks, and aftercare of Laparoscopic cholecystectomy.  Risks include but are not limited to anesthesia (MI, CVA, death, prolonged intubation and aspiration), bleeding, infection, wound problems, hernia, bile leak, injury to common bile duct/liver/intestine, possible need for subtotal cholecystectomy or open cholecystectomy, increased risk of DVT/PE and diarrhea post op.  He seems to understand and agrees to proceed.   FEN: soft diet, NPO after MN VTE: SQH ID: [email or web address]   LOS: 5 days   I reviewed [facility] notes, hospitalist notes, last 24 h vitals and pain scores, last 48 h intake and output, last 24 h labs and trends, and last 24 h imaging results.  This care required high  level of medical decision making.    Juliet Rude, [facility] Surgery 02/15/2023, 9:20 AM Please see Amion for pager number during day hours 7:00am-4:30pm

## 2023-02-15 NOTE — Progress Notes (Signed)
PROGRESS NOTE Aaron Stevens  MWU:[id number] DOB: 1956/01/11 DOA: 02/10/2023 PCP: Maretta Bees, PA  Brief Narrative/Hospital Course: 68 y.o. male with medical history significant of atherosclerosis, emphysema, history of pneumonia, right eye cataract who was admitted last month from 01/09/2023 until 01/14/2023 due to acute cholecystitis with cholelithiasis undergoing a percutaneous drain placement on 01/12/2023 with GI recommending that the patient will go EUS in the future but he returned 02/10/23 with complaints of right upper quadrant pain associated with increased output from his percutaneous cholecystostomy drain and mild erythema at insertion site and his wife unable to flush the tube. In the ED hemodynamically stable afebrile. Labs with a stable CMP, mild leukocytosis 11 normal lactic acid. IR consulted to address drain.  GI and surgery has been consulted as well.  He will need EUS for evaluation of double duct sign at some point before cholecystectomy. 1/13: Cholecystostomy tube exchange by IR 1/14: CT VOZ:[id number] in appropriate position with persistent gallbladder wall thickening and pericholecystic fluid. Persistent mild intrahepatic and extrahepatic biliary dilatation 1/15: EUS> no significant abnormality in the ampulla, CBD 8 mm few benign lymph nodes visualized cyst was found in the left lobe of the liver 10 x 10, finding of chronic pancreatitis noticed, and GI advised to proceed with cholecystectomy.  Subjective: -Seen and examined Overnight afebrile, labs with worsening WBC count Improved at 11.9 He states his surgery at 1130 tomorrow No pain today  Assessment and Plan: Principal Problem:   Cholecystitis Active Problems:   Emphysema of lung (HCC)   Tobacco use   Normocytic anemia   Cholelithiasis  Acute cholecystitis s/p perc cholecystostomy recently 12/13> exchanged 1/13 Ampullary mass Ruled out: CT showing drain in position but not sure functioning or  not.GI and surgery following 1/13 cholecystomy tube exchanged.1/15: EUS> no significant abnormality in the ampulla, CBD 8 mm few benign lymph nodes visualized cyst was found in the left lobe of the liver 10 x 10, finding of chronic pancreatitis noticed, and GI advised to proceed with cholecystectomy, CCS aware.  Continue Zosyn, monitor CBC. CCS planning for cholecystectomy tentatively 02/16/23  Chronic pancreatitis per EUS: Will need outpatient GI follow-up    Emphysema of lung: Resp status stable  Normocytic anemia: In the setting of chronic illness infection monitor hemoglobin intermittently  DVT prophylaxis: heparin injection 5,000 Units Start: 02/15/23 1400 SCDs Start: 02/10/23 1741 Code Status:   Code Status: Full Code Family Communication: plan of care discussed with patient at bedside. Patient status is: Remains hospitalized because of severity of illness Level of care: Med-Surg   Dispo: The patient is from: HOME W/ wife            Anticipated disposition: TBD  Objective: Vitals last 24 hrs: Vitals:   02/14/23 1412 02/14/23 1931 02/14/23 2049 02/15/23 0423  BP: 122/66  132/63 113/60  Pulse: 74  78 73  Resp: 18  18 18   Temp: 99.1 F (37.3 C)  98.4 F (36.9 C) 98.7 F (37.1 C)  TempSrc: Oral  Oral Oral  SpO2: 92% 92% 93% 92%  Weight:      Height:       Weight change:   Physical Examination: General exam: alert awake, oriented HEENT:Oral mucosa moist, Ear/Nose WNL grossly Respiratory system: Bilaterally clear BS,no use of accessory muscle Cardiovascular system: S1 & S2 +, No JVD. Gastrointestinal system: Abdomen soft, RUQ w/ gb drain+ tender RUQ,ND, BS+ Nervous System: Alert, awake, moving all extremities,and following commands. Extremities: LE edema neg,distal peripheral pulses palpable and warm.  Skin: No rashes,no icterus. MSK: Normal muscle bulk,tone, power   Medications reviewed:  Scheduled Meds:  gabapentin  300 mg Oral QHS   heparin injection  (subcutaneous)  5,000 Units Subcutaneous Q8H   [START ON 02/16/2023] indocyanine green  2.5 mg Intravenous Once   mometasone-formoterol  2 puff Inhalation BID   sodium chloride flush  5 mL Intracatheter Q8H  Continuous Infusions:  piperacillin-tazobactam (ZOSYN)  IV 3.375 g (02/15/23 0525)    Diet Order             Diet NPO time specified Except for: Sips with Meds  Diet effective midnight           DIET SOFT Fluid consistency: Thin  Diet effective now                  Intake/Output Summary (Last 24 hours) at 02/15/2023 1057 Last data filed at 02/15/2023 0948 Gross per 24 hour  Intake 1343.4 ml  Output 575 ml  Net 768.4 ml   Net IO Since Admission: 965.79 mL [02/15/23 1057]  Wt Readings from Last 3 Encounters:  02/14/23 54.9 kg  01/26/23 54.9 kg  01/09/23 55.7 kg     Unresulted Labs (From admission, onward)     Start     Ordered   02/16/23 0500  CBC  Daily,   R      02/15/23 0835   02/16/23 0500  Comprehensive metabolic panel  Daily,   R      02/15/23 0835          Data Reviewed: I have personally reviewed following labs and imaging studies CBC: Recent Labs  Lab 02/10/23 1341 02/11/23 0527 02/12/23 0439 02/13/23 0446 02/14/23 0453 02/15/23 0906  WBC 11.0* 12.4* 10.9* 11.5* 15.6* 11.9*  NEUTROABS 6.6  --   --   --   --   --   HGB 12.5* 12.3* 12.2* 12.4* 12.6* 11.7*  HCT 39.0 36.1* 35.4* 38.9* 39.4 35.5*  MCV 95.6 91.6 91.7 94.9 94.9 92.7  PLT 262 249 217 241 258 243   Basic Metabolic Panel:  Recent Labs  Lab 02/10/23 1341 02/11/23 0527 02/12/23 0439 02/13/23 0446 02/14/23 0453  NA 135 134* 136 134* 135  K 4.8 4.7 4.1 4.4 4.2  CL 104 101 102 101 101  CO2 27 26 26 27 23   GLUCOSE 82 95 94 97 91  BUN 16 15 13 15 16   CREATININE 0.52* 0.93 1.00 1.01 1.08  CALCIUM 8.9 8.8* 8.7* 8.9 9.0   GFR: Estimated Creatinine Clearance: 50.8 mL/min (by C-G formula based on SCr of 1.08 mg/dL). Liver Function Tests:  Recent Labs  Lab 02/10/23 1341  02/11/23 0527 02/12/23 0439 02/13/23 0446 02/14/23 0453  AST 32 28 23 23 18   ALT 30 30 25 25 20   ALKPHOS 74 84 78 76 76  BILITOT 1.1 0.9 0.9 0.7 1.0  PROT 6.7 6.2* 6.0* 6.5 6.5  ALBUMIN 3.6 3.4* 3.2* 3.2* 3.2*   Recent Labs  Lab 02/10/23 1349 02/10/23 1721  LATICACIDVEN 0.5 0.8   No results found for this or any previous visit (from the past 240 hours).  Antimicrobials/Microbiology: Anti-infectives (From admission, onward)    Start     Dose/Rate Route Frequency Ordered Stop   02/11/23 0900  piperacillin-tazobactam (ZOSYN) IVPB 3.375 g        3.375 g 12.5 mL/hr over 240 Minutes Intravenous Every 8 hours 02/11/23 0815     02/10/23 1730  piperacillin-tazobactam (ZOSYN) IVPB 3.375 g  3.375 g 12.5 mL/hr over 240 Minutes Intravenous  Once 02/10/23 1716 02/10/23 2130         Component Value Date/Time   SDES  01/12/2023 1359    BILE DRAIN Performed at [facility] [address] [place], [place] [address]    SPECREQUEST  01/12/2023 1359    NONE Performed at [facility], [address] [place], [place] [address]    CULT  01/12/2023 1359    No growth aerobically or anaerobically. Performed at [facility], [address] [place], [place] [address]    REPTSTATUS 01/17/2023 FINAL 01/12/2023 1359     Radiology Studies: CT ABDOMEN PELVIS W CONTRAST Result Date: 02/13/2023 CLINICAL DATA:  Abdominal pain, acute, nonlocalized EXAM: CT ABDOMEN AND PELVIS WITH CONTRAST TECHNIQUE: Multidetector CT imaging of the abdomen and pelvis was performed using the standard protocol following bolus administration of intravenous contrast. RADIATION DOSE REDUCTION: This exam was performed according to the departmental dose-optimization program which includes automated exposure control, adjustment of the mA and/or kV according to patient size and/or use of iterative reconstruction technique. CONTRAST:  OMNIPAQUE IOHEXOL 300 MG/ML  SOLN  COMPARISON:  CT abdomen pelvis 02/09/2022, MR abdomen 01/10/2023 FINDINGS: Lower chest: Severe atherosclerotic plaque.  Tiny hiatal hernia. Hepatobiliary: Subcentimeter hypodensities too small to characterize. Fluid density lesion within left hepatic lobe likely represents a simple hepatic cyst. Cholecystostomy tube with tip within the gallbladder lumen. Adenomyomatosis. Persistent gallbladder wall thickening and pericholecystic fluid. Persistent mild intrahepatic and extrahepatic biliary dilatation. Pancreas: No focal lesion. Normal pancreatic contour. No surrounding inflammatory changes. No main pancreatic ductal dilatation. Spleen: Normal in size without focal abnormality. Adrenals/Urinary Tract: Horseshoe kidney again noted. No adrenal nodule bilaterally. Bilateral kidneys enhance symmetrically. No hydronephrosis. No hydroureter. The urinary bladder is unremarkable. Stomach/Bowel: Stomach is within normal limits. No evidence of bowel wall thickening or dilatation. The appendix is not definitely identified with no inflammatory changes in the right lower quadrant to suggest acute appendicitis. Vascular/Lymphatic: No abdominal aorta or iliac aneurysm. Severe atherosclerotic plaque of the aorta and its branches. No abdominal, pelvic, or inguinal lymphadenopathy. Reproductive: Prostate is enlarged measuring up to 4.8 cm. Other: No intraperitoneal free fluid. No intraperitoneal free gas. No organized fluid collection. Musculoskeletal: No abdominal wall hernia or abnormality. No suspicious lytic or blastic osseous lesions. No acute displaced fracture. 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 IMPRESSION: 1. Cholecystostomy in appropriate position with persistent gallbladder wall thickening and pericholecystic fluid. Persistent mild intrahepatic and extrahepatic biliary dilatation. Adenomyomatosis. 2. Tiny hiatal hernia. 3. Prostatomegaly. 4. Horseshoe kidney. 5.  Aortic  Atherosclerosis (ICD10-I70.0). Electronically Signed   By: Tish Frederickson M.D.   On: 02/13/2023 20:37   LOS: 5 days   Total time spent in review of labs and imaging, patient evaluation, formulation of plan, documentation and communication with family: 35 minutes  Lanae Boast, MD [facility]  02/15/2023, 10:57 AM

## 2023-02-15 NOTE — Progress Notes (Signed)
Mobility Specialist - Progress Note   02/15/23 0927  Mobility  Activity Ambulated independently in hallway  Level of Assistance Independent  Assistive Device None  Distance Ambulated (ft) 500 ft  Activity Response Tolerated well  Mobility Referral Yes  Mobility visit 1 Mobility  Mobility Specialist Start Time (ACUTE ONLY) [phone]  Mobility Specialist Stop Time (ACUTE ONLY) 0926  Mobility Specialist Time Calculation (min) (ACUTE ONLY) 9 min   Pt received in bed and agreeable to mobility. Pt had some coughing throughout session. No complaints during session. Pt to EOB after session with all needs met.     [facility]

## 2023-02-16 ENCOUNTER — Encounter (HOSPITAL_COMMUNITY): Payer: Self-pay | Admitting: Internal Medicine

## 2023-02-16 ENCOUNTER — Other Ambulatory Visit: Payer: Self-pay

## 2023-02-16 ENCOUNTER — Ambulatory Visit: Payer: Medicare Other | Admitting: Urgent Care

## 2023-02-16 ENCOUNTER — Inpatient Hospital Stay (HOSPITAL_COMMUNITY): Payer: Medicare Other | Admitting: Certified Registered"

## 2023-02-16 ENCOUNTER — Encounter (HOSPITAL_COMMUNITY)
Admission: EM | Disposition: A | Discharge status: Home / Self Care | Payer: Self-pay | Source: Home / Self Care | Attending: Internal Medicine

## 2023-02-16 DIAGNOSIS — K810 Acute cholecystitis: Secondary | ICD-10-CM | POA: Diagnosis not present

## 2023-02-16 DIAGNOSIS — J449 Chronic obstructive pulmonary disease, unspecified: Secondary | ICD-10-CM

## 2023-02-16 DIAGNOSIS — F17210 Nicotine dependence, cigarettes, uncomplicated: Secondary | ICD-10-CM | POA: Diagnosis not present

## 2023-02-16 DIAGNOSIS — K819 Cholecystitis, unspecified: Secondary | ICD-10-CM | POA: Diagnosis not present

## 2023-02-16 HISTORY — PX: CHOLECYSTECTOMY: SHX55

## 2023-02-16 LAB — CBC
HCT: 37.1 % — ABNORMAL LOW (ref 39.0–52.0)
Hemoglobin: 12.2 g/dL — ABNORMAL LOW (ref 13.0–17.0)
MCH: 30.7 pg (ref 26.0–34.0)
MCHC: 32.9 g/dL (ref 30.0–36.0)
MCV: 93.5 fL (ref 80.0–100.0)
Platelets: 274 10*3/uL (ref 150–400)
RBC: 3.97 MIL/uL — ABNORMAL LOW (ref 4.22–5.81)
RDW: 14.5 % (ref 11.5–15.5)
WBC: 11.1 10*3/uL — ABNORMAL HIGH (ref 4.0–10.5)
nRBC: 0 % (ref 0.0–0.2)

## 2023-02-16 LAB — COMPREHENSIVE METABOLIC PANEL
ALT: 16 U/L (ref 0–44)
AST: 16 U/L (ref 15–41)
Albumin: 3.2 g/dL — ABNORMAL LOW (ref 3.5–5.0)
Alkaline Phosphatase: 72 U/L (ref 38–126)
Anion gap: 12 (ref 5–15)
BUN: 13 mg/dL (ref 8–23)
CO2: 24 mmol/L (ref 22–32)
Calcium: 9.1 mg/dL (ref 8.9–10.3)
Chloride: 103 mmol/L (ref 98–111)
Creatinine, Ser: 0.85 mg/dL (ref 0.61–1.24)
GFR, Estimated: >60 mL/min (ref >60)
Glucose, Bld: 95 mg/dL (ref 70–99)
Potassium: 4 mmol/L (ref 3.5–5.1)
Sodium: 139 mmol/L (ref 135–145)
Total Bilirubin: 0.6 mg/dL (ref 0.0–1.2)
Total Protein: 6.8 g/dL (ref 6.5–8.1)

## 2023-02-16 SURGERY — LAPAROSCOPIC CHOLECYSTECTOMY
Anesthesia: General

## 2023-02-16 MED ORDER — ONDANSETRON HCL 4 MG/2ML IJ SOLN: 4.0000 mg | Freq: Once | INTRAMUSCULAR | Status: DC | PRN

## 2023-02-16 MED ORDER — ONDANSETRON HCL 4 MG/2ML IJ SOLN
INTRAMUSCULAR | Status: DC | PRN
  Administered 2023-02-16: 4 mg via INTRAVENOUS

## 2023-02-16 MED ORDER — PROPOFOL 500 MG/50ML IV EMUL
INTRAVENOUS | Status: AC
  Filled 2023-02-16: qty 100

## 2023-02-16 MED ORDER — ROCURONIUM BROMIDE 100 MG/10ML IV SOLN
INTRAVENOUS | Status: DC | PRN
  Administered 2023-02-16: 20 mg via INTRAVENOUS
  Administered 2023-02-16: 40 mg via INTRAVENOUS

## 2023-02-16 MED ORDER — OXYCODONE HCL 5 MG/5ML PO SOLN
ORAL | Status: AC
  Filled 2023-02-16: qty 5

## 2023-02-16 MED ORDER — EPHEDRINE 5 MG/ML INJ
INTRAVENOUS | Status: AC
  Filled 2023-02-16: qty 5

## 2023-02-16 MED ORDER — CEFAZOLIN SODIUM-DEXTROSE 2-3 GM-%(50ML) IV SOLR
INTRAVENOUS | Status: DC | PRN
  Administered 2023-02-16: 2 g via INTRAVENOUS

## 2023-02-16 MED ORDER — FENTANYL CITRATE PF 50 MCG/ML IJ SOSY
25.0000 ug | PREFILLED_SYRINGE | INTRAMUSCULAR | Status: DC | PRN
  Administered 2023-02-16 (×3): 50 ug via INTRAVENOUS

## 2023-02-16 MED ORDER — ACETAMINOPHEN 10 MG/ML IV SOLN
1000.0000 mg | Freq: Once | INTRAVENOUS | Status: DC | PRN
  Administered 2023-02-16: 1000 mg via INTRAVENOUS

## 2023-02-16 MED ORDER — HEMOSTATIC AGENTS (NO CHARGE) OPTIME
TOPICAL | Status: DC | PRN
  Administered 2023-02-16: 2

## 2023-02-16 MED ORDER — AMISULPRIDE (ANTIEMETIC) 5 MG/2ML IV SOLN: 10.0000 mg | Freq: Once | INTRAVENOUS | Status: DC | PRN

## 2023-02-16 MED ORDER — PROPOFOL 10 MG/ML IV BOLUS
INTRAVENOUS | Status: AC
  Filled 2023-02-16: qty 20

## 2023-02-16 MED ORDER — OXYCODONE HCL 5 MG PO TABS
5.0000 mg | ORAL_TABLET | Freq: Four times a day (QID) | ORAL | Status: DC | PRN
Start: 2023-02-16 — End: 2023-02-18
  Administered 2023-02-16: 5 mg via ORAL
  Administered 2023-02-16 – 2023-02-17 (×4): 10 mg via ORAL
  Administered 2023-02-18: 5 mg via ORAL
  Filled 2023-02-16 (×3): qty 2
  Filled 2023-02-16: qty 1
  Filled 2023-02-16 (×2): qty 2

## 2023-02-16 MED ORDER — ACETAMINOPHEN 10 MG/ML IV SOLN
INTRAVENOUS | Status: AC
  Filled 2023-02-16: qty 100

## 2023-02-16 MED ORDER — MIDAZOLAM HCL 2 MG/2ML IJ SOLN
INTRAMUSCULAR | Status: AC
  Filled 2023-02-16: qty 2

## 2023-02-16 MED ORDER — PROPOFOL 10 MG/ML IV BOLUS
INTRAVENOUS | Status: DC | PRN
  Administered 2023-02-16: 100 mg via INTRAVENOUS

## 2023-02-16 MED ORDER — KETOROLAC TROMETHAMINE 15 MG/ML IJ SOLN
INTRAMUSCULAR | Status: AC
  Filled 2023-02-16: qty 1

## 2023-02-16 MED ORDER — FENTANYL CITRATE (PF) 250 MCG/5ML IJ SOLN
INTRAMUSCULAR | Status: DC | PRN
  Administered 2023-02-16 (×2): 50 ug via INTRAVENOUS
  Administered 2023-02-16: 100 ug via INTRAVENOUS
  Administered 2023-02-16: 50 ug via INTRAVENOUS

## 2023-02-16 MED ORDER — CEFAZOLIN SODIUM 1 G IJ SOLR
INTRAMUSCULAR | Status: AC
  Filled 2023-02-16: qty 20

## 2023-02-16 MED ORDER — KETOROLAC TROMETHAMINE 30 MG/ML IJ SOLN
15.0000 mg | Freq: Once | INTRAMUSCULAR | Status: AC | PRN
  Administered 2023-02-16: 15 mg via INTRAVENOUS

## 2023-02-16 MED ORDER — MIDAZOLAM HCL 5 MG/5ML IJ SOLN
INTRAMUSCULAR | Status: DC | PRN
  Administered 2023-02-16: 2 mg via INTRAVENOUS

## 2023-02-16 MED ORDER — DEXMEDETOMIDINE HCL IN NACL 80 MCG/20ML IV SOLN
INTRAVENOUS | Status: DC | PRN
  Administered 2023-02-16 (×2): 8 ug via INTRAVENOUS

## 2023-02-16 MED ORDER — DEXAMETHASONE SODIUM PHOSPHATE 4 MG/ML IJ SOLN
INTRAMUSCULAR | Status: DC | PRN
  Administered 2023-02-16: 5 mg via INTRAVENOUS

## 2023-02-16 MED ORDER — LIDOCAINE HCL (CARDIAC) PF 100 MG/5ML IV SOSY
PREFILLED_SYRINGE | INTRAVENOUS | Status: DC | PRN
  Administered 2023-02-16: 60 mg via INTRAVENOUS

## 2023-02-16 MED ORDER — OXYCODONE HCL 5 MG PO TABS
5.0000 mg | ORAL_TABLET | Freq: Once | ORAL | Status: DC | PRN
Start: 2023-02-16 — End: 2023-02-16

## 2023-02-16 MED ORDER — BUPIVACAINE-EPINEPHRINE 0.25% -1:200000 IJ SOLN
INTRAMUSCULAR | Status: AC
  Filled 2023-02-16: qty 1

## 2023-02-16 MED ORDER — SUGAMMADEX SODIUM 200 MG/2ML IV SOLN
INTRAVENOUS | Status: DC | PRN
  Administered 2023-02-16: 200 mg via INTRAVENOUS

## 2023-02-16 MED ORDER — LACTATED RINGERS IR SOLN
Status: DC | PRN
  Administered 2023-02-16 (×3): 1000 mL

## 2023-02-16 MED ORDER — LACTATED RINGERS IV SOLN: INTRAVENOUS | Status: DC | PRN

## 2023-02-16 MED ORDER — FENTANYL CITRATE PF 50 MCG/ML IJ SOSY
PREFILLED_SYRINGE | INTRAMUSCULAR | Status: AC
  Filled 2023-02-16: qty 2

## 2023-02-16 MED ORDER — PHENYLEPHRINE 80 MCG/ML (10ML) SYRINGE FOR IV PUSH (FOR BLOOD PRESSURE SUPPORT)
PREFILLED_SYRINGE | INTRAVENOUS | Status: AC
  Filled 2023-02-16: qty 10

## 2023-02-16 MED ORDER — HYDROMORPHONE HCL 2 MG/ML IJ SOLN
INTRAMUSCULAR | Status: AC
  Filled 2023-02-16: qty 1

## 2023-02-16 MED ORDER — INDOCYANINE GREEN 25 MG IV SOLR
2.5000 mg | Freq: Once | INTRAVENOUS | Status: AC
  Administered 2023-02-16: 2.5 mg via INTRAVENOUS
  Filled 2023-02-16: qty 10

## 2023-02-16 MED ORDER — DEXAMETHASONE SODIUM PHOSPHATE 10 MG/ML IJ SOLN
INTRAMUSCULAR | Status: AC
Start: 2023-02-16 — End: ?
  Filled 2023-02-16: qty 1

## 2023-02-16 MED ORDER — FENTANYL CITRATE (PF) 250 MCG/5ML IJ SOLN
INTRAMUSCULAR | Status: AC
  Filled 2023-02-16: qty 5

## 2023-02-16 MED ORDER — HEPARIN SODIUM (PORCINE) 5000 UNIT/ML IJ SOLN
5000.0000 [IU] | Freq: Three times a day (TID) | INTRAMUSCULAR | Status: DC
  Administered 2023-02-16 – 2023-02-18 (×6): 5000 [IU] via SUBCUTANEOUS
  Filled 2023-02-16 (×6): qty 1

## 2023-02-16 MED ORDER — HYDROMORPHONE HCL 1 MG/ML IJ SOLN
INTRAMUSCULAR | Status: DC | PRN
  Administered 2023-02-16 (×3): .4 mg via INTRAVENOUS

## 2023-02-16 MED ORDER — FENTANYL CITRATE PF 50 MCG/ML IJ SOSY
PREFILLED_SYRINGE | INTRAMUSCULAR | Status: AC
  Filled 2023-02-16: qty 1

## 2023-02-16 MED ORDER — BUPIVACAINE-EPINEPHRINE 0.25% -1:200000 IJ SOLN
INTRAMUSCULAR | Status: DC | PRN
  Administered 2023-02-16: 13 mL

## 2023-02-16 MED ORDER — ONDANSETRON HCL 4 MG/2ML IJ SOLN
INTRAMUSCULAR | Status: AC
Start: 2023-02-16 — End: ?
  Filled 2023-02-16: qty 2

## 2023-02-16 SURGICAL SUPPLY — 40 items
APPLIER CLIP 5 13 M/L LIGAMAX5 (MISCELLANEOUS) ×1
APPLIER CLIP ROT 10 11.4 M/L (STAPLE)
BAG COUNTER SPONGE SURGICOUNT (BAG) IMPLANT
BENZOIN TINCTURE PRP APPL 2/3 (GAUZE/BANDAGES/DRESSINGS) IMPLANT
CABLE HIGH FREQUENCY MONO STRZ (ELECTRODE) ×1 IMPLANT
CHLORAPREP W/TINT 26 (MISCELLANEOUS) ×1 IMPLANT
CLIP APPLIE 5 13 M/L LIGAMAX5 (MISCELLANEOUS) ×1 IMPLANT
CLIP APPLIE ROT 10 11.4 M/L (STAPLE) IMPLANT
COVER MAYO STAND XLG (MISCELLANEOUS) ×1 IMPLANT
COVER SURGICAL LIGHT HANDLE (MISCELLANEOUS) ×1 IMPLANT
DERMABOND ADVANCED .7 DNX12 (GAUZE/BANDAGES/DRESSINGS) IMPLANT
DRAPE C-ARM 42X120 X-RAY (DRAPES) IMPLANT
ELECT REM PT RETURN 15FT ADLT (MISCELLANEOUS) ×1 IMPLANT
ENDOLOOP SUT PDS II 0 18 (SUTURE) IMPLANT
GLOVE BIO SURGEON STRL SZ7 (GLOVE) ×1 IMPLANT
GLOVE BIOGEL PI IND STRL 7.5 (GLOVE) ×1 IMPLANT
GOWN STRL REUS W/ TWL LRG LVL3 (GOWN DISPOSABLE) ×1 IMPLANT
GRASPER SUT TROCAR 14GX15 (MISCELLANEOUS) IMPLANT
HEMOSTAT SNOW SURGICEL 2X4 (HEMOSTASIS) IMPLANT
IRRIG SUCT STRYKERFLOW 2 WTIP (MISCELLANEOUS) ×1
IRRIGATION SUCT STRKRFLW 2 WTP (MISCELLANEOUS) ×1 IMPLANT
KIT BASIN OR (CUSTOM PROCEDURE TRAY) ×1 IMPLANT
KIT TURNOVER KIT A (KITS) IMPLANT
PENCIL SMOKE EVACUATOR (MISCELLANEOUS) IMPLANT
POUCH RETRIEVAL ECOSAC 10 (ENDOMECHANICALS) ×1 IMPLANT
PROTECTOR NERVE ULNAR (MISCELLANEOUS) IMPLANT
SCISSORS LAP 5X35 DISP (ENDOMECHANICALS) ×1 IMPLANT
SET CHOLANGIOGRAPH MIX (MISCELLANEOUS) IMPLANT
SET TUBE SMOKE EVAC HIGH FLOW (TUBING) ×1 IMPLANT
SLEEVE Z-THREAD 5X100MM (TROCAR) ×2 IMPLANT
SPIKE FLUID TRANSFER (MISCELLANEOUS) ×1 IMPLANT
STRIP CLOSURE SKIN 1/2X4 (GAUZE/BANDAGES/DRESSINGS) IMPLANT
SUT MNCRL AB 4-0 PS2 18 (SUTURE) ×1 IMPLANT
SUT VICRYL 0 TIES 12 18 (SUTURE) IMPLANT
SUT VICRYL 0 UR6 27IN ABS (SUTURE) IMPLANT
TOWEL OR 17X26 10 PK STRL BLUE (TOWEL DISPOSABLE) ×1 IMPLANT
TRAY LAPAROSCOPIC (CUSTOM PROCEDURE TRAY) ×1 IMPLANT
TROCAR 11X100 Z THREAD (TROCAR) IMPLANT
TROCAR BALLN 12MMX100 BLUNT (TROCAR) ×1 IMPLANT
TROCAR Z-THREAD OPTICAL 5X100M (TROCAR) ×1 IMPLANT

## 2023-02-16 NOTE — Plan of Care (Signed)
  Problem: Coping: Goal: Level of anxiety will decrease Outcome: Progressing   Problem: Pain Management: Goal: General experience of comfort will improve Outcome: Progressing   Problem: Safety: Goal: Ability to remain free from injury will improve Outcome: Progressing

## 2023-02-16 NOTE — Op Note (Signed)
Preoperative diagnosis: Acute cholecystitis status post percutaneous cholecystostomy Postoperative diagnosis: Same as above Procedure: Laparoscopic cholecystectomy Surgeon: Dr. Harden Mo Assistant: Trixie Deis, PA-C Anesthesia: General Estimated blood loss: 100 cc Complications: None Drains: none Specimens:  gallbladder and stones to pathology Disposition recovery stable condition   Indications: 2 yom with perc chole since December.  He had question of ampullary mass and was readmitted with cholecystitis. He had not had an eus as recommended.  He got this while here and was negative. Had a tube change and I think just still has cholecystitis so we discussed going to OR.    Procedure: After informed consent was obtained the patient was taken to the operating room.  He was given antibiotics.  SCDs were in place.  He was then placed under general anesthesia without complication.  He was prepped and draped in standard sterile surgical fashion.  Surgical timeout was then performed.   I made an incision below the umbilicus.  I incised the fascia and entered the peritoneum bluntly. I placed a 0 Vicryl pursestring suture through the fascia.  I then inserted a Hassan trocar and insufflated the abdomen to 15 mmHg pressure.  I then placed 3 additional 5 mm trocars in the upper abdomen.  Later in the case I sized up the epigastric port to an 11 mm port. I removed the perc chole tube.  The gallbladder was able to be grasped then.  It was very difficult due to the fact that it was thick.  I attempted to aspirate it but he really did not have a lot of fluid in his gallbladder.  I then retracted it cephalad.  We retracted it laterally as well.  The duodenum was adherent to the gallbladder I took this down with a combination of blunt and sharp dissection.  The gallbladder was very difficult to manage throughout the entire operation due to the chronicity of the infection.  I was then able to begin dissecting  the triangle.  I did use the ICG dye to identify my bile duct and stay away from it.  I placed some clips on a vessel going to the gallbladder.  I thought that I was able to get around the cystic duct and cystic artery and behind and on the hepatocystic plate.  I then elected to take the gallbladder dome down for these 2 to meet.  This was done without a whole lot of difficulty although the gallbladder was very thick.  I did spill stones and purulence during this portion of the operation.  Once I got to the bottom I used the ICG dye again and realized that the gallbladder really sat right on the common bile duct.  I elected to divide the gallbladder with a very small cuff of it on top of the bile duct.  I then evacuated the stones that were in the proximal gallbladder so there were no more stones present.  I then used an Endoloop to close this so I reconstituted a very small portion of gallbladder right on the cystic duct.  I then placed the gallbladder and some of the stones in a retrieval bag and this eventually was removed.  I evacuated all the stones I could find that it spilled.  I irrigated copiously.  I then obtain hemostasis and placed some Surgicel snow in the liver bed.  I then remove the epigastric trocar.  I sutured this closed with several 0 Vicryl sutures and the suture passer device.  The umbilical trocar was  then removed and I tied my pursestring down.  I had an additional 0 Vicryl suture to completely obliterate that defect with a suture passer device.  I then remove the remaining trocars and desufflated the abdomen.  These were closed with 4-0 Monocryl and glue.  He tolerated this well was extubated and transferred recovery stable.

## 2023-02-16 NOTE — Progress Notes (Signed)
PROGRESS NOTE Aaron Stevens  HQI:[id number] DOB: Jan 20, 1956 DOA: 02/10/2023 PCP: Maretta Bees, PA  Brief Narrative/Hospital Course: 68 y.o. male with medical history significant of atherosclerosis, emphysema, history of pneumonia, right eye cataract who was admitted last month from 01/09/2023 until 01/14/2023 due to acute cholecystitis with cholelithiasis undergoing a percutaneous drain placement on 01/12/2023 with GI recommending that the patient will go EUS in the future but he returned 02/10/23 with complaints of right upper quadrant pain associated with increased output from his percutaneous cholecystostomy drain and mild erythema at insertion site and his wife unable to flush the tube. In the ED hemodynamically stable afebrile. Labs with a stable CMP, mild leukocytosis 11 normal lactic acid. IR consulted to address drain.  GI and surgery has been consulted as well.  He will need EUS for evaluation of double duct sign at some point before cholecystectomy. 1/13: Cholecystostomy tube exchange by IR 1/14: CT [id number] in appropriate position with persistent gallbladder wall thickening and pericholecystic fluid. Persistent mild intrahepatic and extrahepatic biliary dilatation 1/15: EUS> no significant abnormality in the ampulla, CBD 8 mm few benign lymph nodes visualized cyst was found in the left lobe of the liver 10 x 10, finding of chronic pancreatitis noticed, and GI advised to proceed with cholecystectomy.  Subjective: Seen and examined this morning Wife at the bedside complains of some pain and spasm in the right upper quadrant Overnight afebrile labs stable N.p.o. for surgery today  Assessment and Plan: Principal Problem:   Cholecystitis Active Problems:   Emphysema of lung (HCC)   Tobacco use   Normocytic anemia   Cholelithiasis  Acute cholecystitis s/p perc cholecystostomy on 01/12/23> exchanged 1/13 Ampullary mass Ruled out bu EUS: CT showed drain in position  but not sure functioning or not.GI and surgery consulted and S/P cholecystomy tube exchanged by IR 1/13. On 1/15 s/p EUS> no significant abnormality in the ampulla, CBD 8 mm few benign lymph nodes visualized cyst was found in the left lobe of the liver 10 x 10, finding of chronic pancreatitis noticed, and GI advised to proceed with cholecystectomy.for cholecystectomy today.  Leukocytosis is resolving, hemodynamically stable. Continue Zosyn and plan for CCS  Chronic pancreatitis per EUS: Will need outpatient GI follow-up    Emphysema of lung: Resp status stable, continue Dulera  Normocytic anemia: In the setting of chronic illness infection monitor hemoglobin intermittently  DVT prophylaxis: heparin injection 5,000 Units Start: 02/15/23 1400 SCDs Start: 02/10/23 1741 Code Status:   Code Status: Full Code Family Communication: plan of care discussed with patient at bedside. Patient status is: Remains hospitalized because of severity of illness Level of care: Med-Surg   Dispo: The patient is from: HOME W/ wife            Anticipated disposition: Anticipating discharge tomorrow.    Objective: Vitals last 24 hrs: Vitals:   02/15/23 1935 02/15/23 2021 02/16/23 0536 02/16/23 0727  BP:  113/67 125/64   Pulse:  66 80   Resp:  18 18   Temp:  98.8 F (37.1 C) 98.7 F (37.1 C)   TempSrc:  Oral Oral   SpO2: 95% 92% 92% 94%  Weight:      Height:       Weight change:   Physical Examination: General exam: alert awake, oriented at baseline, older than stated age HEENT:Oral mucosa moist, Ear/Nose WNL grossly Respiratory system: Bilaterally clear BS,no use of accessory muscle Cardiovascular system: S1 & S2 +, No JVD. Gastrointestinal system: Abdomen soft, ruq drain+  Nervous System: Alert, awake, moving all extremities,and following commands. Extremities: LE edema neg,distal peripheral pulses palpable and warm.  Skin: No rashes,no icterus. MSK: Normal muscle bulk,tone, power    Medications reviewed:  Scheduled Meds:  gabapentin  300 mg Oral QHS   heparin injection (subcutaneous)  5,000 Units Subcutaneous Q8H   indocyanine green  2.5 mg Intravenous Once   mometasone-formoterol  2 puff Inhalation BID   sodium chloride flush  5 mL Intracatheter Q8H  Continuous Infusions:  piperacillin-tazobactam (ZOSYN)  IV 3.375 g (02/16/23 0514)    Diet Order             Diet NPO time specified Except for: Sips with Meds  Diet effective midnight                  Intake/Output Summary (Last 24 hours) at 02/16/2023 0806 Last data filed at 02/16/2023 0600 Gross per 24 hour  Intake 944.35 ml  Output 100 ml  Net 844.35 ml   Net IO Since Admission: 1,570.14 mL [02/16/23 0806]  Wt Readings from Last 3 Encounters:  02/14/23 54.9 kg  01/26/23 54.9 kg  01/09/23 55.7 kg     Unresulted Labs (From admission, onward)     Start     Ordered   02/16/23 0500  CBC  Daily,   R      02/15/23 0835   02/16/23 0500  Comprehensive metabolic panel  Daily,   R      02/15/23 0835          Data Reviewed: I have personally reviewed following labs and imaging studies CBC: Recent Labs  Lab 02/10/23 1341 02/11/23 0527 02/12/23 0439 02/13/23 0446 02/14/23 0453 02/15/23 0906 02/16/23 0508  WBC 11.0*   < > 10.9* 11.5* 15.6* 11.9* 11.1*  NEUTROABS 6.6  --   --   --   --   --   --   HGB 12.5*   < > 12.2* 12.4* 12.6* 11.7* 12.2*  HCT 39.0   < > 35.4* 38.9* 39.4 35.5* 37.1*  MCV 95.6   < > 91.7 94.9 94.9 92.7 93.5  PLT 262   < > 217 241 258 243 274   < > = values in this interval not displayed.   Basic Metabolic Panel:  Recent Labs  Lab 02/11/23 0527 02/12/23 0439 02/13/23 0446 02/14/23 0453 02/16/23 0508  NA 134* 136 134* 135 139  K 4.7 4.1 4.4 4.2 4.0  CL 101 102 101 101 103  CO2 26 26 27 23 24   GLUCOSE 95 94 97 91 95  BUN 15 13 15 16 13   CREATININE 0.93 1.00 1.01 1.08 0.85  CALCIUM 8.8* 8.7* 8.9 9.0 9.1   GFR: Estimated Creatinine Clearance: 64.6 mL/min (by C-G  formula based on SCr of 0.85 mg/dL). Liver Function Tests:  Recent Labs  Lab 02/11/23 0527 02/12/23 0439 02/13/23 0446 02/14/23 0453 02/16/23 0508  AST 28 23 23 18 16   ALT 30 25 25 20 16   ALKPHOS 84 78 76 76 72  BILITOT 0.9 0.9 0.7 1.0 0.6  PROT 6.2* 6.0* 6.5 6.5 6.8  ALBUMIN 3.4* 3.2* 3.2* 3.2* 3.2*   Recent Labs  Lab 02/10/23 1349 02/10/23 1721  LATICACIDVEN 0.5 0.8   Recent Results (from the past 240 hours)  Surgical pcr screen     Status: None   Collection Time: 02/15/23  8:50 PM   Specimen: Nasal Mucosa; Nasal Swab  Result Value Ref Range Status   MRSA, PCR NEGATIVE NEGATIVE  Final   Staphylococcus aureus NEGATIVE NEGATIVE Final    Comment: (NOTE) The Xpert SA Assay (FDA approved for NASAL specimens in patients 61 years of age and older), is one component of a comprehensive surveillance program. It is not intended to diagnose infection nor to guide or monitor treatment. Performed at [facility], [address], [place], [place] [address]     Antimicrobials/Microbiology: Anti-infectives (From admission, onward)    Start     Dose/Rate Route Frequency Ordered Stop   02/11/23 0900  piperacillin-tazobactam (ZOSYN) IVPB 3.375 g        3.375 g 12.5 mL/hr over 240 Minutes Intravenous Every 8 hours 02/11/23 0815     02/10/23 1730  piperacillin-tazobactam (ZOSYN) IVPB 3.375 g        3.375 g 12.5 mL/hr over 240 Minutes Intravenous  Once 02/10/23 1716 02/10/23 2130         Component Value Date/Time   SDES  01/12/2023 1359    BILE DRAIN Performed at [facility] [place], [address], [place], [place] [address]    SPECREQUEST  01/12/2023 1359    NONE Performed at [facility], [address], [place], [place] [address]    CULT  01/12/2023 1359    No growth aerobically or anaerobically. Performed at [facility], [address] [place], [place] [address]    REPTSTATUS 01/17/2023 FINAL 01/12/2023 1359      Radiology Studies: No results found.  LOS: 6 days   Total time spent in review of labs and imaging, patient evaluation, formulation of plan, documentation and communication with family: 35 minutes  Lanae Boast, MD [facility]  02/16/2023, 8:06 AM

## 2023-02-16 NOTE — Anesthesia Postprocedure Evaluation (Signed)
Anesthesia Post Note  Patient: Aaron Stevens  Procedure(s) Performed: LAPAROSCOPIC CHOLECYSTECTOMY WITH ICG     Patient location during evaluation: PACU Anesthesia Type: General Level of consciousness: awake Pain management: pain level controlled Vital Signs Assessment: post-procedure vital signs reviewed and stable Respiratory status: spontaneous breathing, nonlabored ventilation and respiratory function stable Cardiovascular status: blood pressure returned to baseline and stable Postop Assessment: no apparent nausea or vomiting Anesthetic complications: no   No notable events documented.  Last Vitals:  Vitals:   02/16/23 1245 02/16/23 1306  BP: (!) 144/68 137/71  Pulse: 73 77  Resp: 10 15  Temp: (!) 36 C 36.6 C  SpO2: 100% 99%    Last Pain:  Vitals:   02/16/23 1306  TempSrc: Oral  PainSc:                  Amahd Morino P Merrit Waugh

## 2023-02-16 NOTE — Discharge Instructions (Signed)

## 2023-02-16 NOTE — Anesthesia Procedure Notes (Signed)
Procedure Name: Intubation Date/Time: 02/16/2023 10:11 AM  Performed by: Deri Fuelling, CRNAPre-anesthesia Checklist: Patient identified, Emergency Drugs available, Suction available and Patient being monitored Patient Re-evaluated:Patient Re-evaluated prior to induction Oxygen Delivery Method: Circle system utilized Preoxygenation: Pre-oxygenation with 100% oxygen Induction Type: IV induction Ventilation: Mask ventilation without difficulty Laryngoscope Size: Mac and 4 Grade View: Grade I Tube type: Oral Tube size: 7.5 mm Number of attempts: 1 Airway Equipment and Method: Stylet and Oral airway Placement Confirmation: ETT inserted through vocal cords under direct vision, positive ETCO2 and breath sounds checked- equal and bilateral Secured at: 23 cm Tube secured with: Tape Dental Injury: Teeth and Oropharynx as per pre-operative assessment

## 2023-02-16 NOTE — Progress Notes (Signed)
*   Day of Surgery *   Subjective/Chief Complaint: Unchanged, ready for surgery   Objective: Vital signs in last 24 hours: Temp:  [98.1 F (36.7 C)-98.8 F (37.1 C)] 98.7 F (37.1 C) (01/17 0828) Pulse Rate:  [61-80] 61 (01/17 0828) Resp:  [18] 18 (01/17 0828) BP: (113-131)/(58-67) 131/62 (01/17 0828) SpO2:  [92 %-95 %] 93 % (01/17 0828) Weight:  [54.9 kg] 54.9 kg (01/17 0823) Last BM Date : 02/12/23  Intake/Output from previous day: 01/16 0701 - 01/17 0700 In: 944.4 [P.O.:780; I.V.:5; IV Piggyback:154.4] Out: 100 [Drains:100] Intake/Output this shift: No intake/output data recorded.  Ab mildly tender, drain in place  Lab Results:  Recent Labs    02/15/23 0906 02/16/23 0508  WBC 11.9* 11.1*  HGB 11.7* 12.2*  HCT 35.5* 37.1*  PLT 243 274   BMET Recent Labs    02/14/23 0453 02/16/23 0508  NA 135 139  K 4.2 4.0  CL 101 103  CO2 23 24  GLUCOSE 91 95  BUN 16 13  CREATININE 1.08 0.85  CALCIUM 9.0 9.1   PT/INR No results for input(s): "LABPROT", "INR" in the last 72 hours. ABG No results for input(s): "PHART", "HCO3" in the last 72 hours.  Invalid input(s): "PCO2", "PO2"  Studies/Results: No results found.  Anti-infectives: Anti-infectives (From admission, onward)    Start     Dose/Rate Route Frequency Ordered Stop   02/11/23 0900  [MAR Hold]  piperacillin-tazobactam (ZOSYN) IVPB 3.375 g        (MAR Hold since Fri 02/16/2023 at 0823.Hold Reason: Transfer to a Procedural area)   3.375 g 12.5 mL/hr over 240 Minutes Intravenous Every 8 hours 02/11/23 0815     02/10/23 1730  piperacillin-tazobactam (ZOSYN) IVPB 3.375 g        3.375 g 12.5 mL/hr over 240 Minutes Intravenous  Once 02/10/23 1716 02/10/23 2130       Assessment/Plan: Cholecystitis with perc chole -OR today as discussed  Emelia Loron 02/16/2023

## 2023-02-16 NOTE — Anesthesia Preprocedure Evaluation (Addendum)
Anesthesia Evaluation  Patient identified by MRN, date of birth, ID band Patient awake    Reviewed: Allergy & Precautions, NPO status , Patient's Chart, lab work & pertinent test results  Airway Mallampati: II  TM Distance: >3 FB Neck ROM: Full    Dental  (+) Edentulous Upper, Missing   Pulmonary COPD,  COPD inhaler, Current Smoker and Patient abstained from smoking.   Pulmonary exam normal        Cardiovascular negative cardio ROS Normal cardiovascular exam     Neuro/Psych negative neurological ROS  negative psych ROS   GI/Hepatic negative GI ROS, Neg liver ROS,,,  Endo/Other  negative endocrine ROS    Renal/GU negative Renal ROS     Musculoskeletal negative musculoskeletal ROS (+)    Abdominal   Peds  Hematology  (+) Blood dyscrasia, anemia   Anesthesia Other Findings CHOLECYSTITIS  Reproductive/Obstetrics                             Anesthesia Physical Anesthesia Plan  ASA: 3  Anesthesia Plan: General   Post-op Pain Management:    Induction: Intravenous  PONV Risk Score and Plan: 1 and Ondansetron, Dexamethasone, Midazolam and Treatment may vary due to age or medical condition  Airway Management Planned: Oral ETT  Additional Equipment:   Intra-op Plan:   Post-operative Plan: Extubation in OR  Informed Consent: I have reviewed the patients History and Physical, chart, labs and discussed the procedure including the risks, benefits and alternatives for the proposed anesthesia with the patient or authorized representative who has indicated his/her understanding and acceptance.     Dental advisory given  Plan Discussed with: CRNA  Anesthesia Plan Comments:        Anesthesia Quick Evaluation

## 2023-02-16 NOTE — Transfer of Care (Signed)
Immediate Anesthesia Transfer of Care Note  Patient: [facility]ers Sur  Procedure(s) Performed: LAPAROSCOPIC CHOLECYSTECTOMY WITH ICG  Patient Location: PACU  Anesthesia Type:General  Level of Consciousness: drowsy and patient cooperative  Airway & Oxygen Therapy: Patient Spontanous Breathing and Patient connected to face mask oxygen  Post-op Assessment: Report given to RN and Post -op Vital signs reviewed and stable  Post vital signs: Reviewed and stable  Last Vitals:  Vitals Value Taken Time  BP 129/80 02/16/23 1152  Temp 36 C 02/16/23 1152  Pulse 85 02/16/23 1156  Resp 13 02/16/23 1156  SpO2 100 % 02/16/23 1156  Vitals shown include unfiled device data.  Last Pain:  Vitals:   02/16/23 1152  TempSrc:   PainSc: Asleep      Patients Stated Pain Goal: 2 (02/16/23 0513)  Complications: No notable events documented.

## 2023-02-17 ENCOUNTER — Encounter (HOSPITAL_COMMUNITY): Payer: Self-pay | Admitting: General Surgery

## 2023-02-17 DIAGNOSIS — K819 Cholecystitis, unspecified: Secondary | ICD-10-CM | POA: Diagnosis not present

## 2023-02-17 LAB — COMPREHENSIVE METABOLIC PANEL
ALT: 20 U/L (ref 0–44)
AST: 30 U/L (ref 15–41)
Albumin: 2.9 g/dL — ABNORMAL LOW (ref 3.5–5.0)
Alkaline Phosphatase: 70 U/L (ref 38–126)
Anion gap: 6 (ref 5–15)
BUN: 18 mg/dL (ref 8–23)
CO2: 29 mmol/L (ref 22–32)
Calcium: 8.9 mg/dL (ref 8.9–10.3)
Chloride: 101 mmol/L (ref 98–111)
Creatinine, Ser: 1.02 mg/dL (ref 0.61–1.24)
GFR, Estimated: >60 mL/min (ref >60)
Glucose, Bld: 124 mg/dL — ABNORMAL HIGH (ref 70–99)
Potassium: 4.3 mmol/L (ref 3.5–5.1)
Sodium: 136 mmol/L (ref 135–145)
Total Bilirubin: 0.7 mg/dL (ref 0.0–1.2)
Total Protein: 6.6 g/dL (ref 6.5–8.1)

## 2023-02-17 LAB — CBC
HCT: 35.8 % — ABNORMAL LOW (ref 39.0–52.0)
Hemoglobin: 11.9 g/dL — ABNORMAL LOW (ref 13.0–17.0)
MCH: 31.1 pg (ref 26.0–34.0)
MCHC: 33.2 g/dL (ref 30.0–36.0)
MCV: 93.5 fL (ref 80.0–100.0)
Platelets: 359 10*3/uL (ref 150–400)
RBC: 3.83 MIL/uL — ABNORMAL LOW (ref 4.22–5.81)
RDW: 14.4 % (ref 11.5–15.5)
WBC: 15 10*3/uL — ABNORMAL HIGH (ref 4.0–10.5)
nRBC: 0 % (ref 0.0–0.2)

## 2023-02-17 NOTE — Progress Notes (Signed)
PROGRESS NOTE    Aaron Stevens  WUJ:[id number] DOB: 1955/03/13 DOA: 02/10/2023 PCP: Maretta Bees, PA   Brief Narrative:  68 y.o. male with medical history significant of atherosclerosis, emphysema, history of pneumonia, right eye cataract who was admitted last month from 01/09/2023 until 01/14/2023 due to acute cholecystitis with cholelithiasis undergoing a percutaneous drain placement on 01/12/2023 with GI recommending that the patient will go EUS in the future but he returned 02/10/23 with complaints of right upper quadrant pain associated with increased output from his percutaneous cholecystostomy drain and mild erythema at insertion site and his wife unable to flush the tube. In the ED hemodynamically stable afebrile. Labs with a stable CMP, mild leukocytosis 11 normal lactic acid. IR consulted to address drain.  GI and surgery has been consulted as well.  He will need EUS for evaluation of double duct sign at some point before cholecystectomy. 1/13: Cholecystostomy tube exchange by IR 1/14: CT NFA:[id number] in appropriate position with persistent gallbladder wall thickening and pericholecystic fluid. Persistent mild intrahepatic and extrahepatic biliary dilatation 1/15: EUS> no significant abnormality in the ampulla, CBD 8 mm few benign lymph nodes visualized cyst was found in the left lobe of the liver 10 x 10, finding of chronic pancreatitis noticed, and GI advised to proceed with cholecystectomy.  Underwent cholecystectomy on 1/17  Assessment & Plan:  Acute cholecystitis s/p perc cholecystostomy on 01/12/23> exchanged 1/13 Ampullary mass Ruled out bu EUS: -CT showed drain in position but not sure functioning or not. -GI and surgery consulted and S/P cholecystomy tube exchanged by IR 1/13.  -On 1/15 s/p EUS> no significant abnormality in the ampulla, CBD 8 mm few benign lymph nodes visualized. cyst was found in the left lobe of the liver 10 x 10, finding of chronic  pancreatitis noticed, and GI advised to proceed with cholecystectomy. -S/p cholecystectomy on 1/17.  Remained afebrile, WBC trended up from 11-15. -Continue as needed pain medication.  Advance diet to soft -Continue Zosyn -General Surgery recommended DC home if he able to tolerate diet.   Chronic pancreatitis per EUS: -Will need outpatient GI follow-up    Emphysema of lung: -Resp status stable, continue Dulera   Normocytic anemia: -In the setting of chronic illness infection monitor hemoglobin intermittently   DVT prophylaxis: Heparin Code Status: Full code Family Communication: Patient's wife present at bedside.  Plan of care discussed with patient in length and he verbalized understanding and agreed with it. Disposition Plan: Likely home tomorrow  Consultants:  GI General Surgery  Procedures:  EUS Colostomy tube exchange Cholecystectomy  Antimicrobials:  Zosyn  Status is: Inpatient      Subjective: Patient seen and examined.  Wife at the bedside.  Complaining of severe abdominal pain requesting pain medicine.  Remained afebrile.  No nausea, vomiting.  Not comfortable going home today due to persistent pain  Objective: Vitals:   02/16/23 2059 02/17/23 0117 02/17/23 0537 02/17/23 1017  BP: (!) 109/54 122/62 119/72 120/65  Pulse: 66 71 80 73  Resp: 17 16 19 16   Temp: 98.1 F (36.7 C) 98.7 F (37.1 C) 98.6 F (37 C) 98.9 F (37.2 C)  TempSrc: Oral Oral Oral Oral  SpO2: 97% 95% 93% 94%  Weight:      Height:        Intake/Output Summary (Last 24 hours) at 02/17/2023 1321 Last data filed at 02/17/2023 1019 Gross per 24 hour  Intake 590.32 ml  Output 300 ml  Net 290.32 ml   [employer]  02/10/23 1305 02/14/23 1014 02/16/23 0823  Weight: 54.9 kg 54.9 kg 54.9 kg    Examination:  General exam: Appears calm and comfortable, on room air, communicating well, spouse at bedside Respiratory system: Clear to auscultation. Respiratory effort  normal. Cardiovascular system: S1 & S2 heard, RRR. No JVD, murmurs, rubs, gallops or clicks. No pedal edema. Gastrointestinal system: Right upper quadrant tenderness positive, no guarding, no rigidity, no hepatosplenomegaly.  Bowel sounds positive Central nervous system: Alert and oriented. No focal neurological deficits. Extremities: Symmetric 5 x 5 power. Skin: No rashes, lesions or ulcers Psychiatry: Judgement and insight appear normal. Mood & affect appropriate.    Data Reviewed: I have personally reviewed following labs and imaging studies  CBC: Recent Labs  Lab 02/10/23 1341 02/11/23 0527 02/13/23 0446 02/14/23 0453 02/15/23 0906 02/16/23 0508 02/17/23 0532  WBC 11.0*   < > 11.5* 15.6* 11.9* 11.1* 15.0*  NEUTROABS 6.6  --   --   --   --   --   --   HGB 12.5*   < > 12.4* 12.6* 11.7* 12.2* 11.9*  HCT 39.0   < > 38.9* 39.4 35.5* 37.1* 35.8*  MCV 95.6   < > 94.9 94.9 92.7 93.5 93.5  PLT 262   < > 241 258 243 274 359   < > = values in this interval not displayed.   Basic Metabolic Panel: Recent Labs  Lab 02/12/23 0439 02/13/23 0446 02/14/23 0453 02/16/23 0508 02/17/23 0532  NA 136 134* 135 139 136  K 4.1 4.4 4.2 4.0 4.3  CL 102 101 101 103 101  CO2 26 27 23 24 29   GLUCOSE 94 97 91 95 124*  BUN 13 15 16 13 18   CREATININE 1.00 1.01 1.08 0.85 1.02  CALCIUM 8.7* 8.9 9.0 9.1 8.9   GFR: Estimated Creatinine Clearance: 53.8 mL/min (by C-G formula based on SCr of 1.02 mg/dL). Liver Function Tests: Recent Labs  Lab 02/12/23 0439 02/13/23 0446 02/14/23 0453 02/16/23 0508 02/17/23 0532  AST 23 23 18 16 30   ALT 25 25 20 16 20   ALKPHOS 78 76 76 72 70  BILITOT 0.9 0.7 1.0 0.6 0.7  PROT 6.0* 6.5 6.5 6.8 6.6  ALBUMIN 3.2* 3.2* 3.2* 3.2* 2.9*   No results for input(s): "LIPASE", "AMYLASE" in the last 168 hours. No results for input(s): "AMMONIA" in the last 168 hours. Coagulation Profile: No results for input(s): "INR", "PROTIME" in the last 168 hours. Cardiac  Enzymes: No results for input(s): "CKTOTAL", "CKMB", "CKMBINDEX", "TROPONINI" in the last 168 hours. BNP (last 3 results) No results for input(s): "PROBNP" in the last 8760 hours. HbA1C: No results for input(s): "HGBA1C" in the last 72 hours. CBG: No results for input(s): "GLUCAP" in the last 168 hours. Lipid Profile: No results for input(s): "CHOL", "HDL", "LDLCALC", "TRIG", "CHOLHDL", "LDLDIRECT" in the last 72 hours. Thyroid Function Tests: No results for input(s): "TSH", "T4TOTAL", "FREET4", "T3FREE", "THYROIDAB" in the last 72 hours. Anemia Panel: No results for input(s): "VITAMINB12", "FOLATE", "FERRITIN", "TIBC", "IRON", "RETICCTPCT" in the last 72 hours. Sepsis Labs: Recent Labs  Lab 02/10/23 1349 02/10/23 1721  LATICACIDVEN 0.5 0.8    Recent Results (from the past 240 hours)  Surgical pcr screen     Status: None   Collection Time: 02/15/23  8:50 PM   Specimen: Nasal Mucosa; Nasal Swab  Result Value Ref Range Status   MRSA, PCR NEGATIVE NEGATIVE Final   Staphylococcus aureus NEGATIVE NEGATIVE Final    Comment: (NOTE) The Xpert  SA Assay (FDA approved for NASAL specimens in patients 48 years of age and older), is one component of a comprehensive surveillance program. It is not intended to diagnose infection nor to guide or monitor treatment. Performed at [facility], [address] [place], [place] [address]       Radiology Studies: No results found.  Scheduled Meds:  gabapentin  300 mg Oral QHS   heparin injection (subcutaneous)  5,000 Units Subcutaneous Q8H   mometasone-formoterol  2 puff Inhalation BID   sodium chloride flush  5 mL Intracatheter Q8H   Continuous Infusions:  piperacillin-tazobactam (ZOSYN)  IV 3.375 g (02/17/23 0514)     LOS: 7 days   Time spent: 35 minutes   Eliah Marquard Estill Cotta, MD [facility]  If 7PM-7AM, please contact night-coverage [email or web address] 02/17/2023, 1:21 PM

## 2023-02-17 NOTE — Progress Notes (Signed)
1 Day Post-Op   Subjective/Chief Complaint: Tol clears well   Objective: Vital signs in last 24 hours: Temp:  [96.8 F (36 C)-98.7 F (37.1 C)] 98.6 F (37 C) (01/18 0537) Pulse Rate:  [66-88] 80 (01/18 0537) Resp:  [10-19] 19 (01/18 0537) BP: (109-147)/(54-85) 119/72 (01/18 0537) SpO2:  [93 %-100 %] 93 % (01/18 0537) Last BM Date : 02/16/23  Intake/Output from previous day: 01/17 0701 - 01/18 0700 In: 1420.3 [P.O.:330; I.V.:900; IV Piggyback:190.3] Out: 320 [Urine:300; Blood:20] Intake/Output this shift: No intake/output data recorded.  General appearance: alert and cooperative GI: soft, non-tender; bowel sounds normal; no masses,  no organomegaly and inc c/d/i  Lab Results:  Recent Labs    02/16/23 0508 02/17/23 0532  WBC 11.1* 15.0*  HGB 12.2* 11.9*  HCT 37.1* 35.8*  PLT 274 359   BMET Recent Labs    02/16/23 0508 02/17/23 0532  NA 139 136  K 4.0 4.3  CL 103 101  CO2 24 29  GLUCOSE 95 124*  BUN 13 18  CREATININE 0.85 1.02  CALCIUM 9.1 8.9   PT/INR No results for input(s): "LABPROT", "INR" in the last 72 hours. ABG No results for input(s): "PHART", "HCO3" in the last 72 hours.  Invalid input(s): "PCO2", "PO2"  Studies/Results: No results found.  Anti-infectives: Anti-infectives (From admission, onward)    Start     Dose/Rate Route Frequency Ordered Stop   02/11/23 0900  piperacillin-tazobactam (ZOSYN) IVPB 3.375 g        3.375 g 12.5 mL/hr over 240 Minutes Intravenous Every 8 hours 02/11/23 0815     02/10/23 1730  piperacillin-tazobactam (ZOSYN) IVPB 3.375 g        3.375 g 12.5 mL/hr over 240 Minutes Intravenous  Once 02/10/23 1716 02/10/23 2130       Assessment/Plan: s/p Procedure(s): LAPAROSCOPIC CHOLECYSTECTOMY WITH ICG (N/A) Advance diet as tol Ok for home today if tol soft  LOS: 7 days    Axel Filler 02/17/2023

## 2023-02-17 NOTE — Progress Notes (Signed)
Mobility Specialist - Progress Note   02/17/23 0938  Mobility  Activity Ambulated independently in hallway  Level of Assistance Independent  Assistive Device None  Distance Ambulated (ft) 500 ft  Activity Response Tolerated well  Mobility Referral Yes  Mobility visit 1 Mobility  Mobility Specialist Start Time (ACUTE ONLY) 0912  Mobility Specialist Stop Time (ACUTE ONLY) 0930  Mobility Specialist Time Calculation (min) (ACUTE ONLY) 18 min   Pt received in bed and agreeable to mobility. No complaints during session. Pt to bed after session with all needs met.    [facility]

## 2023-02-18 ENCOUNTER — Encounter (HOSPITAL_COMMUNITY): Payer: Self-pay | Admitting: Gastroenterology

## 2023-02-18 DIAGNOSIS — K819 Cholecystitis, unspecified: Secondary | ICD-10-CM | POA: Diagnosis not present

## 2023-02-18 LAB — COMPREHENSIVE METABOLIC PANEL
ALT: 16 U/L (ref 0–44)
AST: 22 U/L (ref 15–41)
Albumin: 3 g/dL — ABNORMAL LOW (ref 3.5–5.0)
Alkaline Phosphatase: 62 U/L (ref 38–126)
Anion gap: 6 (ref 5–15)
BUN: 13 mg/dL (ref 8–23)
CO2: 28 mmol/L (ref 22–32)
Calcium: 8.8 mg/dL — ABNORMAL LOW (ref 8.9–10.3)
Chloride: 101 mmol/L (ref 98–111)
Creatinine, Ser: 0.89 mg/dL (ref 0.61–1.24)
GFR, Estimated: >60 mL/min (ref >60)
Glucose, Bld: 104 mg/dL — ABNORMAL HIGH (ref 70–99)
Potassium: 4.3 mmol/L (ref 3.5–5.1)
Sodium: 135 mmol/L (ref 135–145)
Total Bilirubin: 0.6 mg/dL (ref 0.0–1.2)
Total Protein: 6.3 g/dL — ABNORMAL LOW (ref 6.5–8.1)

## 2023-02-18 LAB — CBC
HCT: 35.1 % — ABNORMAL LOW (ref 39.0–52.0)
Hemoglobin: 11.4 g/dL — ABNORMAL LOW (ref 13.0–17.0)
MCH: 30.7 pg (ref 26.0–34.0)
MCHC: 32.5 g/dL (ref 30.0–36.0)
MCV: 94.6 fL (ref 80.0–100.0)
Platelets: 351 10*3/uL (ref 150–400)
RBC: 3.71 MIL/uL — ABNORMAL LOW (ref 4.22–5.81)
RDW: 14.4 % (ref 11.5–15.5)
WBC: 11.5 10*3/uL — ABNORMAL HIGH (ref 4.0–10.5)
nRBC: 0 % (ref 0.0–0.2)

## 2023-02-18 MED ORDER — OXYCODONE HCL 5 MG PO TABS: 5.0000 mg | ORAL_TABLET | ORAL | Status: DC | PRN

## 2023-02-18 MED ORDER — OXYCODONE HCL 5 MG PO TABS: 5.0000 mg | ORAL_TABLET | ORAL | 0 refills | Status: AC | PRN

## 2023-02-18 MED ORDER — MOMETASONE FURO-FORMOTEROL FUM 100-5 MCG/ACT IN AERO: 2.0000 | INHALATION_SPRAY | Freq: Two times a day (BID) | RESPIRATORY_TRACT | 0 refills | Status: AC

## 2023-02-18 NOTE — Discharge Summary (Signed)
Physician Discharge Summary  Patient: Aaron Stevens UEA:[id number] DOB: 1955-07-23   Code Status: Full Code Admit date: 02/10/2023 Discharge date: 02/18/2023 Disposition: Home, No home health services recommended PCP: Maretta Bees, PA  Recommendations for Outpatient Follow-up:  Follow up with PCP within 1-2 weeks Regarding general hospital follow up and preventative care Follow up with general surgery Follow up with GI  Discharge Diagnoses:  Principal Problem:   Cholecystitis Active Problems:   Emphysema of lung (HCC)   Tobacco use   Normocytic anemia   Cholelithiasis  Brief Hospital Course Summary: 68 y.o. male with medical history significant of atherosclerosis, emphysema, history of pneumonia, right eye cataract who was admitted last month from 01/09/2023 until 01/14/2023 due to acute cholecystitis with cholelithiasis undergoing a percutaneous drain placement on 01/12/2023 with GI recommending that the patient will go EUS in the future but he returned 02/10/23 with complaints of right upper quadrant pain associated with increased output from his percutaneous cholecystostomy drain and mild erythema at insertion site and his wife unable to flush the tube. In the ED hemodynamically stable afebrile. Labs with a stable CMP, mild leukocytosis 11 normal lactic acid. IR consulted to address drain.  GI and surgery has been consulted as well.  He will need EUS for evaluation of double duct sign at some point before cholecystectomy. 1/13: Cholecystostomy tube exchange by IR 1/14: CT YNW:[id number] in appropriate position with persistent gallbladder wall thickening and pericholecystic fluid. Persistent mild intrahepatic and extrahepatic biliary dilatation 1/15: EUS> no significant abnormality in the ampulla, CBD 8 mm few benign lymph nodes visualized cyst was found in the left lobe of the liver 10 x 10, finding of chronic pancreatitis noticed, and GI advised to proceed with  cholecystectomy.  Underwent cholecystectomy on 1/17  Discharge Condition: {DISCHARGE CONDITION:19696}, improved Recommended discharge diet: {Discharge NGEX:[id number]}  Consultations: ***  Procedures/Studies: ***  Discharge Instructions     Discharge patient   Complete by: As directed    Discharge disposition: 01-Home or Self Care   Discharge patient date: 02/18/2023      Allergies as of 02/18/2023       Reactions   Sulfa Antibiotics Anaphylaxis, Other (See Comments)   Bee Venom Other (See Comments)        Medication List     STOP taking these medications    OVER THE COUNTER MEDICATION   oxyCODONE-acetaminophen 5-325 MG tablet Commonly known as: Percocet       TAKE these medications    acetaminophen 650 MG CR tablet Commonly known as: TYLENOL Take 1,300 mg by mouth every 8 (eight) hours as needed for pain.   albuterol 108 (90 Base) MCG/ACT inhaler Commonly known as: VENTOLIN HFA Inhale 2 puffs into the lungs every 6 (six) hours as needed for wheezing or shortness of breath. What changed:  how much to take when to take this   gabapentin 300 MG capsule Commonly known as: NEURONTIN Take 900 mg by mouth at bedtime.   methocarbamol 500 MG tablet Commonly known as: ROBAXIN Take 1 tablet (500 mg total) by mouth 3 (three) times daily.   mometasone-formoterol 100-5 MCG/ACT Aero Commonly known as: DULERA Inhale 2 puffs into the lungs 2 (two) times daily.   nicotine 21 mg/24hr patch Commonly known as: NICODERM CQ - dosed in mg/24 hours Place 1 patch (21 mg total) onto the skin daily.   oxyCODONE 5 MG immediate release tablet Commonly known as: Oxy IR/ROXICODONE Take 1 tablet (5 mg total) by mouth every  4 (four) hours as needed for up to 3 days for moderate pain (pain score 4-6) or severe pain (pain score 7-10) (5 mg for moderate pain. 10 mg for severe pain).   Spacer/Aero-Holding Rudean Curt 1 each by Does not apply route as needed.         Follow-up Information     Maczis, Hedda Slade, PA-C Follow up on 03/06/2023.   Specialty: General Surgery Why: 830 am appt Contact information: [address] [facility] [place] [address] [phone]                 Subjective   Pt reports ***  All questions and concerns were addressed at time of discharge.  Objective  Blood pressure 133/74, pulse 84, temperature 99.2 F (37.3 C), temperature source Oral, resp. rate 16, height 5\' 6"  (1.676 m), weight 54.9 kg, SpO2 93%.   General: Pt is alert, awake, not in acute distress Cardiovascular: RRR, S1/S2 +, no rubs, no gallops Respiratory: CTA bilaterally, no wheezing, no rhonchi Abdominal: Soft, NT, ND, bowel sounds + Extremities: no edema, no cyanosis  The results of significant diagnostics from this hospitalization (including imaging, microbiology, ancillary and laboratory) are listed below for reference.   Imaging studies: CT ABDOMEN PELVIS W CONTRAST Result Date: 02/13/2023 CLINICAL DATA:  Abdominal pain, acute, nonlocalized EXAM: CT ABDOMEN AND PELVIS WITH CONTRAST TECHNIQUE: Multidetector CT imaging of the abdomen and pelvis was performed using the standard protocol following bolus administration of intravenous contrast. RADIATION DOSE REDUCTION: This exam was performed according to the departmental dose-optimization program which includes automated exposure control, adjustment of the mA and/or kV according to patient size and/or use of iterative reconstruction technique. CONTRAST:  OMNIPAQUE IOHEXOL 300 MG/ML  SOLN COMPARISON:  CT abdomen pelvis 02/09/2022, MR abdomen 01/10/2023 FINDINGS: Lower chest: Severe atherosclerotic plaque.  Tiny hiatal hernia. Hepatobiliary: Subcentimeter hypodensities too small to characterize. Fluid density lesion within left hepatic lobe likely represents a simple hepatic cyst. Cholecystostomy tube with tip within the gallbladder lumen. Adenomyomatosis.  Persistent gallbladder wall thickening and pericholecystic fluid. Persistent mild intrahepatic and extrahepatic biliary dilatation. Pancreas: No focal lesion. Normal pancreatic contour. No surrounding inflammatory changes. No main pancreatic ductal dilatation. Spleen: Normal in size without focal abnormality. Adrenals/Urinary Tract: Horseshoe kidney again noted. No adrenal nodule bilaterally. Bilateral kidneys enhance symmetrically. No hydronephrosis. No hydroureter. The urinary bladder is unremarkable. Stomach/Bowel: Stomach is within normal limits. No evidence of bowel wall thickening or dilatation. The appendix is not definitely identified with no inflammatory changes in the right lower quadrant to suggest acute appendicitis. Vascular/Lymphatic: No abdominal aorta or iliac aneurysm. Severe atherosclerotic plaque of the aorta and its branches. No abdominal, pelvic, or inguinal lymphadenopathy. Reproductive: Prostate is enlarged measuring up to 4.8 cm. Other: No intraperitoneal free fluid. No intraperitoneal free gas. No organized fluid collection. Musculoskeletal: No abdominal wall hernia or abnormality. No suspicious lytic or blastic osseous lesions. No acute displaced fracture. 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 IMPRESSION: 1. Cholecystostomy in appropriate position with persistent gallbladder wall thickening and pericholecystic fluid. Persistent mild intrahepatic and extrahepatic biliary dilatation. Adenomyomatosis. 2. Tiny hiatal hernia. 3. Prostatomegaly. 4. Horseshoe kidney. 5.  Aortic Atherosclerosis (ICD10-I70.0). Electronically Signed   By: Tish Frederickson M.D.   On: 02/13/2023 20:37   IR EXCHANGE BILIARY DRAIN Result Date: 02/12/2023 INDICATION: Calculus cholecystitis, painful cholecystostomy tube with daily flushing EXAM: FLUOROSCOPIC EXCHANGE OF THE 10 FRENCH CHOLECYSTOSTOMY MEDICATIONS: 1% lidocaine local ANESTHESIA/SEDATION:  None. FLUOROSCOPY:  Radiation Exposure Index (as provided by the fluoroscopic device): 4.0 mGy Kerma COMPLICATIONS: None immediate. PROCEDURE: Informed written consent was obtained from the patient after a thorough discussion of the procedural risks, benefits and alternatives. All questions were addressed. Maximal Sterile Barrier Technique was utilized including caps, mask, sterile gowns, sterile gloves, sterile drape, hand hygiene and skin antiseptic. A timeout was performed prior to the initiation of the procedure. Under sterile conditions and local anesthesia, the existing cholecystostomy was injected with contrast confirming position in the gallbladder fundus. Sludge and stones noted within the gallbladder. Cystic duct is obstructed. Catheter successfully exchanged over a Amplatz guidewire. Contrast injection reconfirms position. Catheter secured with a silk suture and connected to external gravity drainage bag. Sterile dressing applied. No immediate complication. Patient tolerated the procedure well. IMPRESSION: Successful fluoroscopic exchange of the 10 [place] cholecystostomy Electronically Signed   By: Judie Petit.  Shick M.D.   On: 02/12/2023 16:49   CT ABDOMEN PELVIS W CONTRAST Result Date: 02/10/2023 CLINICAL DATA:  Worsening right abdominal pain. Acute cholecystitis. Percutaneous cholecystostomy. EXAM: CT ABDOMEN AND PELVIS WITH CONTRAST TECHNIQUE: Multidetector CT imaging of the abdomen and pelvis was performed using the standard protocol following bolus administration of intravenous contrast. RADIATION DOSE REDUCTION: This exam was performed according to the departmental dose-optimization program which includes automated exposure control, adjustment of the mA and/or kV according to patient size and/or use of iterative reconstruction technique. CONTRAST:  OMNIPAQUE IOHEXOL 300 MG/ML  SOLN COMPARISON:  01/09/2023 FINDINGS: Lower Chest: No acute findings. Hepatobiliary: Several tiny hepatic cysts remains stable. No evidence of  hepatic mass or abscess. Percutaneous cholecystostomy tube is seen in appropriate position. Changes of acute cholecystitis show no significant change mild diffuse biliary ductal dilatation remains stable. Pancreas: No mass or inflammatory changes. No evidence of pancreatic ductal dilatation. Spleen: Within normal limits in size and appearance. Adrenals/Urinary Tract: Horseshoe kidney again noted. No suspicious masses identified. No evidence of ureteral calculi or hydronephrosis. Unremarkable unopacified urinary bladder. Stomach/Bowel: No evidence of obstruction, inflammatory process or abnormal fluid collections. Vascular/Lymphatic: No pathologically enlarged lymph nodes. No acute vascular findings. Reproductive:  Stable mildly enlarged prostate. Other:  None. Musculoskeletal: No suspicious bone lesions identified. 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 IMPRESSION: Percutaneous cholecystostomy tube in appropriate position. Acute cholecystitis, otherwise not significantly changed in appearance. Stable mild diffuse biliary ductal dilatation. Horseshoe kidney. Stable mildly enlarged prostate. Electronically Signed   By: Danae Orleans M.D.   On: 02/10/2023 15:39   IR Sinus/Fist Tube Chk-Non GI Result Date: 01/25/2023 INDICATION: History of acute cholecystitis, post recent cholecystostomy tube placement on 01/12/2023. Catheter site discomfort EXAM: CHOLECYSTOSTOMY TUBE INJECTION COMPARISON:  IR fluoroscopy, 01/12/2023. MEDICATIONS: None ANESTHESIA/SEDATION: None CONTRAST:  10mL OMNIPAQUE IOHEXOL 300 MG/ML SOLN - administered into the gallbladder fossa. FLUOROSCOPY TIME:  Fluoroscopic dose; 1 mGy COMPLICATIONS: None immediate. PROCEDURE: The patient was positioned supine on the fluoroscopy table. A preprocedural spot fluoroscopic image was obtained of the right upper abdominal quadrant existing cholecystostomy tube. Multiple spot fluoroscopic radiographic images were  obtained of the right upper abdominal quadrant an existing cholecystostomy tube following injection of a small amount of contrast. Images were reviewed and discussed with the patient. The cholecystostomy tube was flushed with a small amount of saline and connected to drainage bag. A dressing was placed. The patient tolerated the procedure well without immediate postprocedural complication. FINDINGS: *Preprocedural spot fluoroscopic image of the right upper abdominal quadrant demonstrates grossly unchanged positioning of  the cholecystostomy tube with end coiled and locked overlying the expected location of the gallbladder fundus. *Contrast injection demonstrates appropriate functionality of the cholecystostomy tube with brisk opacification of the gallbladder. *There is persistent cystic duct occlusion without the passage of contrast through the cystic duct. IMPRESSION: 1. Appropriately positioned and functioning cholecystostomy tube. No exchange was performed. 2. Non patent cystic duct. PLAN: The patient will return to V[facility] (VIR) for routine drainage catheter evaluation and exchange in 6 weeks. Roanna Banning, MD [facility] Electronically Signed   By: Roanna Banning M.D.   On: 01/25/2023 16:40    Labs: Basic Metabolic Panel: Recent Labs  Lab 02/13/23 0446 02/14/23 0453 02/16/23 0508 02/17/23 0532 02/18/23 0501  NA 134* 135 139 136 135  K 4.4 4.2 4.0 4.3 4.3  CL 101 101 103 101 101  CO2 27 23 24 29 28   GLUCOSE 97 91 95 124* 104*  BUN 15 16 13 18 13   CREATININE 1.01 1.08 0.85 1.02 0.89  CALCIUM 8.9 9.0 9.1 8.9 8.8*   CBC: Recent Labs  Lab 02/14/23 0453 02/15/23 0906 02/16/23 0508 02/17/23 0532 02/18/23 0501  WBC 15.6* 11.9* 11.1* 15.0* 11.5*  HGB 12.6* 11.7* 12.2* 11.9* 11.4*  HCT 39.4 35.5* 37.1* 35.8* 35.1*  MCV 94.9 92.7 93.5 93.5 94.6  PLT 258 243 274 359 351   Microbiology: ***  Time  coordinating discharge: Over 30 minutes  Leeroy Bock, MD  [facility] 02/18/2023, 10:40 AM

## 2023-02-18 NOTE — Progress Notes (Signed)
Mobility Specialist - Progress Note   02/18/23 0911  Mobility  Activity Ambulated independently in hallway  Level of Assistance Independent  Assistive Device None  Distance Ambulated (ft) 500 ft  Activity Response Tolerated well  Mobility Referral Yes  Mobility visit 1 Mobility  Mobility Specialist Start Time (ACUTE ONLY) [id number]  Mobility Specialist Stop Time (ACUTE ONLY) 0910  Mobility Specialist Time Calculation (min) (ACUTE ONLY) 7 min   Pt received in recliner and agreeable to mobility. No complaints during session. Pt to recliner after session with all needs met.    [facility]

## 2023-02-18 NOTE — Progress Notes (Signed)
2 Days Post-Op   Subjective/Chief Complaint: Doing well. Excited for possible discharge today.    Objective: Vital signs in last 24 hours: Temp:  [98.8 F (37.1 C)-99.2 F (37.3 C)] 99.2 F (37.3 C) (01/19 0607) Pulse Rate:  [73-84] 84 (01/19 0607) Resp:  [14-16] 16 (01/19 0607) BP: (120-133)/(64-74) 133/74 (01/19 0607) SpO2:  [91 %-97 %] 91 % (01/19 0607) Last BM Date : 02/16/23  Intake/Output from previous day: 01/18 0701 - 01/19 0700 In: 820.5 [P.O.:780; IV Piggyback:40.5] Out: -  Intake/Output this shift: No intake/output data recorded.  General appearance: alert and cooperative GI: soft, non-tender; bowel sounds normal; no masses,  no organomegaly and inc c/d/i  Lab Results:  Recent Labs    02/17/23 0532 02/18/23 0501  WBC 15.0* 11.5*  HGB 11.9* 11.4*  HCT 35.8* 35.1*  PLT 359 351   BMET Recent Labs    02/17/23 0532 02/18/23 0501  NA 136 135  K 4.3 4.3  CL 101 101  CO2 29 28  GLUCOSE 124* 104*  BUN 18 13  CREATININE 1.02 0.89  CALCIUM 8.9 8.8*   PT/INR No results for input(s): "LABPROT", "INR" in the last 72 hours. ABG No results for input(s): "PHART", "HCO3" in the last 72 hours.  Invalid input(s): "PCO2", "PO2"  Studies/Results: No results found.  Anti-infectives: Anti-infectives (From admission, onward)    Start     Dose/Rate Route Frequency Ordered Stop   02/11/23 0900  piperacillin-tazobactam (ZOSYN) IVPB 3.375 g        3.375 g 12.5 mL/hr over 240 Minutes Intravenous Every 8 hours 02/11/23 0815     02/10/23 1730  piperacillin-tazobactam (ZOSYN) IVPB 3.375 g        3.375 g 12.5 mL/hr over 240 Minutes Intravenous  Once 02/10/23 1716 02/10/23 2130       Assessment/Plan: s/p Procedure(s): LAPAROSCOPIC CHOLECYSTECTOMY WITH ICG (N/A) - Okay for discharge from surgery perspective - Will call to arrange follow up   LOS: 8 days    Moise Boring 02/18/2023

## 2023-02-19 LAB — SURGICAL PATHOLOGY

## 2023-02-23 ENCOUNTER — Encounter: Payer: Self-pay | Admitting: Urgent Care

## 2023-02-23 ENCOUNTER — Ambulatory Visit (HOSPITAL_BASED_OUTPATIENT_CLINIC_OR_DEPARTMENT_OTHER)
Admission: RE | Admit: 2023-02-23 | Discharge: 2023-02-23 | Disposition: A | Discharge status: Home / Self Care | Payer: Medicare Other | Source: Ambulatory Visit | Attending: Urgent Care | Admitting: Urgent Care

## 2023-02-23 ENCOUNTER — Inpatient Hospital Stay: Payer: Medicare Other | Admitting: Urgent Care

## 2023-02-23 ENCOUNTER — Ambulatory Visit: Payer: Medicare Other | Admitting: Urgent Care

## 2023-02-23 VITALS — BP 124/75 | HR 69 | Wt 129.8 lb

## 2023-02-23 DIAGNOSIS — Z789 Other specified health status: Secondary | ICD-10-CM

## 2023-02-23 DIAGNOSIS — B3781 Candidal esophagitis: Secondary | ICD-10-CM | POA: Diagnosis not present

## 2023-02-23 DIAGNOSIS — J439 Emphysema, unspecified: Secondary | ICD-10-CM | POA: Diagnosis not present

## 2023-02-23 DIAGNOSIS — R1909 Other intra-abdominal and pelvic swelling, mass and lump: Secondary | ICD-10-CM | POA: Insufficient documentation

## 2023-02-23 DIAGNOSIS — Z9049 Acquired absence of other specified parts of digestive tract: Secondary | ICD-10-CM

## 2023-02-23 DIAGNOSIS — B370 Candidal stomatitis: Secondary | ICD-10-CM | POA: Diagnosis not present

## 2023-02-23 DIAGNOSIS — D649 Anemia, unspecified: Secondary | ICD-10-CM | POA: Diagnosis not present

## 2023-02-23 DIAGNOSIS — R1033 Periumbilical pain: Secondary | ICD-10-CM | POA: Insufficient documentation

## 2023-02-23 DIAGNOSIS — R683 Clubbing of fingers: Secondary | ICD-10-CM

## 2023-02-23 DIAGNOSIS — Z720 Tobacco use: Secondary | ICD-10-CM

## 2023-02-23 LAB — CBC WITH DIFFERENTIAL/PLATELET
Basophils Absolute: 0.4 10*3/uL — ABNORMAL HIGH (ref 0.0–0.1)
Basophils Relative: 3.1 % — ABNORMAL HIGH (ref 0.0–3.0)
Eosinophils Absolute: 0.4 10*3/uL (ref 0.0–0.7)
Eosinophils Relative: 2.7 % (ref 0.0–5.0)
HCT: 37.5 % — ABNORMAL LOW (ref 39.0–52.0)
Hemoglobin: 12 g/dL — ABNORMAL LOW (ref 13.0–17.0)
Lymphocytes Relative: 16.9 % (ref 12.0–46.0)
Lymphs Abs: 2.3 10*3/uL (ref 0.7–4.0)
MCHC: 32 g/dL (ref 30.0–36.0)
MCV: 94.4 fL (ref 78.0–100.0)
Monocytes Absolute: 1.2 10*3/uL — ABNORMAL HIGH (ref 0.1–1.0)
Monocytes Relative: 8.8 % (ref 3.0–12.0)
Neutro Abs: 9.3 10*3/uL — ABNORMAL HIGH (ref 1.4–7.7)
Neutrophils Relative %: 68.5 % (ref 43.0–77.0)
Platelets: 694 10*3/uL — ABNORMAL HIGH (ref 150.0–400.0)
RBC: 3.98 Mil/uL — ABNORMAL LOW (ref 4.22–5.81)
RDW: 15.8 % — ABNORMAL HIGH (ref 11.5–15.5)
WBC: 13.6 10*3/uL — ABNORMAL HIGH (ref 4.0–10.5)

## 2023-02-23 LAB — COMPREHENSIVE METABOLIC PANEL
ALT: 13 U/L (ref 0–53)
AST: 15 U/L (ref 0–37)
Albumin: 3.7 g/dL (ref 3.5–5.2)
Alkaline Phosphatase: 93 U/L (ref 39–117)
BUN: 15 mg/dL (ref 6–23)
CO2: 29 meq/L (ref 19–32)
Calcium: 9 mg/dL (ref 8.4–10.5)
Chloride: 102 meq/L (ref 96–112)
Creatinine, Ser: 0.73 mg/dL (ref 0.40–1.50)
GFR: 93.53 mL/min (ref >60.00)
Glucose, Bld: 81 mg/dL (ref 70–99)
Potassium: 4.7 meq/L (ref 3.5–5.1)
Sodium: 140 meq/L (ref 135–145)
Total Bilirubin: 0.4 mg/dL (ref 0.2–1.2)
Total Protein: 6.2 g/dL (ref 6.0–8.3)

## 2023-02-23 MED ORDER — METHOCARBAMOL 500 MG PO TABS: 500.0000 mg | ORAL_TABLET | Freq: Three times a day (TID) | ORAL | 3 refills | Status: AC

## 2023-02-23 MED ORDER — NYSTATIN 100000 UNIT/ML MT SUSP: 5.0000 mL | Freq: Four times a day (QID) | OROMUCOSAL | 0 refills | Status: AC

## 2023-02-23 MED ORDER — OXYCODONE HCL 5 MG PO TABS: 5.0000 mg | ORAL_TABLET | Freq: Four times a day (QID) | ORAL | 0 refills | Status: AC | PRN

## 2023-02-23 NOTE — Assessment & Plan Note (Signed)
Post-Operative Abdominal Pain Patient reports localized pain and swelling around the umbilicus following recent cholecystectomy. No associated nausea, vomiting, or changes in bowel movements. Physical examination reveals a palpable abnormality and bruising in the area of discomfort. -Order stat abdominal ultrasound to rule out hernia or seroma. Tx plan based upon results of stat study -Follow-up appointment in one month to assess healing and resolution of symptoms.

## 2023-02-23 NOTE — Assessment & Plan Note (Signed)
Post-op pain Patient is currently taking Methocarbamol, Oxycodone, and tylenol. Patient reports adequate pain control with current regimen and no adverse effects. -Refill Methocarbamol and Oxycodone prescriptions. -Advise patient to gradually reduce Oxycodone use as pain improves to avoid long-term dependence with goal of complete cessation within the month

## 2023-02-23 NOTE — Assessment & Plan Note (Signed)
Former. Pt has not smoked since 02/10/23 and is no longer using the nicotine patch.  No symptoms of withdrawal

## 2023-02-23 NOTE — Assessment & Plan Note (Signed)
Patient reports a burning sensation in the mouth when eating certain foods, which started after recent hospitalization. Physical examination reveals a few white spots on the roof of the mouth. Patient is using Dulera inhaler without rinsing mouth afterwards, which may have led to oral thrush. -Prescribe Nystatin mouthwash, 5 mL four times daily. Swish for 90 seconds and swallow. Continue for up to one week or until symptoms resolve. -Instruct patient to rinse mouth with water and spit after using Dulera inhaler to prevent recurrence of oral thrush.

## 2023-02-23 NOTE — Progress Notes (Signed)
Established Patient Office Visit  Subjective:  Patient ID: Aaron Stevens, male    DOB: 1955/12/14  Age: 67 y.o. MRN: [id number]  Chief Complaint  Patient presents with   Hospitalization Follow-up    Hospital follow up. Pt states he's still sore but feels better. He needs a refill on his Oxycodone.    Discussed the use of AI software for clinical note transcription with the patient who gave verbal consent to proceed.   68yo male presents for follow up and transition of care. He was hospitalized from 02/10/23 - 02/18/23, and underwent a cholecystectomy on 02/16/23. He presented today with a new onset of oral discomfort. He reported a sensation of 'fire' in his mouth, particularly after eating certain foods, such as lettuce. This symptom started after his recent hospital discharge, where he underwent gallbladder and gallstone removal. The patient noted that not all foods trigger this sensation, with sausage biscuits and chicken being well-tolerated. EGD was not recently performed, however pt was just started on [facility] and notes he is not rinsing his mouth after each use.  In addition to the oral discomfort, the patient reported abdominal bloating and swelling, particularly around the umbilical area. This symptom started approximately three to four days prior to the consultation (2 days s/p discharge). The patient described the discomfort as constant and not influenced by positional changes. He also reported having two bowel movements the day before the consultation, which was his first since the surgery.  The patient's medication regimen included Dulera BID and albuterol BID for his emphysema, which is working well. No respiratory complaints. He also reported taking Tylenol and Oxycodone for pain management (just twice daily, upon awakening and before bed), Gabapentin for a pre-existing foot condition, and Robaxin, a muscle relaxer. The patient had recently quit smoking (on 02/10/23) and was not  using a nicotine patch since his hospital discharge. He has been nicotine free for 14 days. Denies any withdrawal symptoms.    Patient Active Problem List   Diagnosis Date Noted   Hypocalcemia 02/23/2023   Umbilical mass 02/23/2023   Thrush of mouth and esophagus (HCC) 02/23/2023   Umbilical pain 02/23/2023   Cholecystitis 02/10/2023   Normocytic anemia 02/10/2023   Cholelithiasis 02/10/2023   RUQ abdominal pain 01/26/2023   Screening cholesterol level 01/26/2023   Weight loss, unintentional 01/26/2023   Other specified diseases of liver 01/26/2023   Clubbing of nail 01/26/2023   Chronic foot pain, right 01/26/2023   Choledocholithiasis with acute cholecystitis 01/09/2023   Tobacco use 01/09/2023   Emphysema of lung (HCC)    Nuclear sclerotic cataract of right eye 05/12/2014   Past Medical History:  Diagnosis Date   Atherosclerosis of artery    Emphysema of lung (HCC)    Pneumonia    Past Surgical History:  Procedure Laterality Date   APPENDECTOMY     CHOLECYSTECTOMY N/A 02/16/2023   Procedure: LAPAROSCOPIC CHOLECYSTECTOMY WITH ICG;  Surgeon: Emelia Loron, MD;  Location: [facility];  Service: General;  Laterality: N/A;   ESOPHAGOGASTRODUODENOSCOPY N/A 02/14/2023   Procedure: ESOPHAGOGASTRODUODENOSCOPY (EGD);  Surgeon: Willis Modena, MD;  Location: [facility];  Service: Gastroenterology;  Laterality: N/A;   EUS Left 02/14/2023   Procedure: UPPER ENDOSCOPIC ULTRASOUND (EUS) LINEAR;  Surgeon: Willis Modena, MD;  Location: [facility];  Service: Gastroenterology;  Laterality: Left;   IR EXCHANGE BILIARY DRAIN  02/12/2023   IR PERC CHOLECYSTOSTOMY  01/12/2023   IR SINUS/FIST TUBE CHK-NON GI  01/25/2023   KNEE SURGERY  Social History   Tobacco Use   Smoking status: Former    Types: Cigarettes   Smokeless tobacco: Never   Tobacco comments:    Quit Feb 10, 2023  Vaping Use   Vaping status: Never Used  Substance Use Topics   Alcohol use: No   Drug use: No       ROS: as noted in HPI  Objective:     BP 124/75   Pulse 69   Wt 129 lb 12.8 oz (58.9 kg)   SpO2 99%   BMI 20.95 kg/m  BP Readings from Last 3 Encounters:  02/23/23 124/75  02/18/23 133/74  01/26/23 129/75   Wt Readings from Last 3 Encounters:  02/23/23 129 lb 12.8 oz (58.9 kg)  02/16/23 121 lb 0.5 oz (54.9 kg)  01/26/23 121 lb 1.9 oz (54.9 kg)      Physical Exam Vitals and nursing note reviewed.  Constitutional:      General: He is not in acute distress.    Appearance: Normal appearance. He is not ill-appearing, toxic-appearing or diaphoretic.  HENT:     Nose: Nose normal. No congestion.     Mouth/Throat:     Mouth: Mucous membranes are dry.     Pharynx: Oropharyngeal exudate (thrush noted to palate of dentures, questionable extension to posterior pharynx) present. No posterior oropharyngeal erythema.  Eyes:     General: No scleral icterus.       Right eye: No discharge.        Left eye: No discharge.  Cardiovascular:     Rate and Rhythm: Normal rate and regular rhythm.  Pulmonary:     Effort: Pulmonary effort is normal. No respiratory distress.     Breath sounds: Normal breath sounds. No stridor. No wheezing or rhonchi.  Abdominal:     General: Abdomen is flat. Bowel sounds are normal. There is no distension.     Palpations: Abdomen is soft.     Tenderness: There is abdominal tenderness in the periumbilical area.    Musculoskeletal:     Cervical back: Normal range of motion.  Lymphadenopathy:     Cervical: No cervical adenopathy.  Skin:    General: Skin is warm and dry.     Coloration: Skin is not jaundiced.     Findings: No erythema.  Neurological:     General: No focal deficit present.     Mental Status: He is alert and oriented to person, place, and time.     Motor: No weakness.  Psychiatric:        Mood and Affect: Mood normal.        Behavior: Behavior normal.      No results found for any visits on 02/23/23.  Last CBC Lab Results   Component Value Date   WBC 11.5 (H) 02/18/2023   HGB 11.4 (L) 02/18/2023   HCT 35.1 (L) 02/18/2023   MCV 94.6 02/18/2023   MCH 30.7 02/18/2023   RDW 14.4 02/18/2023   PLT 351 02/18/2023   Last metabolic panel Lab Results  Component Value Date   GLUCOSE 104 (H) 02/18/2023   NA 135 02/18/2023   K 4.3 02/18/2023   CL 101 02/18/2023   CO2 28 02/18/2023   BUN 13 02/18/2023   CREATININE 0.89 02/18/2023   GFRNONAA >60 02/18/2023   CALCIUM 8.8 (L) 02/18/2023   PROT 6.3 (L) 02/18/2023   ALBUMIN 3.0 (L) 02/18/2023   BILITOT 0.6 02/18/2023   ALKPHOS 62 02/18/2023   AST 22 02/18/2023  ALT 16 02/18/2023   ANIONGAP 6 02/18/2023   Last lipids Lab Results  Component Value Date   CHOL 161 01/26/2023   HDL 61 01/26/2023   LDLCALC 85 01/26/2023   TRIG 68 01/26/2023   CHOLHDL 2.6 01/26/2023   Last hemoglobin A1c No results found for: "HGBA1C" Last thyroid functions Lab Results  Component Value Date   TSH 6.48 (H) 01/26/2023   Last vitamin D No results found for: "25OHVITD2", "25OHVITD3", "VD25OH"    The 10-year ASCVD risk score (Arnett DK, et al., 2019) is: 12%  Assessment & Plan:  Transitioned from acute care to self-care  History of laparoscopic cholecystectomy -     Methocarbamol; Take 1 tablet (500 mg total) by mouth 3 (three) times daily.  Dispense: 90 tablet; Refill: 3 -     oxyCODONE HCl; Take 1 tablet (5 mg total) by mouth every 6 (six) hours as needed for severe pain (pain score 7-10).  Dispense: 21 tablet; Refill: 0  Pulmonary emphysema, unspecified emphysema type (HCC) Assessment & Plan: Improving on combo dulera and albuterol. No current respiratory complaints. RINSE mouth out after each use of dulera.   Clubbing of nail  Tobacco use Assessment & Plan: Former. Pt has not smoked since 02/10/23 and is no longer using the nicotine patch.  No symptoms of withdrawal   Normocytic anemia -     CBC with Differential/Platelet  Umbilical pain Assessment &  Plan: Post-op pain Patient is currently taking Methocarbamol, Oxycodone, and tylenol. Patient reports adequate pain control with current regimen and no adverse effects. -Refill Methocarbamol and Oxycodone prescriptions. -Advise patient to gradually reduce Oxycodone use as pain improves to avoid long-term dependence with goal of complete cessation within the month  Orders: -     Methocarbamol; Take 1 tablet (500 mg total) by mouth 3 (three) times daily.  Dispense: 90 tablet; Refill: 3 -     oxyCODONE HCl; Take 1 tablet (5 mg total) by mouth every 6 (six) hours as needed for severe pain (pain score 7-10).  Dispense: 21 tablet; Refill: 0 -     US Abdomen Complete; Future  Umbilical mass Assessment & Plan: Post-Operative Abdominal Pain Patient reports localized pain and swelling around the umbilicus following recent cholecystectomy. No associated nausea, vomiting, or changes in bowel movements. Physical examination reveals a palpable abnormality and bruising in the area of discomfort. -Order stat abdominal ultrasound to rule out hernia or seroma. Tx plan based upon results of stat study -Follow-up appointment in one month to assess healing and resolution of symptoms.  Orders: -     US Abdomen Complete; Future  Thrush of mouth and esophagus (HCC) Assessment & Plan: Patient reports a burning sensation in the mouth when eating certain foods, which started after recent hospitalization. Physical examination reveals a few white spots on the roof of the mouth. Patient is using Dulera inhaler without rinsing mouth afterwards, which may have led to oral thrush. -Prescribe Nystatin mouthwash, 5 mL four times daily. Swish for 90 seconds and swallow. Continue for up to one week or until symptoms resolve. -Instruct patient to rinse mouth with water and spit after using Dulera inhaler to prevent recurrence of oral thrush.  Orders: -     Nystatin; Take 5 mLs (500,000 Units total) by mouth 4 (four) times  daily. Swish for 90 seconds and swallow  Dispense: 180 mL; Refill: 0  Hypocalcemia -     Comprehensive metabolic panel   Follow-up Patient has a follow-up appointment with the general  surgeon on February 4th. -Check CBC as per hospital discharge summary. -Review ultrasound results and adjust plan as necessary based on findings. -Follow up in our office in one month; ER precautions reviewed  Return in about 4 weeks (around 03/23/2023).   Maretta Bees, PA

## 2023-02-23 NOTE — Patient Instructions (Signed)
Continue taking the tylenol 650mg  up to four times daily. Use the oxycodone only for severe (7-10/10) pain.  We have ordered a STAT ultrasound of your abdomen. We will contact you shortly with time and location of scan.  Please RINSE your mouth with water each and every time you use Dulera. This will prevent thrush Your current burning mouth symptoms are likely due to esophageal thrush; Please use 5mL of nystatin and swish in your mouth for 90 seconds, then swallow. Do this for up to one week, or until your mouth symptoms resolve.  Please return for recheck in one month.

## 2023-02-23 NOTE — Assessment & Plan Note (Signed)
Improving on combo dulera and albuterol. No current respiratory complaints. RINSE mouth out after each use of dulera.

## 2023-02-27 ENCOUNTER — Other Ambulatory Visit (HOSPITAL_COMMUNITY): Payer: Self-pay | Admitting: Student

## 2023-02-27 DIAGNOSIS — R1011 Right upper quadrant pain: Secondary | ICD-10-CM

## 2023-02-27 NOTE — Progress Notes (Signed)
Thank you so much for following up. All surgical complications will now be managed by Gen surg.

## 2023-02-28 ENCOUNTER — Ambulatory Visit (HOSPITAL_COMMUNITY)
Admission: RE | Admit: 2023-02-28 | Discharge: 2023-02-28 | Disposition: A | Discharge status: Home / Self Care | Payer: Medicare Other | Source: Ambulatory Visit | Attending: Student | Admitting: Student

## 2023-02-28 ENCOUNTER — Other Ambulatory Visit: Payer: Self-pay

## 2023-02-28 ENCOUNTER — Encounter (HOSPITAL_COMMUNITY): Payer: Self-pay

## 2023-02-28 ENCOUNTER — Inpatient Hospital Stay (HOSPITAL_COMMUNITY)
Admission: EM | Admit: 2023-02-28 | Discharge: 2023-03-02 | DRG: 862 | Disposition: A | Discharge status: Home / Self Care | Payer: Medicare Other | Attending: Surgery | Admitting: Surgery

## 2023-02-28 DIAGNOSIS — K651 Peritoneal abscess: Secondary | ICD-10-CM | POA: Diagnosis present

## 2023-02-28 DIAGNOSIS — R1011 Right upper quadrant pain: Secondary | ICD-10-CM | POA: Insufficient documentation

## 2023-02-28 DIAGNOSIS — J439 Emphysema, unspecified: Secondary | ICD-10-CM | POA: Diagnosis present

## 2023-02-28 DIAGNOSIS — Z87891 Personal history of nicotine dependence: Secondary | ICD-10-CM

## 2023-02-28 DIAGNOSIS — T8143XA Infection following a procedure, organ and space surgical site, initial encounter: Secondary | ICD-10-CM | POA: Diagnosis not present

## 2023-02-28 DIAGNOSIS — Z91030 Bee allergy status: Secondary | ICD-10-CM

## 2023-02-28 DIAGNOSIS — Z882 Allergy status to sulfonamides status: Secondary | ICD-10-CM

## 2023-02-28 DIAGNOSIS — Z7951 Long term (current) use of inhaled steroids: Secondary | ICD-10-CM

## 2023-02-28 DIAGNOSIS — R188 Other ascites: Principal | ICD-10-CM | POA: Diagnosis present

## 2023-02-28 DIAGNOSIS — Z79899 Other long term (current) drug therapy: Secondary | ICD-10-CM

## 2023-02-28 LAB — CBC
HCT: 36.5 % — ABNORMAL LOW (ref 39.0–52.0)
Hemoglobin: 11.5 g/dL — ABNORMAL LOW (ref 13.0–17.0)
MCH: 30.1 pg (ref 26.0–34.0)
MCHC: 31.5 g/dL (ref 30.0–36.0)
MCV: 95.5 fL (ref 80.0–100.0)
Platelets: 748 10*3/uL — ABNORMAL HIGH (ref 150–400)
RBC: 3.82 MIL/uL — ABNORMAL LOW (ref 4.22–5.81)
RDW: 14.9 % (ref 11.5–15.5)
WBC: 12.7 10*3/uL — ABNORMAL HIGH (ref 4.0–10.5)
nRBC: 0 % (ref 0.0–0.2)

## 2023-02-28 LAB — COMPREHENSIVE METABOLIC PANEL
ALT: 13 U/L (ref 0–44)
AST: 16 U/L (ref 15–41)
Albumin: 3.2 g/dL — ABNORMAL LOW (ref 3.5–5.0)
Alkaline Phosphatase: 97 U/L (ref 38–126)
Anion gap: 8 (ref 5–15)
BUN: 9 mg/dL (ref 8–23)
CO2: 27 mmol/L (ref 22–32)
Calcium: 9.1 mg/dL (ref 8.9–10.3)
Chloride: 105 mmol/L (ref 98–111)
Creatinine, Ser: 0.74 mg/dL (ref 0.61–1.24)
GFR, Estimated: >60 mL/min (ref >60)
Glucose, Bld: 94 mg/dL (ref 70–99)
Potassium: 4.1 mmol/L (ref 3.5–5.1)
Sodium: 140 mmol/L (ref 135–145)
Total Bilirubin: 0.4 mg/dL (ref 0.0–1.2)
Total Protein: 6.7 g/dL (ref 6.5–8.1)

## 2023-02-28 LAB — PROTIME-INR
INR: 1.1 (ref 0.8–1.2)
Prothrombin Time: 14.3 s (ref 11.4–15.2)

## 2023-02-28 LAB — MAGNESIUM: Magnesium: 2.1 mg/dL (ref 1.7–2.4)

## 2023-02-28 MED ORDER — ACETAMINOPHEN 500 MG PO TABS
1000.0000 mg | ORAL_TABLET | Freq: Four times a day (QID) | ORAL | Status: DC
Start: 2023-02-28 — End: 2023-03-02
  Administered 2023-02-28 – 2023-03-02 (×5): 1000 mg via ORAL
  Filled 2023-02-28 (×6): qty 2

## 2023-02-28 MED ORDER — PIPERACILLIN-TAZOBACTAM 3.375 G IVPB
3.3750 g | Freq: Three times a day (TID) | INTRAVENOUS | Status: DC
  Administered 2023-02-28 – 2023-03-02 (×7): 3.375 g via INTRAVENOUS
  Filled 2023-02-28 (×7): qty 50

## 2023-02-28 MED ORDER — DOCUSATE SODIUM 100 MG PO CAPS
100.0000 mg | ORAL_CAPSULE | Freq: Two times a day (BID) | ORAL | Status: DC
  Administered 2023-02-28 – 2023-03-01 (×2): 100 mg via ORAL
  Filled 2023-02-28 (×3): qty 1

## 2023-02-28 MED ORDER — OXYCODONE HCL 5 MG PO TABS
5.0000 mg | ORAL_TABLET | ORAL | Status: DC | PRN
  Administered 2023-02-28 – 2023-03-01 (×3): 5 mg via ORAL
  Filled 2023-02-28 (×3): qty 1

## 2023-02-28 MED ORDER — METHOCARBAMOL 1000 MG/10ML IJ SOLN: 500.0000 mg | Freq: Three times a day (TID) | INTRAMUSCULAR | Status: DC | PRN

## 2023-02-28 MED ORDER — DIPHENHYDRAMINE HCL 12.5 MG/5ML PO ELIX: 12.5000 mg | ORAL_SOLUTION | Freq: Four times a day (QID) | ORAL | Status: DC | PRN

## 2023-02-28 MED ORDER — HYDRALAZINE HCL 20 MG/ML IJ SOLN: 10.0000 mg | INTRAMUSCULAR | Status: DC | PRN

## 2023-02-28 MED ORDER — METHOCARBAMOL 500 MG PO TABS
500.0000 mg | ORAL_TABLET | Freq: Three times a day (TID) | ORAL | Status: DC | PRN
  Administered 2023-02-28 – 2023-03-01 (×2): 500 mg via ORAL
  Filled 2023-02-28 (×2): qty 1

## 2023-02-28 MED ORDER — TRAMADOL HCL 50 MG PO TABS: 50.0000 mg | ORAL_TABLET | Freq: Four times a day (QID) | ORAL | Status: DC | PRN

## 2023-02-28 MED ORDER — HYDROMORPHONE HCL 1 MG/ML IJ SOLN
0.5000 mg | INTRAMUSCULAR | Status: DC | PRN
  Administered 2023-03-01 – 2023-03-02 (×6): 0.5 mg via INTRAVENOUS
  Filled 2023-02-28 (×7): qty 0.5

## 2023-02-28 MED ORDER — METOPROLOL TARTRATE 5 MG/5ML IV SOLN: 5.0000 mg | Freq: Four times a day (QID) | INTRAVENOUS | Status: DC | PRN

## 2023-02-28 MED ORDER — ONDANSETRON 4 MG PO TBDP: 4.0000 mg | ORAL_TABLET | Freq: Four times a day (QID) | ORAL | Status: DC | PRN

## 2023-02-28 MED ORDER — SIMETHICONE 80 MG PO CHEW: 40.0000 mg | CHEWABLE_TABLET | Freq: Four times a day (QID) | ORAL | Status: DC | PRN

## 2023-02-28 MED ORDER — SODIUM CHLORIDE 0.9 % IV SOLN: INTRAVENOUS | Status: AC

## 2023-02-28 MED ORDER — MELATONIN 3 MG PO TABS
3.0000 mg | ORAL_TABLET | Freq: Every evening | ORAL | Status: DC | PRN
  Administered 2023-02-28: 3 mg via ORAL
  Filled 2023-02-28: qty 1

## 2023-02-28 MED ORDER — IOHEXOL 300 MG/ML  SOLN
100.0000 mL | Freq: Once | INTRAMUSCULAR | Status: AC | PRN
  Administered 2023-02-28: 100 mL via INTRAVENOUS

## 2023-02-28 MED ORDER — ONDANSETRON HCL 4 MG/2ML IJ SOLN: 4.0000 mg | Freq: Four times a day (QID) | INTRAMUSCULAR | Status: DC | PRN

## 2023-02-28 MED ORDER — DIPHENHYDRAMINE HCL 50 MG/ML IJ SOLN: 12.5000 mg | Freq: Four times a day (QID) | INTRAMUSCULAR | Status: DC | PRN

## 2023-02-28 NOTE — ED Triage Notes (Signed)
Patient to ED by POV for abnormal CT. States he was told to f/u in ED for possible leak.

## 2023-02-28 NOTE — H&P (Signed)
Aaron Stevens February 22, 1955  [id number].    Chief Complaint/Reason for Consult: Status post lap chole  HPI: Aaron Stevens is a 68 y.o. male who underwent percutaneous cholecystostomy drain on 01/12/2023. He underwent laparoscopic cholecystectomy on 02/16/2023 by Dr. Dwain Sarna. He found that the gallbladder sat on the common bile duct. The gallbladder was divided and some stones were evacuated. Endoloop was used and gallbladder was reconstituted on the cystic duct. He was discharged on postop day 2.  He followed up with his PCP on 02/23/2023 who ordered a right upper quadrant ultrasound due to abdominal pain and swelling around the umbilicus. Ultrasound showed a 6.4 cm complex lesion in gallbladder fossa, suspicious for biloma versus seroma. He is was seen on 1/27 in the office for a recheck.  A CT scan was obtained and showed a 5.8 x 5.2 cm fluid collection is seen in the gallbladder fossa. He presented to the ED for evaluation.   He reports ongoing right sided abdominal pain. He denies fever, nausea, vomiting. Denies issues with his bowel movements or urination.   ROS: ROS As above, see hpi  No family history on file.  Past Medical History:  Diagnosis Date   Atherosclerosis of artery    Emphysema of lung (HCC)    Pneumonia     Past Surgical History:  Procedure Laterality Date   APPENDECTOMY     CHOLECYSTECTOMY N/A 02/16/2023   Procedure: LAPAROSCOPIC CHOLECYSTECTOMY WITH ICG;  Surgeon: Emelia Loron, MD;  Location: [facility];  Service: General;  Laterality: N/A;   ESOPHAGOGASTRODUODENOSCOPY N/A 02/14/2023   Procedure: ESOPHAGOGASTRODUODENOSCOPY (EGD);  Surgeon: Willis Modena, MD;  Location: [facility];  Service: Gastroenterology;  Laterality: N/A;   EUS Left 02/14/2023   Procedure: UPPER ENDOSCOPIC ULTRASOUND (EUS) LINEAR;  Surgeon: Willis Modena, MD;  Location: [facility];  Service: Gastroenterology;  Laterality: Left;   IR EXCHANGE BILIARY DRAIN  02/12/2023    IR PERC CHOLECYSTOSTOMY  01/12/2023   IR SINUS/FIST TUBE CHK-NON GI  01/25/2023   KNEE SURGERY      Social History:  reports that he has quit smoking. His smoking use included cigarettes. He has never used smokeless tobacco. He reports that he does not drink alcohol and does not use drugs.  Allergies:  Allergies  Allergen Reactions   Sulfa Antibiotics Anaphylaxis and Other (See Comments)   Bee Venom Other (See Comments)    (Not in a hospital admission)    Physical Exam: Blood pressure 136/63, pulse 80, temperature 98.2 F (36.8 C), temperature source Oral, resp. rate 18, height 5\' 10"  (1.778 m), weight 50.2 kg, SpO2 98%.  General: pleasant, WD/WN male who is laying in bed in NAD HEENT: head is normocephalic, atraumatic.  Sclera are non-icteric. Ears and nose without any obvious masses or lesions.  Mouth is pink and moist.  Heart: regular, rate, and rhythm.   Lungs: CTAB, no wheezes, rhonchi, or rales noted.  Respiratory effort nonlabored Abd:  Soft, ND, mild ttp ruq MS: no BUE or BLE edema Skin: warm and dry  Psych: A&Ox4 with an appropriate affect Neuro: normal speech, thought process intact, moves all extremities, gait not assessed   No results found for this or any previous visit (from the past 48 hours). CT ABDOMEN PELVIS W CONTRAST Result Date: 02/28/2023 CLINICAL DATA:  Right upper quadrant abdominal pain status post cholecystectomy 2 weeks ago. EXAM: CT ABDOMEN AND PELVIS WITH CONTRAST TECHNIQUE: Multidetector CT imaging of the abdomen and pelvis was performed using the standard protocol  following bolus administration of intravenous contrast. RADIATION DOSE REDUCTION: This exam was performed according to the departmental dose-optimization program which includes automated exposure control, adjustment of the mA and/or kV according to patient size and/or use of iterative reconstruction technique. CONTRAST:  OMNIPAQUE IOHEXOL 300 MG/ML  SOLN COMPARISON:  February 13, 2023. FINDINGS: Lower chest: No acute abnormality. Hepatobiliary: Stable hepatic cysts. Status post cholecystectomy. 5.8 x 5.2 cm fluid collection is seen in gallbladder fossa most consistent with biloma or less likely abscess given recent surgical history. Mild intrahepatic and extrahepatic biliary dilatation is noted most consistent with post cholecystectomy status. Pancreas: Unremarkable. No pancreatic ductal dilatation or surrounding inflammatory changes. Spleen: Normal in size without focal abnormality. Adrenals/Urinary Tract: Adrenal glands appear normal. Horseshoe configuration of kidneys is noted. No hydronephrosis or renal obstruction is noted. Urinary bladder is unremarkable. Stomach/Bowel: Stomach is unremarkable. Status post appendectomy. There is no evidence of bowel obstruction or inflammation. Vascular/Lymphatic: Aortic atherosclerosis. No enlarged abdominal or pelvic lymph nodes. Reproductive: Stable mild prostatic enlargement. Other: No abdominal wall hernia or abnormality. No abdominopelvic ascites. 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 IMPRESSION: Status post cholecystectomy. 5.8 x 5.2 cm fluid collection is seen in gallbladder fossa most consistent with biloma or less likely abscess given recent surgery. Stable mild intrahepatic and extrahepatic biliary dilatation is noted. These results will be called to the ordering clinician or representative by the Radiologist Assistant, and communication documented in the PACS or zVision Dashboard. Electronically Signed   By: Lupita Raider M.D.   On: 02/28/2023 08:20    Anti-infectives (From admission, onward)    Start     Dose/Rate Route Frequency Ordered Stop   02/28/23 1600  piperacillin-tazobactam (ZOSYN) IVPB 3.375 g        3.375 g 12.5 mL/hr over 240 Minutes Intravenous Every 8 hours 02/28/23 1545 03/07/23 1359       Assessment/Plan S/p Laparoscopic Cholecystectomy 02/16/23 by Dr.  Dwain Sarna - CT w/ 5.8 x 5.2cm fluid collection in the gallbladder fossa.  - Recommend IR evaluation for drainage-they have seen the patient and plan to do this tomorrow. Based on the output, we can determine if we need to have GI evaluate. Will cover with abx for now.   FEN -clears tonight, NPO midnight VTE - SCDs ID -empiric Zosyn 1/29--> Foley - no Dispo - Admit to observation, CCS service.   I reviewed last 24 h vitals and pain scores, last 48 h intake and output, last 24 h labs and trends, and last 24 h imaging results  Berna Bue MD [facility] Surgery 02/28/2023, 4:47 PM Please see Amion for pager number during day hours 7:00am-4:30pm

## 2023-02-28 NOTE — Consult Note (Signed)
Chief Complaint: Patient was seen in consultation today for RUQ pain and abnormal CT results   Procedure: Percutaneous drain placement   Referring Physician(s): Dr. Phylliss Blakes  Supervising Physician: Gilmer Mor  Patient Status: [facility] - In-pt  History of Present Illness: Aaron Stevens is a 68 y.o. male with a history of atherosclerosis, COPD, and right eye cataract who presented to the [facility] ED on 02/28/23 d/t abnormal CT results and RUQ pain s/p cholecystectomy on 02/16/23 with Dr. Dwain Sarna. Patient told RT that he feels they left some of his gallbladder behind. CT A/P 02/28/23 significant for:   IMPRESSION: Status post cholecystectomy. 5.8 x 5.2 cm fluid collection is seen in gallbladder fossa most consistent with biloma or less likely abscess given recent surgery. Stable mild intrahepatic and extrahepatic biliary dilatation is noted.   IR consulted for percutaneous drain placement. Images and case reviewed by Dr. Mosie Epstein.   Patient currently resting in bed. Admits to progressively worsening periumbilical and RUQ pain for the past few days. Otherwise without complaints. Will make NPO at midnight. Not on any blood thinners. Afebrile. Labs pending.   Code Status: Full code  Past Medical History:  Diagnosis Date   Atherosclerosis of artery    Emphysema of lung (HCC)    Pneumonia     Past Surgical History:  Procedure Laterality Date   APPENDECTOMY     CHOLECYSTECTOMY N/A 02/16/2023   Procedure: LAPAROSCOPIC CHOLECYSTECTOMY WITH ICG;  Surgeon: Emelia Loron, MD;  Location: [facility];  Service: General;  Laterality: N/A;   ESOPHAGOGASTRODUODENOSCOPY N/A 02/14/2023   Procedure: ESOPHAGOGASTRODUODENOSCOPY (EGD);  Surgeon: Willis Modena, MD;  Location: [facility];  Service: Gastroenterology;  Laterality: N/A;   EUS Left 02/14/2023   Procedure: UPPER ENDOSCOPIC ULTRASOUND (EUS) LINEAR;  Surgeon: Willis Modena, MD;  Location: [facility];  Service:  Gastroenterology;  Laterality: Left;   IR EXCHANGE BILIARY DRAIN  02/12/2023   IR PERC CHOLECYSTOSTOMY  01/12/2023   IR SINUS/FIST TUBE CHK-NON GI  01/25/2023   KNEE SURGERY      Allergies: Sulfa antibiotics and Bee venom  Medications: Prior to Admission medications   Medication Sig Start Date End Date Taking? Authorizing Provider  acetaminophen (TYLENOL) 650 MG CR tablet Take 1,300 mg by mouth every 8 (eight) hours as needed for pain.    [provider]  albuterol (VENTOLIN HFA) 108 (90 Base) MCG/ACT inhaler Inhale 2 puffs into the lungs every 6 (six) hours as needed for wheezing or shortness of breath. Patient taking differently: Inhale 1 puff into the lungs in the morning and at bedtime. 01/14/23   Rolly Salter, MD  gabapentin (NEURONTIN) 300 MG capsule Take 900 mg by mouth at bedtime.    [provider]  methocarbamol (ROBAXIN) 500 MG tablet Take 1 tablet (500 mg total) by mouth 3 (three) times daily. 02/23/23   Crain, Whitney L, PA  mometasone-formoterol (DULERA) 100-5 MCG/ACT AERO Inhale 2 puffs into the lungs 2 (two) times daily. 02/18/23   Leeroy Bock, MD  nystatin (MYCOSTATIN) 100000 UNIT/ML suspension Take 5 mLs (500,000 Units total) by mouth 4 (four) times daily. Swish for 90 seconds and swallow 02/23/23   Crain, Whitney L, PA  oxyCODONE (OXY IR/ROXICODONE) 5 MG immediate release tablet Take 1 tablet (5 mg total) by mouth every 6 (six) hours as needed for severe pain (pain score 7-10). 02/23/23   Maretta Bees, PA  Spacer/Aero-Holding Chambers DEVI 1 each by Does not apply route as needed. 06/13/22   Kabbe,  Marzella Schlein, NP     No family history on file.  Social History   Socioeconomic History   Marital status: Married    Spouse name: Not on file   Number of children: Not on file   Years of education: Not on file   Highest education level: Not on file  Occupational History   Not on file  Tobacco Use   Smoking status: Former    Types: Cigarettes    Smokeless tobacco: Never   Tobacco comments:    Quit Feb 10, 2023  Vaping Use   Vaping status: Never Used  Substance and Sexual Activity   Alcohol use: No   Drug use: No   Sexual activity: Not Currently    Partners: Female  Other Topics Concern   Not on file  Social History Narrative   ** Merged History Encounter **       Social Drivers of Health   Financial Resource Strain: Not on file  Food Insecurity: No Food Insecurity (02/11/2023)   Hunger Vital Sign    Worried About Running Out of Food in the Last Year: Never true    Ran Out of Food in the Last Year: Never true  Transportation Needs: No Transportation Needs (02/11/2023)   PRAPARE - Administrator, Civil Service (Medical): No    Lack of Transportation (Non-Medical): No  Physical Activity: Not on file  Stress: Not on file  Social Connections: Moderately Integrated (02/11/2023)   Social Connection and Isolation Panel [NHANES]    Frequency of Communication with Friends and Family: More than three times a week    Frequency of Social Gatherings with Friends and Family: Twice a week    Attends Religious Services: 1 to 4 times per year    Active Member of [employer] or Organizations: No    Attends Banker Meetings: Never    Marital Status: Married    Review of Systems  Constitutional:  Negative for chills and fever.  Respiratory:  Negative for shortness of breath.   Cardiovascular:  Negative for chest pain.  Gastrointestinal:  Positive for abdominal pain (RUQ). Negative for nausea and vomiting.  Musculoskeletal:  Negative for myalgias.  Neurological:  Negative for dizziness.  Denies any N/V, chest pain, shortness of breath, fevers/chills. All other ROS negative.  Vital Signs: BP 136/63 (BP Location: Left Arm)   Pulse 80   Temp 98.2 F (36.8 C) (Oral)   Resp 18   Ht 5\' 10"  (1.778 m)   Wt 110 lb 11.2 oz (50.2 kg)   SpO2 98%   BMI 15.88 kg/m    Physical Exam Vitals reviewed.   Constitutional:      Appearance: Normal appearance.  HENT:     Head: Normocephalic and atraumatic.     Mouth/Throat:     Mouth: Mucous membranes are moist.     Pharynx: Oropharynx is clear.  Cardiovascular:     Rate and Rhythm: Normal rate and regular rhythm.     Heart sounds: Normal heart sounds.  Pulmonary:     Effort: Pulmonary effort is normal.     Breath sounds: Wheezing (scattered throughout) present.  Abdominal:     General: Abdomen is flat.     Palpations: Abdomen is soft.     Tenderness: There is abdominal tenderness (periumbilical and RUQ).  Musculoskeletal:        General: Normal range of motion.     Cervical back: Normal range of motion.  Skin:    General:  Skin is warm and dry.  Neurological:     General: No focal deficit present.     Mental Status: He is alert and oriented to person, place, and time. Mental status is at baseline.  Psychiatric:        Mood and Affect: Mood normal.        Behavior: Behavior normal.        Judgment: Judgment normal.     Imaging: CT ABDOMEN PELVIS W CONTRAST Result Date: 02/28/2023 CLINICAL DATA:  Right upper quadrant abdominal pain status post cholecystectomy 2 weeks ago. EXAM: CT ABDOMEN AND PELVIS WITH CONTRAST TECHNIQUE: Multidetector CT imaging of the abdomen and pelvis was performed using the standard protocol following bolus administration of intravenous contrast. RADIATION DOSE REDUCTION: This exam was performed according to the departmental dose-optimization program which includes automated exposure control, adjustment of the mA and/or kV according to patient size and/or use of iterative reconstruction technique. CONTRAST:  OMNIPAQUE IOHEXOL 300 MG/ML  SOLN COMPARISON:  February 13, 2023. FINDINGS: Lower chest: No acute abnormality. Hepatobiliary: Stable hepatic cysts. Status post cholecystectomy. 5.8 x 5.2 cm fluid collection is seen in gallbladder fossa most consistent with biloma or less likely abscess given recent  surgical history. Mild intrahepatic and extrahepatic biliary dilatation is noted most consistent with post cholecystectomy status. Pancreas: Unremarkable. No pancreatic ductal dilatation or surrounding inflammatory changes. Spleen: Normal in size without focal abnormality. Adrenals/Urinary Tract: Adrenal glands appear normal. Horseshoe configuration of kidneys is noted. No hydronephrosis or renal obstruction is noted. Urinary bladder is unremarkable. Stomach/Bowel: Stomach is unremarkable. Status post appendectomy. There is no evidence of bowel obstruction or inflammation. Vascular/Lymphatic: Aortic atherosclerosis. No enlarged abdominal or pelvic lymph nodes. Reproductive: Stable mild prostatic enlargement. Other: No abdominal wall hernia or abnormality. No abdominopelvic ascites. 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 IMPRESSION: Status post cholecystectomy. 5.8 x 5.2 cm fluid collection is seen in gallbladder fossa most consistent with biloma or less likely abscess given recent surgery. Stable mild intrahepatic and extrahepatic biliary dilatation is noted. These results will be called to the ordering clinician or representative by the Radiologist Assistant, and communication documented in the PACS or zVision Dashboard. Electronically Signed   By: Lupita Raider M.D.   On: 02/28/2023 08:20   US Abdomen Complete Result Date: 02/23/2023 CLINICAL DATA:  Abdominal pain. Recent cholecystectomy. Palpable abnormality in paraumbilical region. EXAM: ABDOMEN ULTRASOUND COMPLETE COMPARISON:  CT on 02/01/2023 FINDINGS: Gallbladder: Surgically absent. A complex lesion is seen in the gallbladder fossa which measures approximately 6.4 x 5.4 x 5.7 cm. This is suspicious for a complex postop fluid collection such as a biloma or seroma. Common bile duct: Diameter: 5 mm, within normal limits. Liver: A small septated cyst is seen in the left hepatic lobe measuring 1.7 cm. No  hepatic mass identified. Within normal limits in parenchymal echogenicity. Portal vein is patent on color Doppler imaging with normal direction of blood flow towards the liver. IVC: No abnormality visualized. Pancreas: Not well visualized due to overlying bowel gas. Spleen: Size and appearance within normal limits. 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 Echogenicity within normal limits. No mass or hydronephrosis visualized. Left Kidney: Horseshoe kidney, better demonstrated on recent CT. Echogenicity within normal limits. No mass or hydronephrosis visualized. Abdominal aorta: Not well visualized due to overlying bowel gas. Other findings: Edema is seen in the paraumbilical abdominal wall soft tissues in the area of clinical concern, however, no definite fluid collection, mass, or  hernia is seen at this site. IMPRESSION: 6.4 cm complex lesion in gallbladder fossa, suspicious for a complex postop fluid collection such as a biloma or seroma. No evidence of biliary ductal dilatation. Edema in paraumbilical anterior abdominal wall soft tissues, without definite fluid collection, mass, or hernia. Horseshoe kidney. Electronically Signed   By: Danae Orleans M.D.   On: 02/23/2023 18:59   CT ABDOMEN PELVIS W CONTRAST Result Date: 02/13/2023 CLINICAL DATA:  Abdominal pain, acute, nonlocalized EXAM: CT ABDOMEN AND PELVIS WITH CONTRAST TECHNIQUE: Multidetector CT imaging of the abdomen and pelvis was performed using the standard protocol following bolus administration of intravenous contrast. RADIATION DOSE REDUCTION: This exam was performed according to the departmental dose-optimization program which includes automated exposure control, adjustment of the mA and/or kV according to patient size and/or use of iterative reconstruction technique. CONTRAST:  OMNIPAQUE IOHEXOL 300 MG/ML  SOLN COMPARISON:  CT abdomen pelvis 02/09/2022, MR abdomen 01/10/2023 FINDINGS: Lower chest: Severe  atherosclerotic plaque.  Tiny hiatal hernia. Hepatobiliary: Subcentimeter hypodensities too small to characterize. Fluid density lesion within left hepatic lobe likely represents a simple hepatic cyst. Cholecystostomy tube with tip within the gallbladder lumen. Adenomyomatosis. Persistent gallbladder wall thickening and pericholecystic fluid. Persistent mild intrahepatic and extrahepatic biliary dilatation. Pancreas: No focal lesion. Normal pancreatic contour. No surrounding inflammatory changes. No main pancreatic ductal dilatation. Spleen: Normal in size without focal abnormality. Adrenals/Urinary Tract: Horseshoe kidney again noted. No adrenal nodule bilaterally. Bilateral kidneys enhance symmetrically. No hydronephrosis. No hydroureter. The urinary bladder is unremarkable. Stomach/Bowel: Stomach is within normal limits. No evidence of bowel wall thickening or dilatation. The appendix is not definitely identified with no inflammatory changes in the right lower quadrant to suggest acute appendicitis. Vascular/Lymphatic: No abdominal aorta or iliac aneurysm. Severe atherosclerotic plaque of the aorta and its branches. No abdominal, pelvic, or inguinal lymphadenopathy. Reproductive: Prostate is enlarged measuring up to 4.8 cm. Other: No intraperitoneal free fluid. No intraperitoneal free gas. No organized fluid collection. Musculoskeletal: No abdominal wall hernia or abnormality. No suspicious lytic or blastic osseous lesions. No acute displaced fracture. 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 IMPRESSION: 1. Cholecystostomy in appropriate position with persistent gallbladder wall thickening and pericholecystic fluid. Persistent mild intrahepatic and extrahepatic biliary dilatation. Adenomyomatosis. 2. Tiny hiatal hernia. 3. Prostatomegaly. 4. Horseshoe kidney. 5.  Aortic Atherosclerosis (ICD10-I70.0). Electronically Signed   By: Tish Frederickson M.D.   On: 02/13/2023  20:37   IR EXCHANGE BILIARY DRAIN Result Date: 02/12/2023 INDICATION: Calculus cholecystitis, painful cholecystostomy tube with daily flushing EXAM: FLUOROSCOPIC EXCHANGE OF THE 10 FRENCH CHOLECYSTOSTOMY MEDICATIONS: 1% lidocaine local ANESTHESIA/SEDATION: None. FLUOROSCOPY: Radiation Exposure Index (as provided by the fluoroscopic device): 4.0 mGy Kerma COMPLICATIONS: None immediate. PROCEDURE: Informed written consent was obtained from the patient after a thorough discussion of the procedural risks, benefits and alternatives. All questions were addressed. Maximal Sterile Barrier Technique was utilized including caps, mask, sterile gowns, sterile gloves, sterile drape, hand hygiene and skin antiseptic. A timeout was performed prior to the initiation of the procedure. Under sterile conditions and local anesthesia, the existing cholecystostomy was injected with contrast confirming position in the gallbladder fundus. Sludge and stones noted within the gallbladder. Cystic duct is obstructed. Catheter successfully exchanged over a Amplatz guidewire. Contrast injection reconfirms position. Catheter secured with a silk suture and connected to external gravity drainage bag. Sterile dressing applied. No immediate complication. Patient tolerated the procedure well. IMPRESSION: Successful fluoroscopic exchange of the 10 [place] cholecystostomy Electronically Signed  By: Osvaldo Shipper M.D.   On: 02/12/2023 16:49   CT ABDOMEN PELVIS W CONTRAST Result Date: 02/10/2023 CLINICAL DATA:  Worsening right abdominal pain. Acute cholecystitis. Percutaneous cholecystostomy. EXAM: CT ABDOMEN AND PELVIS WITH CONTRAST TECHNIQUE: Multidetector CT imaging of the abdomen and pelvis was performed using the standard protocol following bolus administration of intravenous contrast. RADIATION DOSE REDUCTION: This exam was performed according to the departmental dose-optimization program which includes automated exposure control, adjustment of the  mA and/or kV according to patient size and/or use of iterative reconstruction technique. CONTRAST:  OMNIPAQUE IOHEXOL 300 MG/ML  SOLN COMPARISON:  01/09/2023 FINDINGS: Lower Chest: No acute findings. Hepatobiliary: Several tiny hepatic cysts remains stable. No evidence of hepatic mass or abscess. Percutaneous cholecystostomy tube is seen in appropriate position. Changes of acute cholecystitis show no significant change mild diffuse biliary ductal dilatation remains stable. Pancreas: No mass or inflammatory changes. No evidence of pancreatic ductal dilatation. Spleen: Within normal limits in size and appearance. Adrenals/Urinary Tract: Horseshoe kidney again noted. No suspicious masses identified. No evidence of ureteral calculi or hydronephrosis. Unremarkable unopacified urinary bladder. Stomach/Bowel: No evidence of obstruction, inflammatory process or abnormal fluid collections. Vascular/Lymphatic: No pathologically enlarged lymph nodes. No acute vascular findings. Reproductive:  Stable mildly enlarged prostate. Other:  None. Musculoskeletal: No suspicious bone lesions identified. 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 IMPRESSION: Percutaneous cholecystostomy tube in appropriate position. Acute cholecystitis, otherwise not significantly changed in appearance. Stable mild diffuse biliary ductal dilatation. Horseshoe kidney. Stable mildly enlarged prostate. Electronically Signed   By: Danae Orleans M.D.   On: 02/10/2023 15:39    Labs:  CBC: Recent Labs    02/16/23 0508 02/17/23 0532 02/18/23 0501 02/23/23 0945  WBC 11.1* 15.0* 11.5* 13.6*  HGB 12.2* 11.9* 11.4* 12.0*  HCT 37.1* 35.8* 35.1* 37.5*  PLT 274 359 351 694.0*    COAGS: No results for input(s): "INR", "APTT" in the last 8760 hours.  BMP: Recent Labs    02/14/23 0453 02/16/23 0508 02/17/23 0532 02/18/23 0501 02/23/23 0945  NA 135 139 136 135 140  K 4.2 4.0 4.3 4.3 4.7  CL  101 103 101 101 102  CO2 23 24 29 28 29   GLUCOSE 91 95 124* 104* 81  BUN 16 13 18 13 15   CALCIUM 9.0 9.1 8.9 8.8* 9.0  CREATININE 1.08 0.85 1.02 0.89 0.73  GFRNONAA >60 >60 >60 >60  --     LIVER FUNCTION TESTS: Recent Labs    02/16/23 0508 02/17/23 0532 02/18/23 0501 02/23/23 0945  BILITOT 0.6 0.7 0.6 0.4  AST 16 30 22 15   ALT 16 20 16 13   ALKPHOS 72 70 62 93  PROT 6.8 6.6 6.3* 6.2  ALBUMIN 3.2* 2.9* 3.0* 3.7    TUMOR MARKERS: No results for input(s): "AFPTM", "CEA", "CA199", "CHROMGRNA" in the last 8760 hours.  Assessment and Plan:  68 y.o. male with a history of atherosclerosis, COPD, and right eye cataract who presented to the [facility] ED on 02/28/23 d/t abnormal CT results and RUQ pain s/p cholecystectomy on 02/16/23 with Dr. Dwain Sarna. Patient told RT that he feels they left some of his gallbladder behind. CT A/P 02/28/23 concerning for fluid collection either biloma vs abscess in gallbladder fossa measuring 5.8 x 5.2cm. IR consulted for percutaneous drain placement.  -Will make NPO at midnight -Labs pending -Not on [facility] -Plan for percutaneous drain placement on 03/01/23  Risks and benefits discussed with the patient including bleeding,  infection, damage to adjacent structures, bowel perforation/fistula connection, and sepsis.  All of the patient's questions were answered, patient is agreeable to proceed.  Consent signed and in APP office.  Thank you for this interesting consult. I greatly enjoyed meeting Dorean Daniello and look forward to participating in their care. A copy of this report was sent to the requesting provider on this date.  Electronically Signed: Jama Flavors, PA-C 02/28/2023, 4:14 PM   I spent a total of 40 Minutes in face to face clinical consultation, greater than 50% of which was counseling/coordinating care for percutaneous drain placement.

## 2023-03-01 ENCOUNTER — Observation Stay (HOSPITAL_COMMUNITY): Payer: Medicare Other

## 2023-03-01 DIAGNOSIS — K651 Peritoneal abscess: Secondary | ICD-10-CM | POA: Diagnosis present

## 2023-03-01 DIAGNOSIS — R188 Other ascites: Secondary | ICD-10-CM | POA: Diagnosis present

## 2023-03-01 DIAGNOSIS — Z91030 Bee allergy status: Secondary | ICD-10-CM | POA: Diagnosis not present

## 2023-03-01 DIAGNOSIS — Z79899 Other long term (current) drug therapy: Secondary | ICD-10-CM | POA: Diagnosis not present

## 2023-03-01 DIAGNOSIS — Z882 Allergy status to sulfonamides status: Secondary | ICD-10-CM | POA: Diagnosis not present

## 2023-03-01 DIAGNOSIS — J439 Emphysema, unspecified: Secondary | ICD-10-CM | POA: Diagnosis present

## 2023-03-01 DIAGNOSIS — T8143XA Infection following a procedure, organ and space surgical site, initial encounter: Secondary | ICD-10-CM | POA: Diagnosis present

## 2023-03-01 DIAGNOSIS — Z87891 Personal history of nicotine dependence: Secondary | ICD-10-CM | POA: Diagnosis not present

## 2023-03-01 DIAGNOSIS — Z7951 Long term (current) use of inhaled steroids: Secondary | ICD-10-CM | POA: Diagnosis not present

## 2023-03-01 LAB — BASIC METABOLIC PANEL
Anion gap: 7 (ref 5–15)
BUN: 9 mg/dL (ref 8–23)
CO2: 24 mmol/L (ref 22–32)
Calcium: 8.4 mg/dL — ABNORMAL LOW (ref 8.9–10.3)
Chloride: 106 mmol/L (ref 98–111)
Creatinine, Ser: 0.7 mg/dL (ref 0.61–1.24)
GFR, Estimated: >60 mL/min (ref >60)
Glucose, Bld: 90 mg/dL (ref 70–99)
Potassium: 3.9 mmol/L (ref 3.5–5.1)
Sodium: 137 mmol/L (ref 135–145)

## 2023-03-01 LAB — CBC
HCT: 33 % — ABNORMAL LOW (ref 39.0–52.0)
Hemoglobin: 10.6 g/dL — ABNORMAL LOW (ref 13.0–17.0)
MCH: 30.6 pg (ref 26.0–34.0)
MCHC: 32.1 g/dL (ref 30.0–36.0)
MCV: 95.4 fL (ref 80.0–100.0)
Platelets: 390 10*3/uL (ref 150–400)
RBC: 3.46 MIL/uL — ABNORMAL LOW (ref 4.22–5.81)
RDW: 14.8 % (ref 11.5–15.5)
WBC: 10.7 10*3/uL — ABNORMAL HIGH (ref 4.0–10.5)
nRBC: 0 % (ref 0.0–0.2)

## 2023-03-01 LAB — MAGNESIUM: Magnesium: 2 mg/dL (ref 1.7–2.4)

## 2023-03-01 MED ORDER — FENTANYL CITRATE (PF) 100 MCG/2ML IJ SOLN
INTRAMUSCULAR | Status: AC
  Filled 2023-03-01: qty 2

## 2023-03-01 MED ORDER — ENOXAPARIN SODIUM 40 MG/0.4ML IJ SOSY
40.0000 mg | PREFILLED_SYRINGE | INTRAMUSCULAR | Status: DC
  Administered 2023-03-01 – 2023-03-02 (×2): 40 mg via SUBCUTANEOUS
  Filled 2023-03-01 (×2): qty 0.4

## 2023-03-01 MED ORDER — MIDAZOLAM HCL 2 MG/2ML IJ SOLN
INTRAMUSCULAR | Status: AC | PRN
  Administered 2023-03-01: .5 mg via INTRAVENOUS
  Administered 2023-03-01: 1 mg via INTRAVENOUS
  Administered 2023-03-01: .5 mg via INTRAVENOUS

## 2023-03-01 MED ORDER — MIDAZOLAM HCL 2 MG/2ML IJ SOLN
INTRAMUSCULAR | Status: AC
  Filled 2023-03-01: qty 2

## 2023-03-01 MED ORDER — ONDANSETRON HCL 4 MG/2ML IJ SOLN
INTRAMUSCULAR | Status: AC
  Filled 2023-03-01: qty 2

## 2023-03-01 MED ORDER — SODIUM CHLORIDE 0.9% FLUSH
5.0000 mL | Freq: Three times a day (TID) | INTRAVENOUS | Status: DC
  Administered 2023-03-01 – 2023-03-02 (×4): 5 mL

## 2023-03-01 MED ORDER — FENTANYL CITRATE (PF) 100 MCG/2ML IJ SOLN
INTRAMUSCULAR | Status: AC | PRN
  Administered 2023-03-01 (×2): 25 ug via INTRAVENOUS
  Administered 2023-03-01: 50 ug via INTRAVENOUS

## 2023-03-01 NOTE — Plan of Care (Signed)

## 2023-03-01 NOTE — Progress Notes (Signed)
Progress Note     Subjective: Just got back from IR and still a bit groggy. Wife is at bedside. He denies pain, nausea, vomiting   Objective: Vital signs in last 24 hours: Temp:  [98.2 F (36.8 C)-98.6 F (37 C)] 98.5 F (36.9 C) (01/30 0616) Pulse Rate:  [66-82] 66 (01/30 0616) Resp:  [16-18] 16 (01/30 0616) BP: (123-142)/(63-71) 124/65 (01/30 0616) SpO2:  [97 %-100 %] 97 % (01/30 0616) Weight:  [50.2 kg] 50.2 kg (01/29 1107) Last BM Date : 02/28/23  Intake/Output from previous day: 01/29 0701 - 01/30 0700 In: 1158.7 [I.V.:1158.7] Out: -  Intake/Output this shift: No intake/output data recorded.  PE: General: pleasant, WD, male who is laying in bed in NAD Lungs:   Respiratory effort nonlabored Abd: soft, ND, very minimal TTP around drain which has blood tinged grey fluid in bulb MSK: all 4 extremities are symmetrical with no cyanosis, clubbing, or edema. Skin: warm and dry Psych: A&Ox3 with an appropriate affect.    Lab Results:  Recent Labs    02/28/23 1724 03/01/23 0350  WBC 12.7* 10.7*  HGB 11.5* 10.6*  HCT 36.5* 33.0*  PLT 748* 390   BMET Recent Labs    02/28/23 1724 03/01/23 0350  NA 140 137  K 4.1 3.9  CL 105 106  CO2 27 24  GLUCOSE 94 90  BUN 9 9  CREATININE 0.74 0.70  CALCIUM 9.1 8.4*   PT/INR Recent Labs    02/28/23 1724  LABPROT 14.3  INR 1.1   CMP     Component Value Date/Time   NA 137 03/01/2023 0350   K 3.9 03/01/2023 0350   CL 106 03/01/2023 0350   CO2 24 03/01/2023 0350   GLUCOSE 90 03/01/2023 0350   BUN 9 03/01/2023 0350   CREATININE 0.70 03/01/2023 0350   CREATININE 0.79 01/26/2023 1435   CALCIUM 8.4 (L) 03/01/2023 0350   PROT 6.7 02/28/2023 1724   ALBUMIN 3.2 (L) 02/28/2023 1724   AST 16 02/28/2023 1724   ALT 13 02/28/2023 1724   ALKPHOS 97 02/28/2023 1724   BILITOT 0.4 02/28/2023 1724   GFRNONAA >60 03/01/2023 0350   GFRAA >60 06/11/2018 2016   Lipase     Component Value Date/Time   LIPASE 28 01/09/2023  1543       Studies/Results: CT ABDOMEN PELVIS W CONTRAST Result Date: 02/28/2023 CLINICAL DATA:  Right upper quadrant abdominal pain status post cholecystectomy 2 weeks ago. EXAM: CT ABDOMEN AND PELVIS WITH CONTRAST TECHNIQUE: Multidetector CT imaging of the abdomen and pelvis was performed using the standard protocol following bolus administration of intravenous contrast. RADIATION DOSE REDUCTION: This exam was performed according to the departmental dose-optimization program which includes automated exposure control, adjustment of the mA and/or kV according to patient size and/or use of iterative reconstruction technique. CONTRAST:  OMNIPAQUE IOHEXOL 300 MG/ML  SOLN COMPARISON:  February 13, 2023. FINDINGS: Lower chest: No acute abnormality. Hepatobiliary: Stable hepatic cysts. Status post cholecystectomy. 5.8 x 5.2 cm fluid collection is seen in gallbladder fossa most consistent with biloma or less likely abscess given recent surgical history. Mild intrahepatic and extrahepatic biliary dilatation is noted most consistent with post cholecystectomy status. Pancreas: Unremarkable. No pancreatic ductal dilatation or surrounding inflammatory changes. Spleen: Normal in size without focal abnormality. Adrenals/Urinary Tract: Adrenal glands appear normal. Horseshoe configuration of kidneys is noted. No hydronephrosis or renal obstruction is noted. Urinary bladder is unremarkable. Stomach/Bowel: Stomach is unremarkable. Status post appendectomy. There is no evidence of  bowel obstruction or inflammation. Vascular/Lymphatic: Aortic atherosclerosis. No enlarged abdominal or pelvic lymph nodes. Reproductive: Stable mild prostatic enlargement. Other: No abdominal wall hernia or abnormality. No abdominopelvic ascites. 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 IMPRESSION: Status post cholecystectomy. 5.8 x 5.2 cm fluid collection is seen in gallbladder fossa most  consistent with biloma or less likely abscess given recent surgery. Stable mild intrahepatic and extrahepatic biliary dilatation is noted. These results will be called to the ordering clinician or representative by the Radiologist Assistant, and communication documented in the PACS or zVision Dashboard. Electronically Signed   By: Lupita Raider M.D.   On: 02/28/2023 08:20    Anti-infectives: Anti-infectives (From admission, onward)    Start     Dose/Rate Route Frequency Ordered Stop   02/28/23 1600  piperacillin-tazobactam (ZOSYN) IVPB 3.375 g        3.375 g 12.5 mL/hr over 240 Minutes Intravenous Every 8 hours 02/28/23 1545 03/07/23 1359        Assessment/Plan S/p Laparoscopic Cholecystectomy 02/16/23 by Dr. Dwain Sarna - CT w/ 5.8 x 5.2cm fluid collection in the gallbladder fossa.  - s/p IR drain 1/30 trend output and follow culture - [facility] - continue IV abx   FEN - [facility] VTE - SCDs, lovenox ID -empiric Zosyn 1/29-->   LOS: 0 days   Eric Form, [facility] Surgery 03/01/2023, 8:20 AM Please see Amion for pager number during day hours 7:00am-4:30pm

## 2023-03-01 NOTE — Procedures (Signed)
Vascular and Interventional Radiology Procedure Note  Patient: Aaron Stevens DOB: 09-05-1955 Medical Record Number: [id number] Note Date/Time: 03/01/23 9:27 AM   Performing Physician: Roanna Banning, MD Assistant(s): None  Diagnosis: Post cholecystectomy w GB fossa collection  Procedure: DRAINAGE CATHETER PLACEMENT into a  GALLBLADDER FOSSA COLLECTION  Anesthesia: Conscious Sedation Complications: None Estimated Blood Loss: Minimal Specimens: Sent for Gram Stain, Aerobe Culture, and Anerobe Culture  Findings:  Successful CT-guided placement of 12 F catheter into GB fossa collection.  Plan:  - Flush drain with 5 mL Normal Saline every 8 hours. - Follow up drain evaluation / sinogram in 10 day(s).  See detailed procedure note with images in PACS. The patient tolerated the procedure well without incident or complication and was returned to Floor Bed in stable condition.    Roanna Banning, MD Vascular and Interventional Radiology [facility] Rad[facility]   Pager. [phone] Clinic. [phone]

## 2023-03-02 ENCOUNTER — Other Ambulatory Visit (HOSPITAL_COMMUNITY): Payer: Medicare Other

## 2023-03-02 ENCOUNTER — Other Ambulatory Visit (HOSPITAL_COMMUNITY): Payer: Self-pay

## 2023-03-02 LAB — CBC
HCT: 35.4 % — ABNORMAL LOW (ref 39.0–52.0)
Hemoglobin: 11.3 g/dL — ABNORMAL LOW (ref 13.0–17.0)
MCH: 30.9 pg (ref 26.0–34.0)
MCHC: 31.9 g/dL (ref 30.0–36.0)
MCV: 96.7 fL (ref 80.0–100.0)
Platelets: 525 10*3/uL — ABNORMAL HIGH (ref 150–400)
RBC: 3.66 MIL/uL — ABNORMAL LOW (ref 4.22–5.81)
RDW: 14.6 % (ref 11.5–15.5)
WBC: 11.2 10*3/uL — ABNORMAL HIGH (ref 4.0–10.5)
nRBC: 0 % (ref 0.0–0.2)

## 2023-03-02 LAB — COMPREHENSIVE METABOLIC PANEL
ALT: 13 U/L (ref 0–44)
AST: 17 U/L (ref 15–41)
Albumin: 2.8 g/dL — ABNORMAL LOW (ref 3.5–5.0)
Alkaline Phosphatase: 88 U/L (ref 38–126)
Anion gap: 9 (ref 5–15)
BUN: 13 mg/dL (ref 8–23)
CO2: 22 mmol/L (ref 22–32)
Calcium: 8.6 mg/dL — ABNORMAL LOW (ref 8.9–10.3)
Chloride: 105 mmol/L (ref 98–111)
Creatinine, Ser: 0.81 mg/dL (ref 0.61–1.24)
GFR, Estimated: >60 mL/min (ref >60)
Glucose, Bld: 90 mg/dL (ref 70–99)
Potassium: 4.3 mmol/L (ref 3.5–5.1)
Sodium: 136 mmol/L (ref 135–145)
Total Bilirubin: 0.8 mg/dL (ref 0.0–1.2)
Total Protein: 6.2 g/dL — ABNORMAL LOW (ref 6.5–8.1)

## 2023-03-02 MED ORDER — SODIUM CHLORIDE 0.9% FLUSH
5.0000 mL | Freq: Two times a day (BID) | INTRAVENOUS | 0 refills | Status: AC
  Filled 2023-03-02: qty 100, 10d supply, fill #0

## 2023-03-02 MED ORDER — AMOXICILLIN-POT CLAVULANATE 875-125 MG PO TABS
1.0000 | ORAL_TABLET | Freq: Two times a day (BID) | ORAL | 0 refills | Status: AC
  Filled 2023-03-02: qty 10, 5d supply, fill #0

## 2023-03-02 NOTE — Discharge Summary (Signed)
Patient ID: Evon Lopezperez [id number] Jan 31, 1955 68 y.o.  Admit date: 02/28/2023 Discharge date: 03/02/2023  Admitting Diagnosis: Gallbladder fossa fluid collection, s/p lap chole  Discharge Diagnosis Patient Active Problem List   Diagnosis Date Noted   Intraabdominal fluid collection 03/01/2023   Postprocedural intraabdominal abscess (HCC) 02/28/2023   Hypocalcemia 02/23/2023   Umbilical mass 02/23/2023   Thrush of mouth and esophagus (HCC) 02/23/2023   Umbilical pain 02/23/2023   Cholecystitis 02/10/2023   Normocytic anemia 02/10/2023   Cholelithiasis 02/10/2023   RUQ abdominal pain 01/26/2023   Screening cholesterol level 01/26/2023   Weight loss, unintentional 01/26/2023   Other specified diseases of liver 01/26/2023   Clubbing of nail 01/26/2023   Chronic foot pain, right 01/26/2023   Choledocholithiasis with acute cholecystitis 01/09/2023   Tobacco use 01/09/2023   Emphysema of lung [facility])    Nuclear sclerotic cataract of right eye 05/12/2014    Consultants IR  Reason for Admission: Jerrik Housholder is a 68 y.o. male who underwent percutaneous cholecystostomy drain on 01/12/2023. He underwent laparoscopic cholecystectomy on 02/16/2023 by Dr. Dwain Sarna. He found that the gallbladder sat on the common bile duct. The gallbladder was divided and some stones were evacuated. Endoloop was used and gallbladder was reconstituted on the cystic duct. He was discharged on postop day 2.  He followed up with his PCP on 02/23/2023 who ordered a right upper quadrant ultrasound due to abdominal pain and swelling around the umbilicus. Ultrasound showed a 6.4 cm complex lesion in gallbladder fossa, suspicious for biloma versus seroma. He is was seen on 1/27 in the office for a recheck.   A CT scan was obtained and showed a 5.8 x 5.2 cm fluid collection is seen in the gallbladder fossa. He presented to the ED for evaluation.   He reports ongoing right sided abdominal pain. He  denies fever, nausea, vomiting. Denies issues with his bowel movements or urination.   Procedures Percutaneous drain placement, IR 1/30  Hospital Course:  The patient was admitted and started on zosyn given a mild leukocytosis.  IR was consulted and a drain was placed.  There was some noted bilious looking fluid with placement, but since that time there has been 65cc of output and it has been serous with no evidence of bile.  The patient really wants to go home.  His culture is pending, but gram stain right now shows no growth, but some gram + cocci.  He will be discharged with 5 more days of oral abx therapy.  He has follow up in our clinic in 1 week to check the output from his JP drain.  He also has follow up with IR for further imaging and drain management.  He was otherwise felt stable for DC home at this time.  Physical Exam: Gen: NAD Abd: soft, NT, ND, incisions are healing well with some steri-strips in place.  JP drain in place with minimal serous output.  Allergies as of 03/02/2023       Reactions   Bee Venom Anaphylaxis   Sulfa Antibiotics Anaphylaxis, Other (See Comments)        Medication List     TAKE these medications    albuterol 108 (90 Base) MCG/ACT inhaler Commonly known as: VENTOLIN HFA Inhale 2 puffs into the lungs every 6 (six) hours as needed for wheezing or shortness of breath. What changed:  how much to take when to take this   amoxicillin-clavulanate 875-125 MG tablet Commonly known as: AUGMENTIN Take  1 tablet by mouth 2 (two) times daily for 5 days.   gabapentin 300 MG capsule Commonly known as: NEURONTIN Take 600 mg by mouth at bedtime.   levETIRAcetam 250 MG tablet Commonly known as: KEPPRA Take 250 mg by mouth 2 (two) times daily.   methocarbamol 500 MG tablet Commonly known as: ROBAXIN Take 1 tablet (500 mg total) by mouth 3 (three) times daily. What changed:  when to take this reasons to take this   mometasone-formoterol 100-5 MCG/ACT  Aero Commonly known as: DULERA Inhale 2 puffs into the lungs 2 (two) times daily.   nystatin 100000 UNIT/ML suspension Commonly known as: MYCOSTATIN Take 5 mLs (500,000 Units total) by mouth 4 (four) times daily. Swish for 90 seconds and swallow   oxyCODONE 5 MG immediate release tablet Commonly known as: Oxy IR/ROXICODONE Take 1 tablet (5 mg total) by mouth every 6 (six) hours as needed for severe pain (pain score 7-10). What changed: when to take this   sodium chloride flush 0.9 % Soln Commonly known as: NS 5 mLs by Intracatheter route every 12 (twelve) hours.          Follow-up Information     Maczis, Hedda Slade, PA-C Follow up on 03/08/2023.   Specialty: General Surgery Why: 10:00am, Arrive 15 minutes prior to your appointment time, Please bring your insurance card and photo ID Contact information: [address] [place] [address] [facility] [place] [address] [phone]                 Signed: Barnetta Chapel, [facility] Surgery 03/02/2023, 12:26 PM Please see Amion for pager number during day hours 7:00am-4:30pm, 7-11:30am on Weekends

## 2023-03-02 NOTE — Discharge Instructions (Signed)
 Interventional Radiology Percutaneous Abscess Drain Placement After Care   This sheet gives you information about how to care for yourself after your procedure. Your health care provider may also give you more specific instructions. Your drain was placed by an interventional radiologist with [facility]. If you have questions or concerns, contact [facility] Radiology at [phone].   What is a percutaneous drain?   A drain is a small plastic tube (catheter) that goes into the fluid collection in your body through your skin.   How long will I need the drain?   How long the drain needs to stay in is determined by where the drain is, how much comes out of the drain each day and if you are having any other surgical procedures.   Interventional radiology will determine when it is time to remove the drain. It is important to follow up as directed so that the drain can be removed as soon as it is safe to do so.   What can I expect after the procedure?   After the procedure, it is common to have:   A small amount of bruising and discomfort in the area where the drainage tube (catheter) was placed.   Sleepiness and fatigue. This should go away after the medicines you were given have worn off.   Follow these instructions at home:   Insertion site care   Check your insertion site when you change the bandage. Check for:   More redness, swelling, or pain.   More fluid or blood.   Warmth.   Pus or a bad smell.   When caring for your insertion site:   Wash your hands with soap and water for at least 20 seconds before and after you change your bandage (dressing). If soap and water are not available, use hand sanitizer.   You do not need to change your dressing everyday if it is clean and dry. Change your dressing every 3 days or as needed when it is soiled, wet or becoming dislodged. You will need to change your dressing each time you shower.   Leave stitches (sutures), skin  glue, or adhesive strips in place. These skin closures may need to stay in place for 2 weeks or longer. If adhesive strip edges start to loosen and curl up, you may trim the loose edges. Do not remove adhesive strips completely unless your health care provider tells you to do so.   Catheter care   Flush the catheter once per day with 5 mL of 0.9% normal saline unless you are told otherwise by your healthcare provider. This helps to prevent clogs in the catheter.   To disconnect the drain, turn the clear plastic tube to the left. Attach the saline syringe by placing it on the white end of the drain and turning gently to the right. Once attached gently push the plunger to the 5 mL mark. After you are done flushing, disconnect the syringe by turning to the left and reattach your drainage container   If you have a bulb please be sure the bulb is charged after reconnecting it - to do this pinch the bulb between your thumb and first finger and close the stopper located on the top of the bulb.    Check for fluid leaking from around your catheter (instead of fluid draining through your catheter). This may be a sign that the drain is no longer working correctly.   Write down the following information every time you empty your  bag:   The date and time.   The amount of drainage.   Activity   Rest at home for 1-2 days after your procedure.   For the first 48 hours do not lift anything more than 10 lbs (about a gallon of milk). You may perform moderate activities/exercise. Please avoid strenuous activities during this time.   Avoid any activities which may pull on your drain as this can cause your drain to become dislodged.   If you were given a sedative during the procedure, it can affect you for several hours. Do not drive or operate machinery until your health care provider says that it is safe.   General instructions   For mild pain take over-the-counter medications as needed for pain such as  Tylenol or Advil. If you are experiencing severe pain please call our office as this may indicate an issue with your drain.    If you were prescribed an antibiotic medicine, take it as told by your health care provider. Do not stop using the antibiotic even if you start to feel better.   You may shower 24 hours after the drain is placed. To do this cover the insertion site with a water tight material such as saran wrap and seal the edges with tape, you may also purchase waterproof dressings at your local drug store. Shower as usual and then remove the water tight dressing and any gauze/tape underneath it once you have exited the shower and dried off. Allow the area to air dry or pat dry with a clean towel. Once the skin is completely dry place a new gauze dressing. It is important to keep the site dry at all times to prevent infection.   Do not submerge the drain - this means you cannot take baths, swim, use a hot tub, etc. until the drain is removed.    Do not use any products that contain nicotine or tobacco, such as cigarettes, e-cigarettes, and chewing tobacco. If you need help quitting, ask your health care provider.   Keep all follow-up visits as told by your health care provider. This is important.   Contact a health care provider if:   You have less than 10 mL of drainage a day for 2-3 days in a row, or as directed by your health care provider.   You have any of these signs of infection:   More redness, swelling, or pain around your incision area.   More fluid or blood coming from your incision area.   Warmth coming from your incision area.   Pus or a bad smell coming from your incision area.   You have fluid leaking from around your catheter (instead of through your catheter).   You are unable to flush the drain.   You have a fever or chills.   You have pain that does not get better with medicine.   You have not been contacted to schedule a drain follow up appointment  within 10 days of discharge from the hospital.   Please call [facility] Radiology at [phone] with any questions or concerns.   Get help right away if:   Your catheter comes out.   You suddenly stop having drainage from your catheter.   You suddenly have blood in the fluid that is draining from your catheter.   You become dizzy or you faint.   You develop a rash.   You have nausea or vomiting.   You have difficulty breathing or you feel short  of breath.   You develop chest pain.   You have problems with your speech or vision.   You have trouble balancing or moving your arms or legs.   Summary   It is common to have a small amount of bruising and discomfort in the area where the drainage tube (catheter) was placed. You may also have minor discomfort with movement while the drain is in place.   Flush the drain once per day with 5 mL of 0.9% normal saline (unless you were told otherwise by your healthcare provider).    Record the amount of drainage from the bag every time you empty it.   Change the dressing every 3 days or earlier if soiled/wet. Keep the skin dry under the dressing.   You may shower with the drain in place. Do not submerge the drain (no baths, swimming, hot tubs, etc.).   Contact [facility] at [phone] if you have more redness, swelling, or pain around your incision area or if you have pain that does not get better with medicine.   This information is not intended to replace advice given to you by your health care provider. Make sure you discuss any questions you have with your health care provider.   Document Revised: 04/21/2021 Document Reviewed: 01/11/2019   Elsevier Patient Education  2023 [employer]         Interventional Radiology Drain Record   Empty your drain at least once per day. You may empty it as often as needed. Use this form to write down the amount of fluid that has collected in the drainage container. Bring  this form with you to your follow-up visits. Please call [facility] Radiology at [phone] with any questions or concerns prior to your appointment.   Drain #1 location: ___________________   Date __________ Time __________ Amount __________   Date __________ Time __________ Amount __________   Date __________ Time __________ Amount __________   Date __________ Time __________ Amount __________   Date __________ Time __________ Amount __________   Date __________ Time __________ Amount __________   Date __________ Time __________ Amount __________   Date __________ Time __________ Amount __________   Date __________ Time __________ Amount __________   Date __________ Time __________ Amount __________   Date __________ Time __________ Amount __________   Date __________ Time __________ Amount __________   Date __________ Time __________ Amount __________   Date __________ Time __________ Amount __________

## 2023-03-02 NOTE — Progress Notes (Signed)
 TOC discharge meds in a secure bag delivered to pt in room by this RN

## 2023-03-02 NOTE — Progress Notes (Signed)
Referring Physician(s): Connor,C  Supervising Physician: Simonne Come  Patient Status:  [facility] - In-pt  Chief Complaint: Abdominal pain,  gallbladder fossa fluid collection   Subjective: Patient states he feels better since abdominal drain placed yesterday.  Asking about going home multiple times during visit.  Denies worsening abdominal pain, nausea, vomiting or fever,   Allergies: Bee venom and Sulfa antibiotics  Medications: Prior to Admission medications   Medication Sig Start Date End Date Taking? Authorizing Provider  albuterol (VENTOLIN HFA) 108 (90 Base) MCG/ACT inhaler Inhale 2 puffs into the lungs every 6 (six) hours as needed for wheezing or shortness of breath. Patient taking differently: Inhale 1 puff into the lungs in the morning and at bedtime. 01/14/23  Yes Rolly Salter, MD  gabapentin (NEURONTIN) 300 MG capsule Take 600 mg by mouth at bedtime.   Yes [provider]  methocarbamol (ROBAXIN) 500 MG tablet Take 1 tablet (500 mg total) by mouth 3 (three) times daily. Patient taking differently: Take 500 mg by mouth 2 (two) times daily as needed for muscle spasms. 02/23/23  Yes Crain, Whitney L, PA  oxyCODONE (OXY IR/ROXICODONE) 5 MG immediate release tablet Take 1 tablet (5 mg total) by mouth every 6 (six) hours as needed for severe pain (pain score 7-10). Patient taking differently: Take 5 mg by mouth daily as needed for severe pain (pain score 7-10). 02/23/23  Yes Crain, Whitney L, PA  levETIRAcetam (KEPPRA) 250 MG tablet Take 250 mg by mouth 2 (two) times daily. Patient not taking: Reported on 03/01/2023    [provider]  mometasone-formoterol (DULERA) 100-5 MCG/ACT AERO Inhale 2 puffs into the lungs 2 (two) times daily. Patient not taking: Reported on 03/01/2023 02/18/23   Leeroy Bock, MD  nystatin (MYCOSTATIN) 100000 UNIT/ML suspension Take 5 mLs (500,000 Units total) by mouth 4 (four) times daily. Swish for 90 seconds and swallow Patient  not taking: Reported on 03/01/2023 02/23/23   Guy Sandifer L, PA     Vital Signs: BP 128/67 (BP Location: Right Arm)   Pulse 75   Temp 98.1 F (36.7 C)   Resp 17   Ht 5\' 10"  (1.778 m)   Wt 110 lb 11.2 oz (50.2 kg)   SpO2 97%   BMI 15.88 kg/m   Physical Exam awake, alert.  Right upper quadrant drain intact, insertion site okay, nontender to palpation, output 65 cc serous fluid with some tissue fragments.  Drain flushed without difficulty.  Imaging: CT GUIDED VISCERAL FLUID DRAIN BY [facility] CATH Result Date: 03/01/2023 INDICATION: [id number] Abdominal fluid collection [phone]. Recent cholecystectomy with gallbladder fossa collection EXAM: CT-GUIDED PERCUTANEOUS DRAINAGE CATHETER PLACEMENT into a GALLBLADDER FOSSA COLLECTION COMPARISON:  CT AP, 02/28/2023.  US Abdomen, 02/23/2023. MEDICATIONS: The patient is currently admitted to the hospital and receiving intravenous antibiotics. The antibiotics were administered within an appropriate time frame prior to the initiation of the procedure. ANESTHESIA/SEDATION: Moderate (conscious) sedation was employed during this procedure. A total of Versed 2 mg and Fentanyl 100 mcg was administered intravenously. Moderate Sedation Time: 19 minutes. The patient's level of consciousness and vital signs were monitored continuously by radiology nursing throughout the procedure under my direct supervision. CONTRAST:  None COMPLICATIONS: None immediate. PROCEDURE: RADIATION DOSE REDUCTION: This exam was performed according to the departmental dose-optimization program which includes automated exposure control, adjustment of the mA and/or kV according to patient size and/or use of iterative reconstruction technique. Informed written consent was obtained from the patient and/or patient's representative after a  discussion of the risks, benefits and alternatives to treatment. The patient was placed supine on the CT gantry and a pre procedural CT was performed re-demonstrating the  known abscess/fluid collection within the gallbladder fossa. The procedure was planned. A timeout was performed prior to the initiation of the procedure. The RIGHT upper quadrant was prepped and draped in the usual sterile fashion. The overlying soft tissues were anesthetized with 1% lidocaine with epinephrine. Appropriate trajectory was planned with the use of a 22 gauge spinal needle. An 18 gauge trocar needle was advanced into the abscess/fluid collection and a short Amplatz super stiff wire was coiled within the collection. Appropriate positioning was confirmed with a limited CT scan. The tract was serially dilated allowing placement of a 12 Fr drainage catheter. Appropriate positioning was confirmed with a limited postprocedural CT scan. 10 mL of bilious fluid was aspirated. The tube was connected to a bulb suction and sutured in place. A dressing was placed. The patient tolerated the procedure well without immediate post procedural complication. IMPRESSION: Successful CT guided placement of a 12 Fr drainage catheter into the gallbladder fossa collection with aspiration of 10 mL of bilious fluid. Samples were sent to the laboratory as requested by the ordering clinical team. RECOMMENDATIONS: The patient will return to Vascular Interventional Radiology (VIR) for routine drainage catheter evaluation and exchange in 10-14 days. Roanna Banning, MD [facility] Electronically Signed   By: Roanna Banning M.D.   On: 03/01/2023 17:35   CT ABDOMEN PELVIS W CONTRAST Result Date: 02/28/2023 CLINICAL DATA:  Right upper quadrant abdominal pain status post cholecystectomy 2 weeks ago. EXAM: CT ABDOMEN AND PELVIS WITH CONTRAST TECHNIQUE: Multidetector CT imaging of the abdomen and pelvis was performed using the standard protocol following bolus administration of intravenous contrast. RADIATION DOSE REDUCTION: This exam was performed according to the departmental  dose-optimization program which includes automated exposure control, adjustment of the mA and/or kV according to patient size and/or use of iterative reconstruction technique. CONTRAST:  OMNIPAQUE IOHEXOL 300 MG/ML  SOLN COMPARISON:  February 13, 2023. FINDINGS: Lower chest: No acute abnormality. Hepatobiliary: Stable hepatic cysts. Status post cholecystectomy. 5.8 x 5.2 cm fluid collection is seen in gallbladder fossa most consistent with biloma or less likely abscess given recent surgical history. Mild intrahepatic and extrahepatic biliary dilatation is noted most consistent with post cholecystectomy status. Pancreas: Unremarkable. No pancreatic ductal dilatation or surrounding inflammatory changes. Spleen: Normal in size without focal abnormality. Adrenals/Urinary Tract: Adrenal glands appear normal. Horseshoe configuration of kidneys is noted. No hydronephrosis or renal obstruction is noted. Urinary bladder is unremarkable. Stomach/Bowel: Stomach is unremarkable. Status post appendectomy. There is no evidence of bowel obstruction or inflammation. Vascular/Lymphatic: Aortic atherosclerosis. No enlarged abdominal or pelvic lymph nodes. Reproductive: Stable mild prostatic enlargement. Other: No abdominal wall hernia or abnormality. No abdominopelvic ascites. 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 IMPRESSION: Status post cholecystectomy. 5.8 x 5.2 cm fluid collection is seen in gallbladder fossa most consistent with biloma or less likely abscess given recent surgery. Stable mild intrahepatic and extrahepatic biliary dilatation is noted. These results will be called to the ordering clinician or representative by the Radiologist Assistant, and communication documented in the PACS or zVision Dashboard. Electronically Signed   By: Lupita Raider M.D.   On: 02/28/2023 08:20    Labs:  CBC: Recent Labs    02/23/23 0945 02/28/23 1724 03/01/23 0350  03/02/23 0337  WBC 13.6* 12.7* 10.7* 11.2*  HGB 12.0* 11.5* 10.6* 11.3*  HCT 37.5* 36.5* 33.0* 35.4*  PLT 694.0* 748* 390 525*    COAGS: Recent Labs    02/28/23 1724  INR 1.1    BMP: Recent Labs    02/18/23 0501 02/23/23 0945 02/28/23 1724 03/01/23 0350 03/02/23 0337  NA 135 140 140 137 136  K 4.3 4.7 4.1 3.9 4.3  CL 101 102 105 106 105  CO2 28 29 27 24 22   GLUCOSE 104* 81 94 90 90  BUN 13 15 9 9 13   CALCIUM 8.8* 9.0 9.1 8.4* 8.6*  CREATININE 0.89 0.73 0.74 0.70 0.81  GFRNONAA >60  --  >60 >60 >60    LIVER FUNCTION TESTS: Recent Labs    02/18/23 0501 02/23/23 0945 02/28/23 1724 03/02/23 0337  BILITOT 0.6 0.4 0.4 0.8  AST 22 15 16 17   ALT 16 13 13 13   ALKPHOS 62 93 97 88  PROT 6.3* 6.2 6.7 6.2*  ALBUMIN 3.0* 3.7 3.2* 2.8*    Assessment and Plan: Patient status post laparoscopic cholecystectomy on 02/16/2023 for acute on chronic cholecystitis; now with gallbladder fossa fluid collection; s/p post 12 French drain placement yesterday to JP; afebrile, cultures pending, WBC 11.2(10.7), hemoglobin 11.3 (10.6), creat normal, total bilirubin normal; as outpatient recommend once daily flush of drain with 5 cc sterile saline, output recording and dressing change every 2 to 3 days.  Patient will be scheduled for follow-up in IR drain clinic in 10 to 14 days.  Pt aware of drain flushing regimen as he previously had a cholecystostomy tube.   Electronically Signed: D. Jeananne Rama, PA-C 03/02/2023, 11:42 AM   I spent a total of 15 Minutes at the the patient's bedside AND on the patient's hospital floor or unit, greater than 50% of which was counseling/coordinating care for gallbladder fossa  fluid collection drain    Patient ID: Aaron Stevens, male   DOB: 01/12/1956, 67 y.o.   MRN: [id number]

## 2023-03-06 ENCOUNTER — Other Ambulatory Visit: Payer: Self-pay | Admitting: Physician Assistant

## 2023-03-06 ENCOUNTER — Telehealth: Payer: Self-pay | Admitting: Urology

## 2023-03-06 ENCOUNTER — Telehealth: Payer: Self-pay | Admitting: Interventional Radiology

## 2023-03-06 DIAGNOSIS — K8042 Calculus of bile duct with acute cholecystitis without obstruction: Secondary | ICD-10-CM

## 2023-03-06 LAB — AEROBIC/ANAEROBIC CULTURE W GRAM STAIN (SURGICAL/DEEP WOUND): Culture: NO GROWTH

## 2023-03-06 NOTE — Telephone Encounter (Signed)
 Patient's wife called IR office this afternoon, with concerns about her husband's gallbladder fossa drain output transitioning from bilious to bloody/red over the past day or so. Drain placed by Dr. Hughes on 03/01/23, patient discharged 03/02/23. Patient denies worsening pain, fevers, chill, nausea, vomiting.   Advised patient and his wife that we will attempt to bring patient in for evaluation by IR staff/APP as soon as possible. Patient was advised to present to ED should his presentation worsen in the interim. Patient and his wife voiced understanding and are amenable to this plan.   Electronically Signed: Carlin DELENA Griffon, PA-C 03/06/2023, 3:43 PM

## 2023-03-07 ENCOUNTER — Inpatient Hospital Stay (HOSPITAL_COMMUNITY)
Admission: RE | Admit: 2023-03-07 | Discharge: 2023-03-07 | Disposition: A | Discharge status: Home / Self Care | Payer: Medicare Other | Source: Ambulatory Visit | Attending: Physician Assistant | Admitting: Physician Assistant

## 2023-03-09 ENCOUNTER — Other Ambulatory Visit: Payer: Self-pay | Admitting: Student

## 2023-03-09 ENCOUNTER — Telehealth (HOSPITAL_COMMUNITY): Payer: Self-pay | Admitting: Student

## 2023-03-09 DIAGNOSIS — K8042 Calculus of bile duct with acute cholecystitis without obstruction: Secondary | ICD-10-CM

## 2023-03-09 NOTE — Telephone Encounter (Signed)
 Patient with gallbladder fossa drain placed 03/01/23. He is scheduled for drain evaluation 03/26/23. Patient called IR today requesting appointment be moved up because he wants the drain out. He denies any problems with the drain and he is without pain. He states he just wants the drain out. Schedulers aware and they will try to accommodate.  Warren Dais, AGACNP-BC 03/09/2023, 10:42 AM

## 2023-03-11 ENCOUNTER — Other Ambulatory Visit: Payer: Self-pay

## 2023-03-11 ENCOUNTER — Emergency Department (HOSPITAL_COMMUNITY)
Admission: EM | Admit: 2023-03-11 | Discharge: 2023-03-11 | Discharge status: Against Medical Advice | Payer: Medicare Other | Attending: Emergency Medicine | Admitting: Emergency Medicine

## 2023-03-11 DIAGNOSIS — Z5321 Procedure and treatment not carried out due to patient leaving prior to being seen by health care provider: Secondary | ICD-10-CM | POA: Insufficient documentation

## 2023-03-11 DIAGNOSIS — Y828 Other medical devices associated with adverse incidents: Secondary | ICD-10-CM | POA: Diagnosis not present

## 2023-03-11 DIAGNOSIS — T85698A Other mechanical complication of other specified internal prosthetic devices, implants and grafts, initial encounter: Secondary | ICD-10-CM | POA: Diagnosis present

## 2023-03-11 NOTE — ED Triage Notes (Addendum)
 Pt arrived via POV. C/o clogged JP drain. When drain was flushed today fluid leaked out from around drain site. Some pain around site  AOx4

## 2023-03-12 ENCOUNTER — Ambulatory Visit: Payer: Self-pay | Admitting: Urgent Care

## 2023-03-12 NOTE — Telephone Encounter (Signed)
 Pt refused to go to ED back he does not want to get sick. There was too much coughing when they went yesterday. States he has an upcoming appointment and he will see what they do about it then.

## 2023-03-12 NOTE — Transitions of Care (Post Inpatient/ED Visit) (Signed)
   03/12/2023  Name: Aaron Stevens MRN: [id number] DOB: 08-08-1955  Today's TOC FU Call Status: Today's TOC FU Call Status:: Successful TOC FU Call Completed Unsuccessful Call (1st Attempt) Date: 03/11/23 [facility] FU Call Complete Date: 03/12/23 Patient's Name and Date of Birth confirmed.  Transition Care Management Follow-up Telephone Call   Pt left AMA Date of Discharge: 03/11/23 Discharge Facility: [facility]) Type of Discharge: Emergency Department Reason for ED Visit: Other: How have you been since you were released from the hospital?: Better Any questions or concerns?: Yes Patient Questions/Concerns:: advised pt of PCP recomendation Patient Questions/Concerns Addressed: Other:  Items Reviewed:    Medications Reviewed Today: Medications Reviewed Today   Medications were not reviewed in this encounter     Home Care and Equipment/Supplies:    Functional Questionnaire:    Follow up appointments reviewed:      SIGNATURE [place]

## 2023-03-12 NOTE — Telephone Encounter (Addendum)
 Information obtained from wife Mariah Shines, [place] completed.   Chief Complaint: redness at post op drain site,  Symptoms: redness,  Frequency: yesterday  Disposition: [x] ED /[] Urgent Care (no appt availability in office) / [] Appointment(In office/virtual)/ []  [place] [facility]/ [] Home Care/ [] Refused Recommended Disposition /[] [place] [facility]/ []  Follow-up with PCP  Additional Notes: Pt still has drain from cholecystectomy.  Per wife she has been flushing the drain. When the wife flushed yesterday she notes the fluid coming out the entry point of the drain. Pt was getting less drainage and clear in color. Initially drainage was red in color and more.  This changed about a week ago. Pt with chills during call. Pt also can be heard screaming d/t pain. Wife notes "redness and black around the tube hole". Wife has spoken to Careers adviser, they advised to go to ED if the pt felt it was infected. Pt was in ED yesterday but LWBS. Pt has not had a BM in 4 days and he normally goes once a day.   Copied from CRM [id number]. Topic: Clinical - Red Word Triage >> Mar 12, 2023  9:13 AM Evie Hoff wrote: [facility] that prompted transfer to Nurse Triage: was suppose to get drain out . Went to emergency room l;ast night . The wait was so long he didn't want to stay . Yesterday when I put saline in the drain 5 cc . It was draining out of his skin where incision . There was a amount that came out . He is not getting a lot in the tube thing . When emptying it last night it was not 5 ml . He is not ok . He is in pain . Dont know what to do Reason for Disposition  Severe pain in the incision  Answer Assessment - Initial Assessment Questions 1. SYMPTOM: "What's the main symptom you're concerned about?" (e.g., drainage, incision opening up, pain, redness)     Pink around the incision area, denies drainage when not flushing the drain, pain around incision site-just started 3 days ago.  2. ONSET: "When did pink  start?"      Yesterday  3. SURGERY: "What surgery did you have?"     Gallbladder out 4. DATE of SURGERY: "When was the surgery?"      Dec 17th 5. INCISION SITE: "Where is the incision located?"      R side  6. REDNESS: "Is there any redness at the incision site?" If Yes, ask: "How wide across is the redness?" (Inches, centimeters)      redness 7. PAIN: "Is there any pain?" If Yes, ask: "How bad is it?"  (Scale 1-10; or mild, moderate, severe)   - NONE (0): no pain   - MILD (1-3): doesn't interfere with normal activities    - MODERATE (4-7): interferes with normal activities or awakens from sleep    - SEVERE (8-10): excruciating pain, unable to do any normal activities     7-9 8. BLEEDING: "Is there any bleeding?" If Yes, ask: "How much?" and "Where?"     denies 9. DRAINAGE: "Is there any drainage from the incision site?" If Yes, ask: "What color and how much?" (e.g., red, cloudy, pus; drops, teaspoon)     Denies drainage at this time, only when flushing 10. FEVER: "Do you have a fever?" If Yes, ask: "What is your temperature, how was it measured, and when did it start?"       denies 11. OTHER SYMPTOMS: "Do you have any  other symptoms?" (e.g., dizziness, rash elsewhere on body, shaking chills, weakness)       chills  Protocols used: Post-Op Incision Symptoms and Questions-A-AH

## 2023-03-12 NOTE — Telephone Encounter (Signed)
 Yes, ED is required for those complaints.

## 2023-03-14 ENCOUNTER — Ambulatory Visit
Admission: RE | Admit: 2023-03-14 | Discharge: 2023-03-14 | Discharge status: Home / Self Care | Payer: Medicare Other | Source: Ambulatory Visit | Attending: Student

## 2023-03-14 ENCOUNTER — Other Ambulatory Visit: Payer: Self-pay | Admitting: Student

## 2023-03-14 ENCOUNTER — Ambulatory Visit
Admission: RE | Admit: 2023-03-14 | Discharge: 2023-03-14 | Disposition: A | Discharge status: Home / Self Care | Payer: Medicare Other | Source: Ambulatory Visit | Attending: Student | Admitting: Student

## 2023-03-14 DIAGNOSIS — K8042 Calculus of bile duct with acute cholecystitis without obstruction: Secondary | ICD-10-CM

## 2023-03-14 HISTORY — PX: IR RADIOLOGIST EVAL & MGMT: IMG5224

## 2023-03-14 MED ORDER — IOPAMIDOL (ISOVUE-300) INJECTION 61%
100.0000 mL | Freq: Once | INTRAVENOUS | Status: DC | PRN
Start: 2023-03-14 — End: 2023-03-15

## 2023-03-16 NOTE — Telephone Encounter (Signed)
Error

## 2023-03-21 ENCOUNTER — Encounter (HOSPITAL_COMMUNITY): Payer: Self-pay

## 2023-03-21 ENCOUNTER — Ambulatory Visit (HOSPITAL_COMMUNITY): Admit: 2023-03-21 | Payer: Medicare Other | Admitting: Gastroenterology

## 2023-03-21 SURGERY — UPPER ESOPHAGEAL ENDOSCOPIC ULTRASOUND (EUS)
Anesthesia: Monitor Anesthesia Care | Laterality: Bilateral

## 2023-03-23 ENCOUNTER — Ambulatory Visit: Payer: Medicare Other | Admitting: Urgent Care

## 2023-03-26 ENCOUNTER — Other Ambulatory Visit (HOSPITAL_COMMUNITY): Payer: Medicare Other

## 2023-06-20 ENCOUNTER — Ambulatory Visit (INDEPENDENT_AMBULATORY_CARE_PROVIDER_SITE_OTHER): Admitting: *Deleted

## 2023-06-20 DIAGNOSIS — Z0000 Encounter for general adult medical examination without abnormal findings: Secondary | ICD-10-CM | POA: Diagnosis not present

## 2023-06-20 NOTE — Progress Notes (Signed)
 Subjective:   Aaron Stevens is a 68 y.o. male who presents for Medicare Annual/Subsequent preventive examination.  Visit Complete: Virtual I connected with  Filiberto Hug on 06/20/23 by a audio enabled telemedicine application and verified that I am speaking with the correct person using two identifiers.  Patient Location: Home  Provider Location: Home Office  I discussed the limitations of evaluation and management by telemedicine. The patient expressed understanding and agreed to proceed.  Vital Signs: Because this visit was a virtual/telehealth visit, some criteria may be missing or patient reported. Any vitals not documented were not able to be obtained and vitals that have been documented are patient reported.  Cardiac Risk Factors include: advanced age (>66men, >23 women);male gender     Objective:     There were no vitals filed for this visit. There is no height or weight on file to calculate BMI.     06/20/2023    8:12 AM 03/11/2023    4:33 PM 02/28/2023   11:12 AM 02/16/2023    8:45 AM 02/11/2023   10:00 AM 01/09/2023    2:53 PM 10/29/2022   10:44 AM  Advanced Directives  Does Patient Have a Medical Advance Directive? No No No No No No No  Would patient like information on creating a medical advance directive? No - Patient declined  No - Patient declined No - Patient declined No - Patient declined No - Patient declined No - Patient declined    Current Medications (verified) Outpatient Encounter Medications as of 06/20/2023  Medication Sig   methocarbamol  (ROBAXIN ) 500 MG tablet Take 1 tablet (500 mg total) by mouth 3 (three) times daily. (Patient taking differently: Take 500 mg by mouth 2 (two) times daily as needed for muscle spasms.)   oxyCODONE  (OXY IR/ROXICODONE ) 5 MG immediate release tablet Take 1 tablet (5 mg total) by mouth every 6 (six) hours as needed for severe pain (pain score 7-10). (Patient taking differently: Take 5 mg by mouth daily as needed  for severe pain (pain score 7-10).)   albuterol  (VENTOLIN  HFA) 108 (90 Base) MCG/ACT inhaler Inhale 2 puffs into the lungs every 6 (six) hours as needed for wheezing or shortness of breath. (Patient taking differently: Inhale 1 puff into the lungs in the morning and at bedtime.)   gabapentin  (NEURONTIN ) 300 MG capsule Take 600 mg by mouth at bedtime.   levETIRAcetam (KEPPRA) 250 MG tablet Take 250 mg by mouth 2 (two) times daily. (Patient not taking: Reported on 03/01/2023)   mometasone -formoterol  (DULERA ) 100-5 MCG/ACT AERO Inhale 2 puffs into the lungs 2 (two) times daily. (Patient not taking: Reported on 03/01/2023)   nystatin  (MYCOSTATIN ) 100000 UNIT/ML suspension Take 5 mLs (500,000 Units total) by mouth 4 (four) times daily. Swish for 90 seconds and swallow (Patient not taking: Reported on 03/01/2023)   sodium chloride  flush (NS) 0.9 % SOLN 5 mLs by Intracatheter route every 12 (twelve) hours. (Patient not taking: Reported on 06/20/2023)   No facility-administered encounter medications on file as of 06/20/2023.    Allergies (verified) Bee venom and Sulfa  antibiotics   History: Past Medical History:  Diagnosis Date   Atherosclerosis of artery    Emphysema of lung (HCC)    Pneumonia    Past Surgical History:  Procedure Laterality Date   APPENDECTOMY     CHOLECYSTECTOMY N/A 02/16/2023   Procedure: LAPAROSCOPIC CHOLECYSTECTOMY WITH ICG;  Surgeon: Enid Harry, MD;  Location: [facility];  Service: General;  Laterality: N/A;   ESOPHAGOGASTRODUODENOSCOPY N/A  02/14/2023   Procedure: ESOPHAGOGASTRODUODENOSCOPY (EGD);  Surgeon: Evangeline Hilts, MD;  Location: [facility];  Service: Gastroenterology;  Laterality: N/A;   EUS Left 02/14/2023   Procedure: UPPER ENDOSCOPIC ULTRASOUND (EUS) LINEAR;  Surgeon: Evangeline Hilts, MD;  Location: [facility];  Service: Gastroenterology;  Laterality: Left;   IR EXCHANGE BILIARY DRAIN  02/12/2023   IR PERC CHOLECYSTOSTOMY  01/12/2023   IR RADIOLOGIST EVAL &  MGMT  03/14/2023   IR SINUS/FIST TUBE CHK-NON GI  01/25/2023   KNEE SURGERY     History reviewed. No pertinent family history. Social History   Socioeconomic History   Marital status: Married    Spouse name: Not on file   Number of children: Not on file   Years of education: Not on file   Highest education level: Not on file  Occupational History   Not on file  Tobacco Use   Smoking status: Former    Types: Cigarettes   Smokeless tobacco: Never   Tobacco comments:    Quit Feb 10, 2023  Vaping Use   Vaping status: Never Used  Substance and Sexual Activity   Alcohol use: No   Drug use: No   Sexual activity: Not Currently    Partners: Female  Other Topics Concern   Not on file  Social History Narrative   ** Merged History Encounter **       Social Drivers of Health   Financial Resource Strain: Low Risk  (06/20/2023)   Overall Financial Resource Strain (CARDIA)    Difficulty of Paying Living Expenses: Not hard at all  Food Insecurity: No Food Insecurity (06/20/2023)   Hunger Vital Sign    Worried About Running Out of Food in the Last Year: Never true    Ran Out of Food in the Last Year: Never true  Transportation Needs: No Transportation Needs (06/20/2023)   PRAPARE - Administrator, Civil Service (Medical): No    Lack of Transportation (Non-Medical): No  Physical Activity: Inactive (06/20/2023)   Exercise Vital Sign    Days of Exercise per Week: 0 days    Minutes of Exercise per Session: 0 min  Stress: No Stress Concern Present (06/20/2023)   [employer] of Occupational Health - Occupational Stress Questionnaire    Feeling of Stress : Not at all  Social Connections: Moderately Isolated (06/20/2023)   Social Connection and Isolation Panel [NHANES]    Frequency of Communication with Friends and Family: Twice a week    Frequency of Social Gatherings with Friends and Family: More than three times a week    Attends Religious Services: Never    Automotive engineer or Organizations: No    Attends Banker Meetings: Never    Marital Status: Married    Tobacco Counseling Counseling given: Not Answered Tobacco comments: Quit Feb 10, 2023   Clinical Intake:  Pre-visit preparation completed: Yes  Pain : No/denies pain     Diabetes: No  How often do you need to have someone help you when you read instructions, pamphlets, or other written materials from your doctor or pharmacy?: 1 - Never  Interpreter Needed?: No  Information entered by :: Kieth Pelt LPN   Activities of Daily Living    06/20/2023    8:16 AM 02/28/2023    5:00 PM  In your present state of health, do you have any difficulty performing the following activities:  Hearing? 0 0  Vision? 0 0  Difficulty concentrating or making decisions? 0  0  Walking or climbing stairs? 0   Dressing or bathing? 0   Doing errands, shopping? 0 0  Preparing Food and eating ? N   Using the Toilet? N   In the past six months, have you accidently leaked urine? N   Do you have problems with loss of bowel control? N   Managing your Medications? N   Managing your Finances? N   Housekeeping or managing your Housekeeping? N     Patient Care Team: [name], [place] as PCP - General (Physician Assistant)  Indicate any recent Medical Services you may have received from other than [facility] providers in the past year (date may be approximate).     Assessment:    This is a routine wellness examination for [place].  Hearing/Vision screen Hearing Screening - Comments:: No trouble hearing Vision Screening - Comments:: Not up to date   Goals Addressed             This Visit's Progress    Patient Stated       No goals at this time       Depression Screen    06/20/2023    8:18 AM  PHQ 2/9 Scores  PHQ - 2 Score 0  PHQ- 9 Score 0    Fall Risk    06/20/2023    8:12 AM  Fall Risk   Falls in the past year? 0  Number falls in past yr: 0  Injury with Fall? 0   Follow up Falls evaluation completed;Education provided;Falls prevention discussed    MEDICARE RISK AT HOME: Medicare Risk at Home Any stairs in or around the home?: No If so, are there any without handrails?: No Home free of loose throw rugs in walkways, pet beds, electrical cords, etc?: Yes Adequate lighting in your home to reduce risk of falls?: Yes Life alert?: No Use of a cane, walker or w/c?: No Grab bars in the bathroom?: No Shower chair or bench in shower?: No Elevated toilet seat or a handicapped toilet?: No  TIMED UP AND GO:  Was the test performed?  No    Cognitive Function:        06/20/2023    8:15 AM  6CIT Screen  What Year? 0 points  What month? 0 points  Count back from 20 0 points  Months in reverse 0 points  Repeat phrase 0 points    Immunizations  There is no immunization history on file for this patient.  TDAP status: Due, Education has been provided regarding the importance of this vaccine. Advised may receive this vaccine at local pharmacy or Health Dept. Aware to provide a copy of the vaccination record if obtained from local pharmacy or Health Dept. Verbalized acceptance and understanding.  Flu Vaccine status: Up to date  Pneumococcal vaccine status: Due, Education has been provided regarding the importance of this vaccine. Advised may receive this vaccine at local pharmacy or Health Dept. Aware to provide a copy of the vaccination record if obtained from local pharmacy or Health Dept. Verbalized acceptance and understanding.  Covid-19 vaccine status: Information provided on how to obtain vaccines.   Qualifies for Shingles Vaccine? Yes   Zostavax completed No   Shingrix Completed?: No.    Education has been provided regarding the importance of this vaccine. Patient has been advised to call insurance company to determine out of pocket expense if they have not yet received this vaccine. Advised may also receive vaccine at local pharmacy or Health  Dept. Verbalized acceptance and understanding.  Screening Tests Health Maintenance  Topic Date Due   Hepatitis C Screening  Never done   Colonoscopy  Never done   DTaP/Tdap/Td (1 - Tdap) 07/11/2023 (Originally 09/17/1974)   Pneumonia Vaccine 76+ Years old (1 of 2 - PCV) 07/11/2023 (Originally 09/17/1974)   Zoster Vaccines- Shingrix (1 of 2) 07/11/2023 (Originally 09/17/2004)   COVID-19 Vaccine (1 - 2024-25 season) 01/24/2024 (Originally 10/01/2022)   INFLUENZA VACCINE  08/31/2023   Medicare Annual Wellness (AWV)  06/19/2024   HPV VACCINES  Aged Out   Meningococcal B Vaccine  Aged Out    Health Maintenance  Health Maintenance Due  Topic Date Due   Hepatitis C Screening  Never done   Colonoscopy  Never done    Colonoscopy patient declined   Lung Cancer Screening: (Low Dose CT Chest recommended if Age 75-80 years, 20 pack-year currently smoking OR have quit w/in 15years.) does not qualify.   Lung Cancer Screening Referral:   Additional Screening:  Hepatitis C Screening:   never done  Vision Screening: Recommended annual ophthalmology exams for early detection of glaucoma and other disorders of the eye. Is the patient up to date with their annual eye exam?  No  Who is the provider or what is the name of the office in which the patient attends annual eye exams? Education provided If pt is not established with a provider, would they like to be referred to a provider to establish care? No .   Dental Screening: Recommended annual dental exams for proper oral hygiene   Community Resource Referral / Chronic Care Management: CRR required this visit?  No   CCM required this visit?  No     Plan:     I have personally reviewed and noted the following in the patient's chart:   Medical and social history Use of alcohol, tobacco or illicit drugs  Current medications and supplements including opioid prescriptions. Patient is not currently taking opioid prescriptions. Functional  ability and status Nutritional status Physical activity Advanced directives List of other physicians Hospitalizations, surgeries, and ER visits in previous 12 months Vitals Screenings to include cognitive, depression, and falls Referrals and appointments  In addition, I have reviewed and discussed with patient certain preventive protocols, quality metrics, and best practice recommendations. A written personalized care plan for preventive services as well as general preventive health recommendations were provided to patient.     Kieth Pelt, LPN   03/15/863   After Visit Summary: (MyChart) Due to this being a telephonic visit, the after visit summary with patients personalized plan was offered to patient via MyChart   Nurse Notes:

## 2023-06-20 NOTE — Patient Instructions (Signed)
 Mr. Aaron Stevens , Thank you for taking time to come for your Medicare Wellness Visit. I appreciate your ongoing commitment to your health goals. Please review the following plan we discussed and let me know if I can assist you in the future.   Screening recommendations/referrals: Colonoscopy: Education provided Recommended yearly ophthalmology/optometry visit for glaucoma screening and checkup Recommended yearly dental visit for hygiene and checkup  Vaccinations: Influenza vaccine: up to date Pneumococcal vaccine: Education provided Tdap vaccine: Education provided Shingles vaccine: Education provided   Advanced directives: Education provided  Preventive Care 65 Years and Older, Male Preventive care refers to lifestyle choices and visits with your health care provider that can promote health and wellness. What does preventive care include? A yearly physical exam. This is also called an annual well check. Dental exams once or twice a year. Routine eye exams. Ask your health care provider how often you should have your eyes checked. Personal lifestyle choices, including: Daily care of your teeth and gums. Regular physical activity. Eating a healthy diet. Avoiding tobacco and drug use. Limiting alcohol use. Practicing safe sex. Taking low doses of aspirin every day. Taking vitamin and mineral supplements as recommended by your health care provider. What happens during an annual well check? The services and screenings done by your health care provider during your annual well check will depend on your age, overall health, lifestyle risk factors, and family history of disease. Counseling  Your health care provider may ask you questions about your: Alcohol use. Tobacco use. Drug use. Emotional well-being. Home and relationship well-being. Sexual activity. Eating habits. History of falls. Memory and ability to understand (cognition). Work and work Astronomer. Screening  You may  have the following tests or measurements: Height, weight, and BMI. Blood pressure. Lipid and cholesterol levels. These may be checked every 5 years, or more frequently if you are over 27 years old. Skin check. Lung cancer screening. You may have this screening every year starting at age 77 if you have a 30-pack-year history of smoking and currently smoke or have quit within the past 15 years. Fecal occult blood test (FOBT) of the stool. You may have this test every year starting at age 77. Flexible sigmoidoscopy or colonoscopy. You may have a sigmoidoscopy every 5 years or a colonoscopy every 10 years starting at age 74. Prostate cancer screening. Recommendations will vary depending on your family history and other risks. Hepatitis C blood test. Hepatitis B blood test. Sexually transmitted disease (STD) testing. Diabetes screening. This is done by checking your blood sugar (glucose) after you have not eaten for a while (fasting). You may have this done every 1-3 years. Abdominal aortic aneurysm (AAA) screening. You may need this if you are a current or former smoker. Osteoporosis. You may be screened starting at age 60 if you are at high risk. Talk with your health care provider about your test results, treatment options, and if necessary, the need for more tests. Vaccines  Your health care provider may recommend certain vaccines, such as: Influenza vaccine. This is recommended every year. Tetanus, diphtheria, and acellular pertussis (Tdap, Td) vaccine. You may need a Td booster every 10 years. Zoster vaccine. You may need this after age 35. Pneumococcal 13-valent conjugate (PCV13) vaccine. One dose is recommended after age 23. Pneumococcal polysaccharide (PPSV23) vaccine. One dose is recommended after age 2. Talk to your health care provider about which screenings and vaccines you need and how often you need them. This information is not intended to replace  advice given to you by your  health care provider. Make sure you discuss any questions you have with your health care provider. Document Released: 02/12/2015 Document Revised: 10/06/2015 Document Reviewed: 11/17/2014 Elsevier Interactive Patient Education  2017 [employer].  Fall Prevention in the Home Falls can cause injuries. They can happen to people of all ages. There are many things you can do to make your home safe and to help prevent falls. What can I do on the outside of my home? Regularly fix the edges of walkways and driveways and fix any cracks. Remove anything that might make you trip as you walk through a door, such as a raised step or threshold. Trim any bushes or trees on the path to your home. Use bright outdoor lighting. Clear any walking paths of anything that might make someone trip, such as rocks or tools. Regularly check to see if handrails are loose or broken. Make sure that both sides of any steps have handrails. Any raised decks and porches should have guardrails on the edges. Have any leaves, snow, or ice cleared regularly. Use sand or salt on walking paths during winter. Clean up any spills in your garage right away. This includes oil or grease spills. What can I do in the bathroom? Use night lights. Install grab bars by the toilet and in the tub and shower. Do not use towel bars as grab bars. Use non-skid mats or decals in the tub or shower. If you need to sit down in the shower, use a plastic, non-slip stool. Keep the floor dry. Clean up any water that spills on the floor as soon as it happens. Remove soap buildup in the tub or shower regularly. Attach bath mats securely with double-sided non-slip rug tape. Do not have throw rugs and other things on the floor that can make you trip. What can I do in the bedroom? Use night lights. Make sure that you have a light by your bed that is easy to reach. Do not use any sheets or blankets that are too big for your bed. They should not hang down  onto the floor. Have a firm chair that has side arms. You can use this for support while you get dressed. Do not have throw rugs and other things on the floor that can make you trip. What can I do in the kitchen? Clean up any spills right away. Avoid walking on wet floors. Keep items that you use a lot in easy-to-reach places. If you need to reach something above you, use a strong step stool that has a grab bar. Keep electrical cords out of the way. Do not use floor polish or wax that makes floors slippery. If you must use wax, use non-skid floor wax. Do not have throw rugs and other things on the floor that can make you trip. What can I do with my stairs? Do not leave any items on the stairs. Make sure that there are handrails on both sides of the stairs and use them. Fix handrails that are broken or loose. Make sure that handrails are as long as the stairways. Check any carpeting to make sure that it is firmly attached to the stairs. Fix any carpet that is loose or worn. Avoid having throw rugs at the top or bottom of the stairs. If you do have throw rugs, attach them to the floor with carpet tape. Make sure that you have a light switch at the top of the stairs and the bottom  of the stairs. If you do not have them, ask someone to add them for you. What else can I do to help prevent falls? Wear shoes that: Do not have high heels. Have rubber bottoms. Are comfortable and fit you well. Are closed at the toe. Do not wear sandals. If you use a stepladder: Make sure that it is fully opened. Do not climb a closed stepladder. Make sure that both sides of the stepladder are locked into place. Ask someone to hold it for you, if possible. Clearly mark and make sure that you can see: Any grab bars or handrails. First and last steps. Where the edge of each step is. Use tools that help you move around (mobility aids) if they are needed. These include: Canes. Walkers. Scooters. Crutches. Turn  on the lights when you go into a dark area. Replace any light bulbs as soon as they burn out. Set up your furniture so you have a clear path. Avoid moving your furniture around. If any of your floors are uneven, fix them. If there are any pets around you, be aware of where they are. Review your medicines with your doctor. Some medicines can make you feel dizzy. This can increase your chance of falling. Ask your doctor what other things that you can do to help prevent falls. This information is not intended to replace advice given to you by your health care provider. Make sure you discuss any questions you have with your health care provider. Document Released: 11/12/2008 Document Revised: 06/24/2015 Document Reviewed: 02/20/2014 Elsevier Interactive Patient Education  2017 [employer].

## 2023-12-21 ENCOUNTER — Ambulatory Visit (INDEPENDENT_AMBULATORY_CARE_PROVIDER_SITE_OTHER)

## 2023-12-21 ENCOUNTER — Ambulatory Visit
Admission: EM | Admit: 2023-12-21 | Discharge: 2023-12-21 | Disposition: A | Discharge status: Home / Self Care | Attending: Family Medicine | Admitting: Family Medicine

## 2023-12-21 ENCOUNTER — Encounter: Payer: Self-pay | Admitting: *Deleted

## 2023-12-21 DIAGNOSIS — R0602 Shortness of breath: Secondary | ICD-10-CM | POA: Diagnosis not present

## 2023-12-21 DIAGNOSIS — J019 Acute sinusitis, unspecified: Secondary | ICD-10-CM | POA: Diagnosis not present

## 2023-12-21 DIAGNOSIS — J441 Chronic obstructive pulmonary disease with (acute) exacerbation: Secondary | ICD-10-CM

## 2023-12-21 MED ORDER — DOXYCYCLINE HYCLATE 100 MG PO CAPS: 100.0000 mg | ORAL_CAPSULE | Freq: Two times a day (BID) | ORAL | 0 refills | Status: AC

## 2023-12-21 MED ORDER — ALBUTEROL SULFATE HFA 108 (90 BASE) MCG/ACT IN AERS: 2.0000 | INHALATION_SPRAY | RESPIRATORY_TRACT | 0 refills | Status: AC | PRN

## 2023-12-21 MED ORDER — BENZONATATE 100 MG PO CAPS: 100.0000 mg | ORAL_CAPSULE | Freq: Three times a day (TID) | ORAL | 0 refills | Status: AC | PRN

## 2023-12-21 MED ORDER — PREDNISONE 20 MG PO TABS: 40.0000 mg | ORAL_TABLET | Freq: Every day | ORAL | 0 refills | Status: AC

## 2023-12-21 NOTE — ED Triage Notes (Addendum)
 Pt reports congested, non-productive cough with SOB x 2 weeks. Taking Robitussin. Running chills and hot for 1 week. Sob worse with exertion. Feels tight in my chest.

## 2023-12-21 NOTE — Discharge Instructions (Addendum)
 There is no pneumonia on your chest x-ray  Albuterol  inhaler--do 2 puffs every 4 hours as needed for shortness of breath or wheezing  Take prednisone  20 mg--2 daily for 5 days; this is for the inflammation in your lungs  Take doxycycline  100 mg --1 capsule 2 times daily for 7 days; this is an antibiotic for the sinus infection  Take benzonatate  100 mg, 1 tab every 8 hours as needed for cough.

## 2023-12-21 NOTE — ED Provider Notes (Signed)
 [name]    CSN: [id number] Arrival date & time: 12/21/23  1712      History   Chief Complaint Chief Complaint  Patient presents with   Cough    HPI Aaron Stevens is a 68 y.o. male.    Cough  Here for cough, nasal congestion and chest congestion and wheezing.  No fever though he has had chills throughout this whole time.  Symptoms began about 2 weeks ago.  No vomiting or nausea.  He did have some loose stools initially but that has resolved  He is allergic to sulfa   He states he does not have an inhaler at home.  Chart shows a history of COPD. Past Medical History:  Diagnosis Date   Atherosclerosis of artery    Emphysema of lung (HCC)    Pneumonia     Patient Active Problem List   Diagnosis Date Noted   Intraabdominal fluid collection 03/01/2023   Postprocedural intraabdominal abscess (HCC) 02/28/2023   Hypocalcemia 02/23/2023   Umbilical mass 02/23/2023   Thrush of mouth and esophagus (HCC) 02/23/2023   Umbilical pain 02/23/2023   Cholecystitis 02/10/2023   Normocytic anemia 02/10/2023   Cholelithiasis 02/10/2023   RUQ abdominal pain 01/26/2023   Screening cholesterol level 01/26/2023   Weight loss, unintentional 01/26/2023   Other specified diseases of liver 01/26/2023   Clubbing of nail 01/26/2023   Chronic foot pain, right 01/26/2023   Choledocholithiasis with acute cholecystitis 01/09/2023   Tobacco use 01/09/2023   Emphysema of lung (HCC)    Nuclear sclerotic cataract of right eye 05/12/2014    Past Surgical History:  Procedure Laterality Date   APPENDECTOMY     CHOLECYSTECTOMY N/A 02/16/2023   Procedure: LAPAROSCOPIC CHOLECYSTECTOMY WITH ICG;  Surgeon: Ebbie Cough, MD;  Location: [facility];  Service: General;  Laterality: N/A;   ESOPHAGOGASTRODUODENOSCOPY N/A 02/14/2023   Procedure: ESOPHAGOGASTRODUODENOSCOPY (EGD);  Surgeon: Burnette Fallow, MD;  Location: [facility];  Service: Gastroenterology;  Laterality: N/A;    EUS Left 02/14/2023   Procedure: UPPER ENDOSCOPIC ULTRASOUND (EUS) LINEAR;  Surgeon: Burnette Fallow, MD;  Location: [facility];  Service: Gastroenterology;  Laterality: Left;   IR EXCHANGE BILIARY DRAIN  02/12/2023   IR PERC CHOLECYSTOSTOMY  01/12/2023   IR RADIOLOGIST EVAL & MGMT  03/14/2023   IR SINUS/FIST TUBE CHK-NON GI  01/25/2023   KNEE SURGERY         Home Medications    Prior to Admission medications   Medication Sig Start Date End Date Taking? Authorizing Provider  albuterol  (VENTOLIN  HFA) 108 (90 Base) MCG/ACT inhaler Inhale 2 puffs into the lungs every 4 (four) hours as needed for wheezing or shortness of breath. 12/21/23  Yes Vonna Sharlet POUR, MD  benzonatate  (TESSALON ) 100 MG capsule Take 1 capsule (100 mg total) by mouth 3 (three) times daily as needed for cough. 12/21/23  Yes Vonna Sharlet POUR, MD  doxycycline  (VIBRAMYCIN ) 100 MG capsule Take 1 capsule (100 mg total) by mouth 2 (two) times daily for 7 days. 12/21/23 12/28/23 Yes Vonna Sharlet POUR, MD  gabapentin  (NEURONTIN ) 300 MG capsule Take 600 mg by mouth at bedtime.   Yes [provider]  predniSONE  (DELTASONE ) 20 MG tablet Take 2 tablets (40 mg total) by mouth daily with breakfast for 5 days. 12/21/23 12/26/23 Yes Yesenia Locurto K, MD  cloNIDine (CATAPRES) 0.1 MG tablet Take 0.1 mg by mouth as directed. Patient not taking: Reported on 12/21/2023    [provider]  Diclofenac Sod-Lido HCl, Top, (  DICLOFENAC SOD-LIDOCAINE  HCL EX) Apply 1-2 g topically. Patient not taking: Reported on 12/21/2023 12/17/19   [provider]  levETIRAcetam (KEPPRA) 250 MG tablet Take 250 mg by mouth 2 (two) times daily. Patient not taking: Reported on 03/01/2023    [provider]  methocarbamol  (ROBAXIN ) 500 MG tablet Take 1 tablet (500 mg total) by mouth 3 (three) times daily. Patient taking differently: Take 500 mg by mouth 2 (two) times daily as needed for muscle spasms. 02/23/23   Crain, Whitney  L, PA  mometasone -formoterol  (DULERA ) 100-5 MCG/ACT AERO Inhale 2 puffs into the lungs 2 (two) times daily. Patient not taking: Reported on 03/01/2023 02/18/23   Lenon Marien CROME, MD  nystatin  (MYCOSTATIN ) 100000 UNIT/ML suspension Take 5 mLs (500,000 Units total) by mouth 4 (four) times daily. Swish for 90 seconds and swallow Patient not taking: Reported on 03/01/2023 02/23/23   Lowella Folks L, PA  oxyCODONE  (OXY IR/ROXICODONE ) 5 MG immediate release tablet Take 1 tablet (5 mg total) by mouth every 6 (six) hours as needed for severe pain (pain score 7-10). Patient taking differently: Take 5 mg by mouth daily as needed for severe pain (pain score 7-10). 02/23/23   Crain, Whitney L, PA  sodium chloride  flush (NS) 0.9 % SOLN 5 mLs by Intracatheter route every 12 (twelve) hours. Patient not taking: Reported on 06/20/2023 03/02/23   Tammy Sor, PA-C  traMADol  (ULTRAM ) 50 MG tablet Take 50 mg by mouth 2 (two) times daily. Patient not taking: Reported on 12/21/2023 05/10/23   [provider]    Family History History reviewed. No pertinent family history.  Social History Social History   Tobacco Use   Smoking status: Former    Types: Cigarettes   Smokeless tobacco: Never   Tobacco comments:    Quit Feb 10, 2023  Vaping Use   Vaping status: Never Used  Substance Use Topics   Alcohol use: No   Drug use: No    Comment: CBD     Allergies   Bee venom, Honey bee venom, and Sulfa  antibiotics   Review of Systems Review of Systems  Respiratory:  Positive for cough.      Physical Exam Triage Vital Signs ED Triage Vitals  Encounter Vitals Group     BP 12/21/23 1727 137/75     Girls Systolic BP Percentile --      Girls Diastolic BP Percentile --      Boys Systolic BP Percentile --      Boys Diastolic BP Percentile --      Pulse --      Resp 12/21/23 1727 (!) 22     Temp 12/21/23 1727 98.3 F (36.8 C)     Temp Source 12/21/23 1727 Oral     SpO2 12/21/23 1727 92 %      Weight --      Height --      Head Circumference --      Peak Flow --      Pain Score 12/21/23 1724 0     Pain Loc --      Pain Education --      Exclude from Growth Chart --    No data found.  Updated Vital Signs BP 137/75 (BP Location: Left Arm)   Temp 98.3 F (36.8 C) (Oral)   Resp (!) 22   SpO2 92%   Visual Acuity Right Eye Distance:   Left Eye Distance:   Bilateral Distance:    Right Eye Near:  Left Eye Near:    Bilateral Near:     Physical Exam Vitals reviewed.  Constitutional:      General: He is not in acute distress.    Appearance: He is not ill-appearing, toxic-appearing or diaphoretic.  HENT:     Right Ear: Tympanic membrane and ear canal normal.     Left Ear: Tympanic membrane and ear canal normal.     Nose: Nose normal.     Mouth/Throat:     Mouth: Mucous membranes are moist.     Pharynx: No oropharyngeal exudate or posterior oropharyngeal erythema.  Eyes:     Extraocular Movements: Extraocular movements intact.     Conjunctiva/sclera: Conjunctivae normal.     Pupils: Pupils are equal, round, and reactive to light.  Cardiovascular:     Rate and Rhythm: Normal rate and regular rhythm.     Heart sounds: No murmur heard. Pulmonary:     Effort: No respiratory distress.     Breath sounds: No stridor. No rhonchi or rales.     Comments: He does have end expiratory wheezes, mainly on the right.  The lung sounds are not as audible on the left. Musculoskeletal:     Cervical back: Neck supple.  Lymphadenopathy:     Cervical: No cervical adenopathy.  Skin:    Capillary Refill: Capillary refill takes less than 2 seconds.     Coloration: Skin is not jaundiced or pale.  Neurological:     General: No focal deficit present.     Mental Status: He is alert and oriented to person, place, and time.  Psychiatric:        Behavior: Behavior normal.      UC Treatments / Results  Labs (all labs ordered are listed, but only abnormal results are displayed) Labs  Reviewed - No data to display  EKG   Radiology DG Chest 2 View Result Date: 12/21/2023 CLINICAL DATA:  Chest congestion EXAM: CHEST - 2 VIEW COMPARISON:  Chest x-ray 01/11/2023 FINDINGS: The heart size and mediastinal contours are within normal limits. The lungs are hyperinflated, unchanged. Both lungs are clear. The visualized skeletal structures are unremarkable. IMPRESSION: 1. No active cardiopulmonary disease. 2. Hyperinflation of the lungs, unchanged. Electronically Signed   By: Greig Pique M.D.   On: 12/21/2023 18:18    Procedures Procedures (including critical care time)  Medications Ordered in UC Medications - No data to display  Initial Impression / Assessment and Plan / UC Course  I have reviewed the triage vital signs and the nursing notes.  Pertinent labs & imaging results that were available during my care of the patient were reviewed by me and considered in my medical decision making (see chart for details).     There is no infiltrate on the cxr  Doxycycline  is sent in for possible sinusitis, and albuterol  and prednisone  are sent in for the COPD exacerbation. Final Clinical Impressions(s) / UC Diagnoses   Final diagnoses:  Shortness of breath  COPD exacerbation (HCC)  Acute sinusitis, recurrence not specified, unspecified location     Discharge Instructions      There is no pneumonia on your chest x-ray  Albuterol  inhaler--do 2 puffs every 4 hours as needed for shortness of breath or wheezing  Take prednisone  20 mg--2 daily for 5 days; this is for the inflammation in your lungs  Take doxycycline  100 mg --1 capsule 2 times daily for 7 days; this is an antibiotic for the sinus infection  Take benzonatate  100 mg, 1  tab every 8 hours as needed for cough.      ED Prescriptions     Medication Sig Dispense Auth. Provider   albuterol  (VENTOLIN  HFA) 108 (90 Base) MCG/ACT inhaler Inhale 2 puffs into the lungs every 4 (four) hours as needed for wheezing or  shortness of breath. 1 each Vonna Sharlet POUR, MD   predniSONE  (DELTASONE ) 20 MG tablet Take 2 tablets (40 mg total) by mouth daily with breakfast for 5 days. 10 tablet Vonna Sharlet POUR, MD   doxycycline  (VIBRAMYCIN ) 100 MG capsule Take 1 capsule (100 mg total) by mouth 2 (two) times daily for 7 days. 14 capsule Vonna Sharlet POUR, MD   benzonatate  (TESSALON ) 100 MG capsule Take 1 capsule (100 mg total) by mouth 3 (three) times daily as needed for cough. 21 capsule Anayiah Howden K, MD      I have reviewed the PDMP during this encounter.   Vonna Sharlet POUR, MD 12/21/23 [id number]

## 2023-12-30 ENCOUNTER — Ambulatory Visit: Payer: Self-pay

## 2024-01-20 ENCOUNTER — Ambulatory Visit: Payer: Self-pay

## 2024-01-22 ENCOUNTER — Ambulatory Visit: Payer: Self-pay

## 2024-02-01 ENCOUNTER — Emergency Department (HOSPITAL_COMMUNITY)
Admission: EM | Admit: 2024-02-01 | Discharge: 2024-02-01 | Disposition: A | Discharge status: Home / Self Care | Payer: Worker's Compensation | Source: Home / Self Care

## 2024-02-01 DIAGNOSIS — M79604 Pain in right leg: Secondary | ICD-10-CM

## 2024-02-01 DIAGNOSIS — M25571 Pain in right ankle and joints of right foot: Secondary | ICD-10-CM | POA: Diagnosis present

## 2024-02-01 DIAGNOSIS — M25572 Pain in left ankle and joints of left foot: Secondary | ICD-10-CM | POA: Diagnosis not present

## 2024-02-01 MED ORDER — OXYCODONE-ACETAMINOPHEN 5-325 MG PO TABS
1.0000 | ORAL_TABLET | Freq: Once | ORAL | Status: AC
  Administered 2024-02-01: 1 via ORAL
  Filled 2024-02-01: qty 1

## 2024-02-01 NOTE — ED Triage Notes (Signed)
 C/o bilateral ankle pain moving up his legs for one month. No pain, redness, swelling, or injury per patient.

## 2024-02-01 NOTE — ED Notes (Signed)
 Pt ambulatory without assistance to the fast track room

## 2024-02-01 NOTE — ED Provider Notes (Signed)
 " [place] [facility] AT [facility] Provider Note   CSN: [id number] Arrival date & time: 02/01/24  1324     Patient presents with: Ankle Pain   Aaron Stevens is a 69 y.o. male.   The history is provided by the patient, the spouse and medical records. No language interpreter was used.  Ankle Pain Associated symptoms: no fever      69 year old male with significant history of complex regional pain syndrome who presenting with complaint of leg pain.  Patient has had recurrent pain primarily involving his right ankle and foot as well as left leg.  At home he is treating with CBD/THS derivative as well as gabapentin .  His symptoms ongoing for many years but this is getting progressively worse.  He is not complaining of increasing pain left greater than right involving both feet.  He does not endorse any recent injury no fever no numbness no chest pain or shortness of breath.  He does follow-up with a pain specialist and was seen by the specialist 3 weeks ago.  His pain is present both at rest and with movement.  Prior to Admission medications  Medication Sig Start Date End Date Taking? Authorizing Provider  albuterol  (VENTOLIN  HFA) 108 (90 Base) MCG/ACT inhaler Inhale 2 puffs into the lungs every 4 (four) hours as needed for wheezing or shortness of breath. 12/21/23   Vonna Sharlet POUR, MD  benzonatate  (TESSALON ) 100 MG capsule Take 1 capsule (100 mg total) by mouth 3 (three) times daily as needed for cough. 12/21/23   Banister, Pamela K, MD  cloNIDine (CATAPRES) 0.1 MG tablet Take 0.1 mg by mouth as directed. Patient not taking: Reported on 12/21/2023    [provider]  Diclofenac Sod-Lido HCl, Top, (DICLOFENAC SOD-LIDOCAINE  HCL EX) Apply 1-2 g topically. Patient not taking: Reported on 12/21/2023 12/17/19   [provider]  gabapentin  (NEURONTIN ) 300 MG capsule Take 600 mg by mouth at bedtime.    [provider]  levETIRAcetam  (KEPPRA) 250 MG tablet Take 250 mg by mouth 2 (two) times daily. Patient not taking: Reported on 03/01/2023    [provider]  methocarbamol  (ROBAXIN ) 500 MG tablet Take 1 tablet (500 mg total) by mouth 3 (three) times daily. Patient taking differently: Take 500 mg by mouth 2 (two) times daily as needed for muscle spasms. 02/23/23   Crain, Whitney L, PA  mometasone -formoterol  (DULERA ) 100-5 MCG/ACT AERO Inhale 2 puffs into the lungs 2 (two) times daily. Patient not taking: Reported on 03/01/2023 02/18/23   Lenon Marien CROME, MD  nystatin  (MYCOSTATIN ) 100000 UNIT/ML suspension Take 5 mLs (500,000 Units total) by mouth 4 (four) times daily. Swish for 90 seconds and swallow Patient not taking: Reported on 03/01/2023 02/23/23   Lowella Folks L, PA  oxyCODONE  (OXY IR/ROXICODONE ) 5 MG immediate release tablet Take 1 tablet (5 mg total) by mouth every 6 (six) hours as needed for severe pain (pain score 7-10). Patient taking differently: Take 5 mg by mouth daily as needed for severe pain (pain score 7-10). 02/23/23   Crain, Whitney L, PA  sodium chloride  flush (NS) 0.9 % SOLN 5 mLs by Intracatheter route every 12 (twelve) hours. Patient not taking: Reported on 06/20/2023 03/02/23   Tammy Sor, PA-C  traMADol  (ULTRAM ) 50 MG tablet Take 50 mg by mouth 2 (two) times daily. Patient not taking: Reported on 12/21/2023 05/10/23   [provider]    Allergies: Bee venom, Honey bee venom, and Sulfa  antibiotics  Review of Systems  Constitutional:  Negative for fever.  Musculoskeletal:  Positive for arthralgias.  Skin:  Negative for wound.    Updated Vital Signs BP 139/84 (BP Location: Left Arm)   Pulse 92   Temp 97.6 F (36.4 C) (Oral)   Resp 17   SpO2 99%   Physical Exam Constitutional:      General: He is not in acute distress.    Appearance: He is well-developed.  HENT:     Head: Atraumatic.  Eyes:     Conjunctiva/sclera: Conjunctivae normal.  Musculoskeletal:         General: Tenderness (Tenderness to palpation of both feet and ankle.  Pedal pulse palpable with intact cap refills bilaterally.  No erythema edema or warmth appreciated.  No deformity noted) present.     Cervical back: Normal range of motion and neck supple.     Comments: Legs compartment soft bilaterally.  Skin:    Findings: No rash.  Neurological:     Mental Status: He is alert.     (all labs ordered are listed, but only abnormal results are displayed) Labs Reviewed - No data to display  EKG: None  Radiology: No results found.   Procedures   Medications Ordered in the ED  oxyCODONE -acetaminophen  (PERCOCET/ROXICET) 5-325 MG per tablet 1 tablet (has no administration in time range)                                    Medical Decision Making  BP 139/84 (BP Location: Left Arm)   Pulse 92   Temp 97.6 F (36.4 C) (Oral)   Resp 17   SpO2 99%   43:2 PM 69 year old male with significant history of complex regional pain syndrome who presenting with complaint of leg pain.  Patient has had recurrent pain primarily involving his right ankle and foot as well as left leg.  At home he is treating with CBD/THS derivative as well as gabapentin .  His symptoms ongoing for many years but this is getting progressively worse.  He is not complaining of increasing pain left greater than right involving both feet.  He does not endorse any recent injury no fever no numbness no chest pain or shortness of breath.  He does follow-up with a pain specialist and was seen by the specialist 3 weeks ago.  His pain is present both at rest and with movement.  On exam patient has tenderness to both feet and ankle however I do not appreciate any signs of infection noted.  No signs of trauma.  He is neurovascularly intact.  His leg compartments are soft.  I have considered ultrasounds and x-rays for evaluation but due to his acute on chronic pain and no concerning finding on exam, I will provide patient with a  dose of pain medication here however he should follow-up closely with his pain specialist for further management of his chronic condition.     Final diagnoses:  Lower extremity pain, bilateral    ED Discharge Orders     None          Nivia Colon, PA-C 02/01/24 1527    Neysa Caron PARAS, DO 02/01/24 1706  "

## 2024-02-01 NOTE — Discharge Instructions (Signed)
 It is important for you to follow-up closely with your pain specialist for further managements of your recurrent pain to your lower extremities.  Continue to take your gabapentin  and home remedy for your symptom control.  Return if you develop fever, rash, weakness, or if you have other concern.

## 2024-06-25 ENCOUNTER — Encounter
